# Patient Record
Sex: Male | Born: 1935 | Race: Black or African American | Hispanic: No | Marital: Married | Smoking: Former smoker
Health system: Southern US, Community
[De-identification: ages and names within clinical notes are randomized; demographics above are authoritative.]

## PROBLEM LIST (undated history)

## (undated) DIAGNOSIS — R51 Headache: Secondary | ICD-10-CM

## (undated) DIAGNOSIS — I2510 Atherosclerotic heart disease of native coronary artery without angina pectoris: Secondary | ICD-10-CM

## (undated) DIAGNOSIS — R519 Headache, unspecified: Secondary | ICD-10-CM

## (undated) DIAGNOSIS — E119 Type 2 diabetes mellitus without complications: Secondary | ICD-10-CM

## (undated) DIAGNOSIS — I447 Left bundle-branch block, unspecified: Secondary | ICD-10-CM

## (undated) DIAGNOSIS — I10 Essential (primary) hypertension: Secondary | ICD-10-CM

## (undated) DIAGNOSIS — E785 Hyperlipidemia, unspecified: Secondary | ICD-10-CM

## (undated) DIAGNOSIS — M1990 Unspecified osteoarthritis, unspecified site: Secondary | ICD-10-CM

## (undated) DIAGNOSIS — I739 Peripheral vascular disease, unspecified: Secondary | ICD-10-CM

## (undated) HISTORY — DX: Peripheral vascular disease, unspecified: I73.9

## (undated) HISTORY — DX: Left bundle-branch block, unspecified: I44.7

## (undated) HISTORY — PX: CORONARY ANGIOPLASTY WITH STENT PLACEMENT: SHX49

## (undated) HISTORY — PX: BACK SURGERY: SHX140

## (undated) HISTORY — DX: Atherosclerotic heart disease of native coronary artery without angina pectoris: I25.10

## (undated) HISTORY — DX: Hyperlipidemia, unspecified: E78.5

## (undated) HISTORY — DX: Essential (primary) hypertension: I10

## (undated) HISTORY — PX: CATARACT EXTRACTION W/ INTRAOCULAR LENS  IMPLANT, BILATERAL: SHX1307

---

## 2008-03-10 ENCOUNTER — Ambulatory Visit: Payer: Self-pay | Admitting: Gastroenterology

## 2008-03-24 ENCOUNTER — Ambulatory Visit: Payer: Self-pay | Admitting: Gastroenterology

## 2008-03-24 ENCOUNTER — Encounter: Payer: Self-pay | Admitting: Gastroenterology

## 2008-03-28 ENCOUNTER — Encounter: Payer: Self-pay | Admitting: Gastroenterology

## 2009-12-25 ENCOUNTER — Ambulatory Visit (HOSPITAL_COMMUNITY): Admission: RE | Admit: 2009-12-25 | Discharge: 2009-12-25 | Payer: Self-pay | Admitting: Family Medicine

## 2010-01-01 HISTORY — PX: LUMBAR LAMINECTOMY/DECOMPRESSION MICRODISCECTOMY: SHX5026

## 2010-01-23 ENCOUNTER — Inpatient Hospital Stay (HOSPITAL_COMMUNITY): Admission: RE | Admit: 2010-01-23 | Discharge: 2010-01-24 | Payer: Self-pay | Admitting: Neurosurgery

## 2010-08-17 LAB — URINALYSIS, ROUTINE W REFLEX MICROSCOPIC
Protein, ur: NEGATIVE mg/dL
Specific Gravity, Urine: 1.025 (ref 1.005–1.030)

## 2010-08-17 LAB — COMPREHENSIVE METABOLIC PANEL
ALT: 18 U/L (ref 0–53)
AST: 25 U/L (ref 0–37)
Albumin: 4.3 g/dL (ref 3.5–5.2)
BUN: 19 mg/dL (ref 6–23)
CO2: 29 mEq/L (ref 19–32)
Calcium: 9.9 mg/dL (ref 8.4–10.5)
GFR calc non Af Amer: >60 mL/min (ref >60)
Glucose, Bld: 116 mg/dL — ABNORMAL HIGH (ref 70–99)
Potassium: 4.7 mEq/L (ref 3.5–5.1)
Total Bilirubin: 1 mg/dL (ref 0.3–1.2)

## 2010-08-17 LAB — CBC
HCT: 41.9 % (ref 39.0–52.0)
MCHC: 32.7 g/dL (ref 30.0–36.0)
Platelets: 389 10*3/uL (ref 150–400)
RBC: 4.59 MIL/uL (ref 4.22–5.81)
WBC: 5.8 10*3/uL (ref 4.0–10.5)

## 2010-08-17 LAB — DIFFERENTIAL
Monocytes Relative: 9 % (ref 3–12)
Neutrophils Relative %: 57 % (ref 43–77)

## 2010-08-21 ENCOUNTER — Ambulatory Visit (HOSPITAL_COMMUNITY)
Admission: RE | Admit: 2010-08-21 | Discharge: 2010-08-22 | Disposition: A | Discharge status: Home / Self Care | Payer: Medicare Other | Source: Ambulatory Visit | Attending: Cardiology | Admitting: Cardiology

## 2010-08-21 DIAGNOSIS — I2510 Atherosclerotic heart disease of native coronary artery without angina pectoris: Secondary | ICD-10-CM | POA: Insufficient documentation

## 2010-08-21 DIAGNOSIS — I10 Essential (primary) hypertension: Secondary | ICD-10-CM | POA: Insufficient documentation

## 2010-08-21 DIAGNOSIS — E785 Hyperlipidemia, unspecified: Secondary | ICD-10-CM | POA: Insufficient documentation

## 2010-08-21 LAB — POCT ACTIVATED CLOTTING TIME: Activated Clotting Time: 452 seconds

## 2010-08-22 ENCOUNTER — Encounter: Payer: Self-pay | Admitting: Nurse Practitioner

## 2010-08-22 DIAGNOSIS — I739 Peripheral vascular disease, unspecified: Secondary | ICD-10-CM

## 2010-08-22 DIAGNOSIS — E785 Hyperlipidemia, unspecified: Secondary | ICD-10-CM

## 2010-08-22 DIAGNOSIS — I2510 Atherosclerotic heart disease of native coronary artery without angina pectoris: Secondary | ICD-10-CM

## 2010-08-22 DIAGNOSIS — I10 Essential (primary) hypertension: Secondary | ICD-10-CM | POA: Insufficient documentation

## 2010-08-22 LAB — BASIC METABOLIC PANEL
CO2: 27 mEq/L (ref 19–32)
Creatinine, Ser: 1.11 mg/dL (ref 0.4–1.5)
GFR calc Af Amer: >60 mL/min (ref >60)
Glucose, Bld: 115 mg/dL — ABNORMAL HIGH (ref 70–99)
Sodium: 138 mEq/L (ref 135–145)

## 2010-08-22 LAB — CBC
HCT: 35.8 % — ABNORMAL LOW (ref 39.0–52.0)
MCH: 29.2 pg (ref 26.0–34.0)
MCV: 90.9 fL (ref 78.0–100.0)
Platelets: 367 10*3/uL (ref 150–400)
RBC: 3.94 MIL/uL — ABNORMAL LOW (ref 4.22–5.81)
RDW: 13.1 % (ref 11.5–15.5)

## 2010-08-27 NOTE — Discharge Summary (Signed)
  NAME:  Joseph, Byrd           ACCOUNT NO.:  [id number]  MEDICAL RECORD NO.:  [id number]           PATIENT TYPE:  O  LOCATION:  6526                         FACILITY:  [facility]  PHYSICIAN:  Cristy Hilts. Jacinto Halim, MD       DATE OF BIRTH:  Sep 30, 1933  DATE OF ADMISSION:  08/21/2010 DATE OF DISCHARGE:  08/22/2010                              DISCHARGE SUMMARY   DISCHARGE DIAGNOSES: 1. Coronary artery disease status post PTCA and successful stenting of     the mid LAD with implantation of a 2.25 x 22 mm Resolute drug-     eluting stent with  reduction of stenosis from 90% to 0%.  Residual     diffuse severe disease in the dominant distal circumflex coronary     artery.  Small vessel distally with diffuse disease medical therapy     indicated.  No significant disease in this ostial LAD by IVUS.     Preserved normal left ventricular systolic function. 2  Hypertension. 1. Mixed hyperlipidemia.  RECOMMENDATIONS:  The patient will be discharged home today as he has remained stable and is doing well.  His discharge medications will include: 1. Aspirin 81 mg p.o. daily. 2. Tramadol 50 mg p.o. q.6 h. p.r.n. 3. Niacin 500 mg p.o. at bedtime. 4. Lipitor 20 mg p.o. daily. 5. Imdur 60 mg p.o. daily. 6. Prasugrel 10 mg p.o. daily. 7. Diovan HCT 320/25 one p.o. daily. 8. He will stop Aleve and Naprosyn at least for a duration of 3     months.  The patient will follow up with me in 2-3 weeks, and he will continue to follow with Dr. Rudi Heap for his primary care needs.  BRIEF HISTORY:  [name] is a pleasant 77 year old gentleman who is referred to me for evaluation of chest pain and markedly abnormal treadmill stress test.  He underwent cardiac catheterization yesterday that is August 21, 2010, and was found to have high-grade stenosis in the mid LAD.  IVUS was performed to exclude the severe disease in his ostial LAD.  He underwent successful stenting of the mid LAD.  He  tolerated the procedure well without any significant complication.  Next day, he was found to be stable for discharge.     Cristy Hilts. Jacinto Halim, MD     JRG/MEDQ  D:  08/22/2010  T:  08/23/2010  Job:  [id number]  cc:   Ernestina Penna, M.D.  Electronically Signed by Yates Decamp MD on 08/27/2010 09:58:24 AM

## 2010-08-27 NOTE — Procedures (Signed)
NAME:  Joseph Byrd, Joseph Byrd           ACCOUNT NO.:  [id number]  MEDICAL RECORD NO.:  [id number]           PATIENT TYPE:  O  LOCATION:  [facility]                         FACILITY:  [facility]  PHYSICIAN:  Vonna Kotyk R. Jacinto Halim, MD       DATE OF BIRTH:  [birth year]  DATE OF PROCEDURE:  08/21/2010 DATE OF DISCHARGE:                           CARDIAC CATHETERIZATION   PROCEDURE PERFORMED:  Elective left heart catheterization including; 1. Left ventriculography. 2. Selective right and left coronary aortography. 3. Intravascular ultrasound interrogation of the ostial proximal left     anterior descending. 4. Percutaneous transluminal coronary angioplasty and stenting of the     mid left anterior descending with implantation of a 2.25 x 22 mm     Resolute drug-eluting stent.  INDICATION:  [name] is a 77 year old gentleman with history of hypertension and hyperlipidemia who has been complaining of exertional chest discomfort.  He had undergone outpatient treadmill exercise stress test by Dr. Rudi Heap and was found to have significant ST depression with low level of exercise activity associated with chest discomfort and development of left bundle-branch block. Given the abnormal treadmill stress test and his chest pain suggestive of angina pectoris, he is now brought to the cardiac catheterization lab to evaluate his coronary anatomy.  HEMODYNAMIC DATA:  The left ventricular pressure was 122/10 with end- diastolic pressure of 0 mmHg.  Aortic pressure was 137/76 with a mean of 99 mmHg.  There was no pressure gradient cross the aortic valve.  ANGIOGRAPHIC DATA:  Left ventricle.  Left ventricular systolic function was normal with the ejection fraction of 60%. Right coronary artery.  The right coronary artery has an anterior origin.  It is nondominant. Left main coronary artery.  The left main coronary artery is a large- caliber vessel and has mild diffuse luminal irregularity. Circumflex  coronary artery.  The circumflex coronary artery is a dominant vessel giving origin to a large obtuse marginal 1 and distally it gives origin to PDA, which is severely diffusely diseased constituting anywhere from 50-80% stenoses.  The distal circumflex rapidly tapers off.  The obtuse marginal shows mild diffuse disease. Ramus intermediate.  The ramus intermedius is a moderate-to-large caliber vessel.  Mid segment of the ramus intermediate bifurcates and the second branch has a 40-50% stenoses, which appears to be eccentric. LAD.  The LAD is a large-caliber vessel with severe diffuse calcification of the proximal segment.  Ostium of the LAD appears to be hazy with a 60-70% stenoses or higher in certain views.  The mid LAD shows diffuse luminal irregularity followed by a high-grade 80-90% stenoses.  INTERVENTION DATA:  IVUS:  The IVUS interrogation of the proximal LAD revealed lumen area to be about close to 8-9 mm sq.  At the ostium, at the tightest stenotic segment, the lumen area measured 5 mm sq.  This constituted approximately about 50% stenoses of the ostium of the LAD. There was circumferential calcification noted constituting at least more than 60-70 degrees of calcification throughout the proximal and ostial LAD.  IVUS data post intervention to the mid LAD after stent implantation revealed excellent position of the stent.  Intervention  data of successful PTCA and stenting of the mid LAD with implantation of a 2.24 x 22 mm, there was a looped stent, which was deployed at 10 atmospheric pressure for 60 seconds followed by post dilatation with a 2.25 x 12 mm Sprinter Williams at 16 atmospheric pressure from 30-40 seconds.  Multiple inflations were performed throughout the stented segment.  Following this,  intravascular ultrasound interrogation revealed excellent wall apposition of the stent.  There were no residual stenoses.  Severe calcification and plaque burden was evident in this  segment of the LAD.  RECOMMENDATIONS:  The patient will be continued on aggressive risk modification.  He probably will need to change his statin or increase the dose of the statin.  The beta-blockers cannot be used as he is significantly bradycardic with the heart rate around 40s to 50s while in the cath lab.  A total of 185 mL of contrast was utilized for diagnostic angiography and intervention procedure.  TECHNIQUE OF THE PROCEDURE:  Under sterile precautions using a 6-French right radial access, a 6-French TIG #4 catheter was advanced into the ascending aorta, and selective left and right coronary aortography was performed.  Catheter was then pulled out of the body over an exchange length J-wire.  A 5-French pigtail catheter was utilized to perform left ventriculography in the RAO projection.  Catheter exchange was then done over a J-wire.  TECHNIQUE OF INTERVENTION:  Using Angiomax for anticoagulation an XB3.5 LAD guide catheter was utilized to engage the left main coronary artery. Using a Cougar guidewire, I was able to cross the proximal LAD. However, I had great difficulty in crossing the mid LAD stenoses, hence the lesion was not crossed for IVUS.  Proximal LAD and left main coronary artery IVUS was performed using Galaxy IVUS catheter using Angiomax for anticoagulation.  Having performed and confirmed that the LAD ostial stenosis is not significant, we proceeded with intervention and angioplasty of the mid LAD.  A 2.0 x 12 mm Sprinter balloon was utilized and with the help of a backup support with again moderate amount of difficulty, I was able to cross the mid LAD stenosis.  Having crossed this, then I used the Sprinter balloon to perform angioplasty at 9 atmospheric  pressure for about 1 minute.  Having performed this, we stented this with a 2.24 x 22 mm Resolute stent, which was deployed at 10 atmospheric pressure and postdilatation was performed with a 2.25 x 12 mm  Sprinter  with a peak of 16 atmospheric pressure.  Postdilatation was performed throughout the stented segment.  Post stent implantation, IVUS confirmed good results. The guidewire was withdrawn angiography.  Guide catheter disengaged and pulled out of the body.  During the procedure, intracoronary nitroglycerin was administered at various levels pre and post intervention.  The patient tolerated the procedure well.     Cristy Hilts. Jacinto Halim, MD     JRG/MEDQ  D:  08/21/2010  T:  08/21/2010  Job:  [id number]  cc:   Ernestina Penna, M.D.  Electronically Signed by Yates Decamp MD on 08/27/2010 09:58:17 AM

## 2012-09-03 ENCOUNTER — Ambulatory Visit (INDEPENDENT_AMBULATORY_CARE_PROVIDER_SITE_OTHER): Payer: Medicare Other | Admitting: Family Medicine

## 2012-09-03 ENCOUNTER — Encounter: Payer: Self-pay | Admitting: Family Medicine

## 2012-09-03 VITALS — BP 135/80 | HR 53 | Temp 97.3°F | Ht 69.0 in | Wt 195.4 lb

## 2012-09-03 DIAGNOSIS — I739 Peripheral vascular disease, unspecified: Secondary | ICD-10-CM

## 2012-09-03 DIAGNOSIS — I10 Essential (primary) hypertension: Secondary | ICD-10-CM

## 2012-09-03 DIAGNOSIS — I2510 Atherosclerotic heart disease of native coronary artery without angina pectoris: Secondary | ICD-10-CM

## 2012-09-03 DIAGNOSIS — R739 Hyperglycemia, unspecified: Secondary | ICD-10-CM

## 2012-09-03 DIAGNOSIS — E785 Hyperlipidemia, unspecified: Secondary | ICD-10-CM

## 2012-09-03 DIAGNOSIS — R7309 Other abnormal glucose: Secondary | ICD-10-CM

## 2012-09-03 LAB — BASIC METABOLIC PANEL WITH GFR
BUN: 16 mg/dL (ref 6–23)
CO2: 29 mEq/L (ref 19–32)
Calcium: 9.3 mg/dL (ref 8.4–10.5)
Chloride: 108 mEq/L (ref 96–112)
Creat: 1.05 mg/dL (ref 0.50–1.35)
GFR, Est African American: 79 mL/min
GFR, Est Non African American: 69 mL/min
Glucose, Bld: 116 mg/dL — ABNORMAL HIGH (ref 70–99)
Potassium: 4 mEq/L (ref 3.5–5.3)
Sodium: 141 mEq/L (ref 135–145)

## 2012-09-03 LAB — HEPATIC FUNCTION PANEL
ALT: 15 U/L (ref 0–53)
AST: 29 U/L (ref 0–37)
Albumin: 4.4 g/dL (ref 3.5–5.2)
Alkaline Phosphatase: 61 U/L (ref 39–117)
Bilirubin, Direct: 0.2 mg/dL (ref 0.0–0.3)
Indirect Bilirubin: 0.7 mg/dL (ref 0.0–0.9)
Total Bilirubin: 0.9 mg/dL (ref 0.3–1.2)
Total Protein: 6.6 g/dL (ref 6.0–8.3)

## 2012-09-03 LAB — POCT GLYCOSYLATED HEMOGLOBIN (HGB A1C): Hemoglobin A1C: 5

## 2012-09-03 NOTE — Progress Notes (Signed)
Patient ID: Joseph Byrd, male   DOB: 11-Jul-1933, 79 y.o.   MRN: [id number] SUBJECTIVE:   HPI: New to me. Came to establish with a new provider in [facility]. Stable. No Chest Pain, no Claudication, No SOB, no Pedal edema, No palpitations. BP tends to be 130/80. No Headache. Tries to walk for exercise. Tries to eat healthy. Limits meat.   PMH/PSH: reviewed/updated in Epic  SH/FH: reviewed/updated in Epic  Allergies: reviewed/updated in Epic  Medications: reviewed/updated in Epic  Immunizations: reviewed/updated in Epic    ROS: No Complaints.  OBJECTIVE:   African American Male.  On examination he appeared in good health and spirits. No distress. Anicteric, Acyanotic Vital signs as documented. BP 135/80  Pulse 53  Temp(Src) 97.3 F (36.3 C) (Oral)  Ht 5\' 9"  (1.753 m)  Wt 195 lb 6.4 oz (88.633 kg)  BMI 28.84 kg/m2  SpO2 95%  Skin warm and dry and without overt rashes.  Head,Ears,Eyes,Throat: normal Neck without JVD.  Lungs clear.  Heart exam notable for regular rhythm, normal sounds and absence of murmurs, rubs or gallops. Abdomen unremarkable and without evidence of organomegaly, masses, or abdominal aortic enlargement. NF:[id number] Extremities nonedematous.Peripheral pulses faint in the legs. Neurologic:nonfocal  ASSESSMENT:  HLD (hyperlipidemia) - Plan: NMR Lipoprofile with Lipids, Hepatic function panel  HTN (hypertension) - Plan: BASIC METABOLIC PANEL WITH GFR  Hyperglycemia - Plan: POCT glycosylated hemoglobin (Hb A1C)  Hypertension, benign essential, age 30-18  PVD (peripheral vascular disease) with claudication  CAD, multiple vessel  Hyperlipidemia LDL goal < 70  Discusses medical problems. They're stable at present. No symptomatology. Also, counseled on a healthy lifestyle and being on a vegan diet.  PLAN: Orders Placed This Encounter  Procedures  . BASIC METABOLIC PANEL WITH GFR  . NMR Lipoprofile with Lipids  . Hepatic function panel  . POCT  glycosylated hemoglobin (Hb A1C)   No results found for this or any previous visit (from the past 24 hour(s)).                                Meds ordered this encounter  Medications  . carvedilol (COREG) 3.125 MG tablet    Sig:   . cilostazol (PLETAL) 100 MG tablet    Sig:   . clopidogrel (PLAVIX) 75 MG tablet    Sig:   . isosorbide mononitrate (IMDUR) 60 MG 24 hr tablet    Sig:   . TRAVATAN Z 0.004 % SOLN ophthalmic solution    Sig:   Diet and Exercise discussed with patient. For nutrition information, I recommend books: Eat to Live by Dr Monico Hoar. Prevent and Reverse Heart Disease by Dr Suzzette Righter.  Exercise recommendations are:  If unable to walk, then the patient can exercise in a chair 3 times a day. By flapping arms like a bird gently and raising legs outwards to the front.  If ambulatory, the patient can go for walks for 30 minutes 3 times a week. Then increase the intensity and duration as tolerated. Goal is to try to attain exercise frequency to 5 times a week. Best to perform resistance exercises 2 days a week and cardio type exercises 3 days per week. Reviewed his chart. Since he is a new patient to me. Next, appointment in 4 months. Reviewed meds.  Edell Mesenbrink P. Modesto Charon, M.D.

## 2012-09-03 NOTE — Patient Instructions (Addendum)
Diet and Exercise discussed with patient. For nutrition information, I recommend books: Eat to Live by Dr Monico Hoar. Prevent and Reverse Heart Disease by Dr Suzzette Righter.  Exercise recommendations are:  If unable to walk, then the patient can exercise in a chair 3 times a day. By flapping arms like a bird gently and raising legs outwards to the front.  If ambulatory, the patient can go for walks for 30 minutes 3 times a week. Then increase the intensity and duration as tolerated. Goal is to try to attain exercise frequency to 5 times a week. Best to perform resistance exercises 2 days a week and cardio type exercises 3 days per week.  Coronary Artery Disease Coronary artery disease (CAD) happens when the arteries of the heart become narrow and hardened. Buildup of plaque in the walls of the vessels blocks blood flow. This can cause minor chest pain (angina) if the blockage is partial. A heart attack or MI (myocardial infarction) happens when the artery is completely blocked. MIs can lead to shock, heart failure, and sudden death. CAD is the leading cause of death in men and women.  CAUSES  The risk for getting CAD can be inherited. There are other risk factors that can be helped. These factors include:  Cigarette smoking.   Diabetes.   Cholesterol.   High blood pressure.  Being overweight and not exercising regularly also increase your risk for having CAD.  SYMPTOMS  Chest pain.   Shortness of breath.   Weakness.   Nausea.   Fainting.  DIAGNOSIS  The diagnosis may require:  An EKG.   Stress test.   Chest X-ray.   Cardiac scan.   Echocardiogram.   A coronary angiogram.  TREATMENT  Treatment includes immediate measures for symptom relief. Medicines for chest pain, cholesterol reduction, blood pressure control, and aspirin to prevent clotting may be needed. Coronary angioplasty (using a balloon to open a blockage in a coronary artery) is helpful in managing MIs.  It is also useful for some patients with angina. Losing weight, controlling the blood sugar, and not smoking are also important to long-term success. The optimal diet should emphasize fruits and vegetables. Sodas, deep-fried foods, and sweets should be avoided.  SEEK IMMEDIATE MEDICAL CARE IF:  You develop severe chest pain or pain that does not improve with rest and medicine.   You develop pain that radiates into the arms, neck, jaw, or back.   You develop fainting, shortness of breath, severe palpitations, or vomiting.   You start sweating a lot.  Document Released: 06/27/2004 Document Revised: 05/09/2011 Document Reviewed: 05/18/2008 [facility] Patient Information 2012 [place], [place].   Hypertriglyceridemia  Diet for High blood levels of Triglycerides Most fats in food are triglycerides. Triglycerides in your blood are stored as fat in your body. High levels of triglycerides in your blood may put you at a greater risk for heart disease and stroke.  Normal triglyceride levels are less than 150 mg/dL. Borderline high levels are 150-199 mg/dl. High levels are 200 - 499 mg/dL, and very high triglyceride levels are greater than 500 mg/dL. The decision to treat high triglycerides is generally based on the level. For people with borderline or high triglyceride levels, treatment includes weight loss and exercise. Drugs are recommended for people with very high triglyceride levels. Many people who need treatment for high triglyceride levels have metabolic syndrome. This syndrome is a collection of disorders that often include: insulin resistance, high blood pressure, blood clotting problems, high cholesterol and  triglycerides. TESTING PROCEDURE FOR TRIGLYCERIDES  You should not eat 4 hours before getting your triglycerides measured. The normal range of triglycerides is between 10 and 250 milligrams per deciliter (mg/dl). Some people may have extreme levels (1000 or above), but your triglyceride level  may be too high if it is above 150 mg/dl, depending on what other risk factors you have for heart disease.  People with high blood triglycerides may also have high blood cholesterol levels. If you have high blood cholesterol as well as high blood triglycerides, your risk for heart disease is probably greater than if you only had high triglycerides. High blood cholesterol is one of the main risk factors for heart disease. CHANGING YOUR DIET  Your weight can affect your blood triglyceride level. If you are more than 20% above your ideal body weight, you may be able to lower your blood triglycerides by losing weight. Eating less and exercising regularly is the best way to combat this. Fat provides more calories than any other food. The best way to lose weight is to eat less fat. Only 30% of your total calories should come from fat. Less than 7% of your diet should come from saturated fat. A diet low in fat and saturated fat is the same as a diet to decrease blood cholesterol. By eating a diet lower in fat, you may lose weight, lower your blood cholesterol, and lower your blood triglyceride level.  Eating a diet low in fat, especially saturated fat, may also help you lower your blood triglyceride level. Ask your dietitian to help you figure how much fat you can eat based on the number of calories your caregiver has prescribed for you.  Exercise, in addition to helping with weight loss may also help lower triglyceride levels.   Alcohol can increase blood triglycerides. You may need to stop drinking alcoholic beverages.  Too much carbohydrate in your diet may also increase your blood triglycerides. Some complex carbohydrates are necessary in your diet. These may include bread, rice, potatoes, other starchy vegetables and cereals.  Reduce "simple" carbohydrates. These may include pure sugars, candy, honey, and jelly without losing other nutrients. If you have the kind of high blood triglycerides that is  affected by the amount of carbohydrates in your diet, you will need to eat less sugar and less high-sugar foods. Your caregiver can help you with this.  Adding 2-4 grams of fish oil (EPA+ DHA) may also help lower triglycerides. Speak with your caregiver before adding any supplements to your regimen. Following the Diet  Maintain your ideal weight. Your caregivers can help you with a diet. Generally, eating less food and getting more exercise will help you lose weight. Joining a weight control group may also help. Ask your caregivers for a good weight control group in your area.  Eat low-fat foods instead of high-fat foods. This can help you lose weight too.  These foods are lower in fat. Eat MORE of these:   Dried beans, peas, and lentils.  Egg whites.  Low-fat cottage cheese.  Fish.  Lean cuts of meat, such as round, sirloin, rump, and flank (cut extra fat off meat you fix).  Whole grain breads, cereals and pasta.  Skim and nonfat dry milk.  Low-fat yogurt.  Poultry without the skin.  Cheese made with skim or part-skim milk, such as mozzarella, parmesan, farmers', ricotta, or pot cheese. These are higher fat foods. Eat LESS of these:   Whole milk and foods made from whole milk, such  as American, blue, cheddar, monterey jack, and swiss cheese  High-fat meats, such as luncheon meats, sausages, knockwurst, bratwurst, hot dogs, ribs, corned beef, ground pork, and regular ground beef.  Fried foods. Limit saturated fats in your diet. Substituting unsaturated fat for saturated fat may decrease your blood triglyceride level. You will need to read package labels to know which products contain saturated fats.  These foods are high in saturated fat. Eat LESS of these:   Fried pork skins.  Whole milk.  Skin and fat from poultry.  Palm oil.  Butter.  Shortening.  Cream cheese.  Tomasa Blase.  Margarines and baked goods made from listed oils.  Vegetable  shortenings.  Chitterlings.  Fat from meats.  Coconut oil.  Palm kernel oil.  Lard.  Cream.  Sour cream.  Fatback.  Coffee whiteners and non-dairy creamers made with these oils.  Cheese made from whole milk. Use unsaturated fats (both polyunsaturated and monounsaturated) moderately. Remember, even though unsaturated fats are better than saturated fats; you still want a diet low in total fat.  These foods are high in unsaturated fat:   Canola oil.  Sunflower oil.  Mayonnaise.  Almonds.  Peanuts.  Pine nuts.  Margarines made with these oils.  Safflower oil.  Olive oil.  Avocados.  Cashews.  Peanut butter.  Sunflower seeds.  Soybean oil.  Peanut oil.  Olives.  Pecans.  Walnuts.  Pumpkin seeds. Avoid sugar and other high-sugar foods. This will decrease carbohydrates without decreasing other nutrients. Sugar in your food goes rapidly to your blood. When there is excess sugar in your blood, your liver may use it to make more triglycerides. Sugar also contains calories without other important nutrients.  Eat LESS of these:   Sugar, brown sugar, powdered sugar, jam, jelly, preserves, honey, syrup, molasses, pies, candy, cakes, cookies, frosting, pastries, colas, soft drinks, punches, fruit drinks, and regular gelatin.  Avoid alcohol. Alcohol, even more than sugar, may increase blood triglycerides. In addition, alcohol is high in calories and low in nutrients. Ask for sparkling water, or a diet soft drink instead of an alcoholic beverage. Suggestions for planning and preparing meals   Bake, broil, grill or roast meats instead of frying.  Remove fat from meats and skin from poultry before cooking.  Add spices, herbs, lemon juice or vinegar to vegetables instead of salt, rich sauces or gravies.  Use a non-stick skillet without fat or use no-stick sprays.  Cool and refrigerate stews and broth. Then remove the hardened fat floating on the surface before  serving.  Refrigerate meat drippings and skim off fat to make low-fat gravies.  Serve more fish.  Use less butter, margarine and other high-fat spreads on bread or vegetables.  Use skim or reconstituted non-fat dry milk for cooking.  Cook with low-fat cheeses.  Substitute low-fat yogurt or cottage cheese for all or part of the sour cream in recipes for sauces, dips or congealed salads.  Use half yogurt/half mayonnaise in salad recipes.  Substitute evaporated skim milk for cream. Evaporated skim milk or reconstituted non-fat dry milk can be whipped and substituted for whipped cream in certain recipes.  Choose fresh fruits for dessert instead of high-fat foods such as pies or cakes. Fruits are naturally low in fat. When Dining Out   Order low-fat appetizers such as fruit or vegetable juice, pasta with vegetables or tomato sauce.  Select clear, rather than cream soups.  Ask that dressings and gravies be served on the side. Then use less of them.  Order foods that are baked, broiled, poached, steamed, stir-fried, or roasted.  Ask for margarine instead of butter, and use only a small amount.  Drink sparkling water, unsweetened tea or coffee, or diet soft drinks instead of alcohol or other sweet beverages. QUESTIONS AND ANSWERS ABOUT OTHER FATS IN THE BLOOD: SATURATED FAT, TRANS FAT, AND CHOLESTEROL What is trans fat? Trans fat is a type of fat that is formed when vegetable oil is hardened through a process called hydrogenation. This process helps makes foods more solid, gives them shape, and prolongs their shelf life. Trans fats are also called hydrogenated or partially hydrogenated oils.  What do saturated fat, trans fat, and cholesterol in foods have to do with heart disease? Saturated fat, trans fat, and cholesterol in the diet all raise the level of LDL "bad" cholesterol in the blood. The higher the LDL cholesterol, the greater the risk for coronary heart disease (CHD). Saturated  fat and trans fat raise LDL similarly.  What foods contain saturated fat, trans fat, and cholesterol? High amounts of saturated fat are found in animal products, such as fatty cuts of meat, chicken skin, and full-fat dairy products like butter, whole milk, cream, and cheese, and in tropical vegetable oils such as palm, palm kernel, and coconut oil. Trans fat is found in some of the same foods as saturated fat, such as vegetable shortening, some margarines (especially hard or stick margarine), crackers, cookies, baked goods, fried foods, salad dressings, and other processed foods made with partially hydrogenated vegetable oils. Small amounts of trans fat also occur naturally in some animal products, such as milk products, beef, and lamb. Foods high in cholesterol include liver, other organ meats, egg yolks, shrimp, and full-fat dairy products. How can I use the new food label to make heart-healthy food choices? Check the Nutrition Facts panel of the food label. Choose foods lower in saturated fat, trans fat, and cholesterol. For saturated fat and cholesterol, you can also use the Percent Daily Value (%DV): 5% DV or less is low, and 20% DV or more is high. (There is no %DV for trans fat.) Use the Nutrition Facts panel to choose foods low in saturated fat and cholesterol, and if the trans fat is not listed, read the ingredients and limit products that list shortening or hydrogenated or partially hydrogenated vegetable oil, which tend to be high in trans fat. POINTS TO REMEMBER:   Discuss your risk for heart disease with your caregivers, and take steps to reduce risk factors.  Change your diet. Choose foods that are low in saturated fat, trans fat, and cholesterol.  Add exercise to your daily routine if it is not already being done. Participate in physical activity of moderate intensity, like brisk walking, for at least 30 minutes on most, and preferably all days of the week. No time? Break the 30 minutes  into three, 10-minute segments during the day.  Stop smoking. If you do smoke, contact your caregiver to discuss ways in which they can help you quit.  Do not use street drugs.  Maintain a normal weight.  Maintain a healthy blood pressure.  Keep up with your blood work for checking the fats in your blood as directed by your caregiver. Document Released: 03/07/2004 Document Revised: 11/19/2011 Document Reviewed: 10/03/2008   Peripheral Vascular Disease Peripheral Vascular Disease (PVD), also called Peripheral Arterial Disease (PAD), is a circulation problem caused by cholesterol (atherosclerotic plaque) deposits in the arteries. PVD commonly occurs in the lower extremities (legs) but it  can occur in other areas of the body, such as your arms. The cholesterol buildup in the arteries reduces blood flow which can cause pain and other serious problems. The presence of PVD can place a person at risk for Coronary Artery Disease (CAD).  CAUSES  Causes of PVD can be many. It is usually associated with more than one risk factor such as:   High Cholesterol.  Smoking.  Diabetes.  Lack of exercise or inactivity.  High blood pressure (hypertension).  Obesity.  Family history. SYMPTOMS   When the lower extremities are affected, patients with PVD may experience:  Leg pain with exertion or physical activity. This is called INTERMITTENT CLAUDICATION. This may present as cramping or numbness with physical activity. The location of the pain is associated with the level of blockage. For example, blockage at the abdominal level (distal abdominal aorta) may result in buttock or hip pain. Lower leg arterial blockage may result in calf pain.  As PVD becomes more severe, pain can develop with less physical activity.  In people with severe PVD, leg pain may occur at rest.  Other PVD signs and symptoms:  Leg numbness or weakness.  Coldness in the affected leg or foot, especially when compared to  the other leg.  A change in leg color.  Patients with significant PVD are more prone to ulcers or sores on toes, feet or legs. These may take longer to heal or may reoccur. The ulcers or sores can become infected.  If signs and symptoms of PVD are ignored, gangrene may occur. This can result in the loss of toes or loss of an entire limb.  Not all leg pain is related to PVD. Other medical conditions can cause leg pain such as:  Blood clots (embolism) or Deep Vein Thrombosis.  Inflammation of the blood vessels (vasculitis).  Spinal stenosis. DIAGNOSIS  Diagnosis of PVD can involve several different types of tests. These can include:  Pulse Volume Recording Method (PVR). This test is simple, painless and does not involve the use of X-rays. PVR involves measuring and comparing the blood pressure in the arms and legs. An ABI (Ankle-Brachial Index) is calculated. The normal ratio of blood pressures is 1. As this number becomes smaller, it indicates more severe disease.  < 0.95  indicates significant narrowing in one or more leg vessels.  <0.8 there will usually be pain in the foot, leg or buttock with exercise.  <0.4 will usually have pain in the legs at rest.  <0.25  usually indicates limb threatening PVD.  Doppler detection of pulses in the legs. This test is painless and checks to see if you have a pulses in your legs/feet.  A dye or contrast material (a substance that highlights the blood vessels so they show up on x-ray) may be given to help your caregiver better see the arteries for the following tests. The dye is eliminated from your body by the kidney's. Your caregiver may order blood work to check your kidney function and other laboratory values before the following tests are performed:  Magnetic Resonance Angiography (MRA). An MRA is a picture study of the blood vessels and arteries. The MRA machine uses a large magnet to produce images of the blood vessels.  Computed Tomography  Angiography (CTA). A CTA is a specialized x-ray that looks at how the blood flows in your blood vessels. An IV may be inserted into your arm so contrast dye can be injected.  Angiogram. Is a procedure that uses x-rays  to look at your blood vessels. This procedure is minimally invasive, meaning a small incision (cut) is made in your groin. A small tube (catheter) is then inserted into the artery of your groin. The catheter is guided to the blood vessel or artery your caregiver wants to examine. Contrast dye is injected into the catheter. X-rays are then taken of the blood vessel or artery. After the images are obtained, the catheter is taken out. TREATMENT  Treatment of PVD involves many interventions which may include:  Lifestyle changes:  Quitting smoking.  Exercise.  Following a low fat, low cholesterol diet.  Control of diabetes.  Foot care is very important to the PVD patient. Good foot care can help prevent infection.  Medication:  Cholesterol-lowering medicine.  Blood pressure medicine.  Anti-platelet drugs.  Certain medicines may reduce symptoms of Intermittent Claudication.  Interventional/Surgical options:  Angioplasty. An Angioplasty is a procedure that inflates a balloon in the blocked artery. This opens the blocked artery to improve blood flow.  Stent Implant. A wire mesh tube (stent) is placed in the artery. The stent expands and stays in place, allowing the artery to remain open.  Peripheral Bypass Surgery. This is a surgical procedure that reroutes the blood around a blocked artery to help improve blood flow. This type of procedure may be performed if Angioplasty or stent implants are not an option. SEEK IMMEDIATE MEDICAL CARE IF:   You develop pain or numbness in your arms or legs.  Your arm or leg turns cold, becomes blue in color.  You develop redness, warmth, swelling and pain in your arms or legs. MAKE SURE YOU:   Understand these instructions.  Will  watch your condition.  Will get help right away if you are not doing well or get worse. Document Released: 06/27/2004 Document Revised: 08/12/2011 Document Reviewed: 05/24/2008 [facility] Patient Information 2013 [place], [place]. Hypertension As your heart beats, it forces blood through your arteries. This force is your blood pressure. If the pressure is too high, it is called hypertension (HTN) or high blood pressure. HTN is dangerous because you may have it and not know it. High blood pressure may mean that your heart has to work harder to pump blood. Your arteries may be narrow or stiff. The extra work puts you at risk for heart disease, stroke, and other problems.  Blood pressure consists of two numbers, a higher number over a lower, 110/72, for example. It is stated as "110 over 72." The ideal is below 120 for the top number (systolic) and under 80 for the bottom (diastolic). Write down your blood pressure today. You should pay close attention to your blood pressure if you have certain conditions such as:  Heart failure.  Prior heart attack.  Diabetes  Chronic kidney disease.  Prior stroke.  Multiple risk factors for heart disease. To see if you have HTN, your blood pressure should be measured while you are seated with your arm held at the level of the heart. It should be measured at least twice. A one-time elevated blood pressure reading (especially in the Emergency Department) does not mean that you need treatment. There may be conditions in which the blood pressure is different between your right and left arms. It is important to see your caregiver soon for a recheck. Most people have essential hypertension which means that there is not a specific cause. This type of high blood pressure may be lowered by changing lifestyle factors such as:  Stress.  Smoking.  Lack  of exercise.  Excessive weight.  Drug/tobacco/alcohol use.  Eating less salt. Most people do not have symptoms  from high blood pressure until it has caused damage to the body. Effective treatment can often prevent, delay or reduce that damage. TREATMENT  When a cause has been identified, treatment for high blood pressure is directed at the cause. There are a large number of medications to treat HTN. These fall into several categories, and your caregiver will help you select the medicines that are best for you. Medications may have side effects. You should review side effects with your caregiver. If your blood pressure stays high after you have made lifestyle changes or started on medicines,   Your medication(s) may need to be changed.  Other problems may need to be addressed.  Be certain you understand your prescriptions, and know how and when to take your medicine.  Be sure to follow up with your caregiver within the time frame advised (usually within two weeks) to have your blood pressure rechecked and to review your medications.  If you are taking more than one medicine to lower your blood pressure, make sure you know how and at what times they should be taken. Taking two medicines at the same time can result in blood pressure that is too low. SEEK IMMEDIATE MEDICAL CARE IF:  You develop a severe headache, blurred or changing vision, or confusion.  You have unusual weakness or numbness, or a faint feeling.  You have severe chest or abdominal pain, vomiting, or breathing problems. MAKE SURE YOU:   Understand these instructions.  Will watch your condition.  Will get help right away if you are not doing well or get worse. Document Released: 05/20/2005 Document Revised: 08/12/2011 Document Reviewed: 01/08/2008 [facility] Patient Information 2013 [place], [place].  [employer] Patient Information 2013 [place], [place].

## 2012-09-04 LAB — NMR LIPOPROFILE WITH LIPIDS
Cholesterol, Total: 130 mg/dL (ref <200)
HDL Particle Number: 28 umol/L — ABNORMAL LOW (ref >=30.5)
HDL Size: 9.8 nm (ref >=9.2)
HDL-C: 46 mg/dL (ref >=40)
LDL (calc): 71 mg/dL (ref <100)
LDL Particle Number: 656 nmol/L (ref <1000)
LDL Size: 20.2 nm — ABNORMAL LOW (ref >20.5)
LP-IR Score: <25 (ref <=45)
Large HDL-P: 8.4 umol/L (ref >=4.8)
Large VLDL-P: 0.9 nmol/L (ref <=2.7)
Small LDL Particle Number: 348 nmol/L (ref <=527)
Triglycerides: 66 mg/dL (ref <150)
VLDL Size: 44.8 nm (ref <=46.6)

## 2012-09-04 NOTE — Progress Notes (Signed)
Quick Note:  Lab result at goal. No change in Medications for now. ______ 

## 2012-10-23 ENCOUNTER — Encounter: Payer: Self-pay | Admitting: Family Medicine

## 2012-10-23 ENCOUNTER — Ambulatory Visit (INDEPENDENT_AMBULATORY_CARE_PROVIDER_SITE_OTHER): Payer: Medicare Other | Admitting: Family Medicine

## 2012-10-23 VITALS — BP 125/67 | HR 50 | Temp 96.8°F | Ht 69.0 in | Wt 194.0 lb

## 2012-10-23 DIAGNOSIS — R21 Rash and other nonspecific skin eruption: Secondary | ICD-10-CM

## 2012-10-23 DIAGNOSIS — M170 Bilateral primary osteoarthritis of knee: Secondary | ICD-10-CM

## 2012-10-23 DIAGNOSIS — M1710 Unilateral primary osteoarthritis, unspecified knee: Secondary | ICD-10-CM

## 2012-10-23 LAB — POCT SKIN KOH: Skin KOH, POC: NEGATIVE

## 2012-10-23 MED ORDER — KETOCONAZOLE 2 % EX CREA: TOPICAL_CREAM | Freq: Two times a day (BID) | CUTANEOUS | Status: DC

## 2012-10-23 MED ORDER — TRIAMCINOLONE ACETONIDE 0.1 % EX OINT: TOPICAL_OINTMENT | Freq: Two times a day (BID) | CUTANEOUS | Status: DC

## 2012-10-23 NOTE — Progress Notes (Signed)
Patient ID: Joseph Byrd, male   DOB: 06-14-1933, 79 y.o.   MRN: [id number] SUBJECTIVE: HPI:  Has a large rash on the lateral right thigh. Itches at time.  Has arthritis of the knees.wanted to see who he should see. Past Medical History  Diagnosis Date  . Hypertension   . PAD (peripheral artery disease)   . Hyperlipidemia   . CAD (coronary artery disease)   . Cataract    Past Surgical History  Procedure Laterality Date  . Angioplasty lad  03 20 2012  . Back surgery     Current Outpatient Prescriptions on File Prior to Visit  Medication Sig Dispense Refill  . aspirin 81 MG tablet Take 81 mg by mouth daily.        Marland Kitchen atorvastatin (LIPITOR) 20 MG tablet Take 20 mg by mouth daily.        . carvedilol (COREG) 3.125 MG tablet       . cilostazol (PLETAL) 100 MG tablet       . clopidogrel (PLAVIX) 75 MG tablet       . doxazosin (CARDURA) 8 MG tablet Take 8 mg by mouth at bedtime.        . isosorbide mononitrate (IMDUR) 60 MG 24 hr tablet       . niacin (NIASPAN) 500 MG CR tablet Take 500 mg by mouth at bedtime.        . TRAVATAN Z 0.004 % SOLN ophthalmic solution       . valsartan-hydrochlorothiazide (DIOVAN-HCT) 320-25 MG per tablet Take 1 tablet by mouth daily.         No current facility-administered medications on file prior to visit.   No Known Allergies  There is no immunization history on file for this patient. History   Social History  . Marital Status: Married    Spouse Name: N/A    Number of Children: N/A  . Years of Education: N/A   Occupational History  . Not on file.   Social History Main Topics  . Smoking status: Former Smoker    Types: Cigarettes    Quit date: 09/04/1982  . Smokeless tobacco: Not on file  . Alcohol Use: No  . Drug Use: No  . Sexually Active: Not on file   Other Topics Concern  . Not on file   Social History Narrative  . No narrative on file      PMH/PSH: reviewed/updated in Epic  SH/FH: reviewed/updated in  Epic  Allergies: reviewed/updated in Epic  Medications: reviewed/updated in Epic  Immunizations: reviewed/updated in Epic  ROS: As above in the HPI. All other systems are stable or negative.  OBJECTIVE: APPEARANCE:  Patient in no acute distress.The patient appeared well nourished and normally developed. Acyanotic. Waist: VITAL SIGNS:BP 125/67  Pulse 50  Temp(Src) 96.8 F (36 C) (Oral)  Ht 5\' 9"  (1.753 m)  Wt 194 lb (87.998 kg)  BMI 28.64 kg/m2 AA Male  SKIN: warm and  Dry without, tattoos and scars. Large 2 inch circular red scaly lesion with raised margins on the right thigh.  HEAD and Neck: without JVD, Head and scalp: normal Eyes:No scleral icterus. Fundi normal, eye movements normal. Ears: Auricle normal, canal normal, Tympanic membranes normal, insufflation normal. Nose: normal Throat: normal Neck & thyroid: normal  CHEST & LUNGS: Chest wall: normal Lungs: Clear  CVS: Reveals the PMI to be normally located. Regular rhythm, First and Second Heart sounds are normal,  absence of murmurs, rubs or gallops. Peripheral vasculature: Radial pulses: normal  Dorsal pedis pulses: normal Posterior pulses: normal  ABDOMEN:  Appearance: normal Benign,, no organomegaly, no masses, no Abdominal Aortic enlargement. No Guarding , no rebound. No Bruits. Bowel sounds: normal  RECTAL: N/A GU: N/A  EXTREMETIES: nonedematous. Both Femoral and Pedal pulses are normal.  MUSCULOSKELETAL:  Spine: normal Joints: knees crepitus NEUROLOGIC: oriented to time,place and person; nonfocal. Strength is normal Sensory is normal Reflexes are normal Cranial Nerves are normal.  ASSESSMENT: Rash - Plan: POCT Skin KOH, triamcinolone ointment (KENALOG) 0.1 %, ketoconazole (NIZORAL) 2 % cream  Arthritis of both knees - Plan: Ambulatory referral to Orthopedic Surgery  PLAN: Orders Placed This Encounter  Procedures  . Ambulatory referral to Orthopedic Surgery    Referral Priority:   Routine    Referral Type:  Surgical    Referral Reason:  Specialty Services Required    Requested Specialty:  Orthopedic Surgery    Number of Visits Requested:  1  . POCT Skin KOH   Results for orders placed in visit on 10/23/12  POCT SKIN KOH      Result Value Range   Skin KOH, POC Negative     Meds ordered this encounter  Medications  . triamcinolone ointment (KENALOG) 0.1 %    Sig: Apply topically 2 (two) times daily.    Dispense:  30 g    Refill:  0  . ketoconazole (NIZORAL) 2 % cream    Sig: Apply topically 2 (two) times daily.    Dispense:  60 g    Refill:  0   Skin care.  RtC 3 months

## 2012-11-30 ENCOUNTER — Other Ambulatory Visit: Payer: Self-pay

## 2012-11-30 MED ORDER — NIACIN ER (ANTIHYPERLIPIDEMIC) 500 MG PO TBCR: 500.0000 mg | EXTENDED_RELEASE_TABLET | Freq: Every day | ORAL | Status: DC

## 2013-01-05 ENCOUNTER — Other Ambulatory Visit: Payer: Medicare Other

## 2013-01-05 ENCOUNTER — Encounter: Payer: Self-pay | Admitting: Family Medicine

## 2013-01-05 ENCOUNTER — Ambulatory Visit (INDEPENDENT_AMBULATORY_CARE_PROVIDER_SITE_OTHER): Payer: Medicare Other | Admitting: Family Medicine

## 2013-01-05 VITALS — BP 148/84 | HR 71 | Temp 97.8°F | Ht 69.0 in | Wt 195.4 lb

## 2013-01-05 DIAGNOSIS — R739 Hyperglycemia, unspecified: Secondary | ICD-10-CM | POA: Insufficient documentation

## 2013-01-05 DIAGNOSIS — I10 Essential (primary) hypertension: Secondary | ICD-10-CM

## 2013-01-05 DIAGNOSIS — I739 Peripheral vascular disease, unspecified: Secondary | ICD-10-CM

## 2013-01-05 DIAGNOSIS — I2510 Atherosclerotic heart disease of native coronary artery without angina pectoris: Secondary | ICD-10-CM

## 2013-01-05 DIAGNOSIS — R7309 Other abnormal glucose: Secondary | ICD-10-CM

## 2013-01-05 DIAGNOSIS — M1710 Unilateral primary osteoarthritis, unspecified knee: Secondary | ICD-10-CM

## 2013-01-05 DIAGNOSIS — E785 Hyperlipidemia, unspecified: Secondary | ICD-10-CM

## 2013-01-05 DIAGNOSIS — M170 Bilateral primary osteoarthritis of knee: Secondary | ICD-10-CM

## 2013-01-05 LAB — POCT GLYCOSYLATED HEMOGLOBIN (HGB A1C): Hemoglobin A1C: 7.8

## 2013-01-05 MED ORDER — TRIAMCINOLONE ACETONIDE 40 MG/ML IJ SUSP: 40.0000 mg | Freq: Once | INTRAMUSCULAR | Status: DC

## 2013-01-05 MED ORDER — BUPIVACAINE HCL 0.25 % IJ SOLN: 0.5000 mL | Freq: Once | INTRAMUSCULAR | Status: DC

## 2013-01-05 MED ORDER — LIDOCAINE HCL 2 % IJ SOLN: 1.0000 mL | Freq: Once | INTRAMUSCULAR | Status: DC

## 2013-01-05 NOTE — Progress Notes (Signed)
Patient ID: Joseph Byrd, male   DOB: [birth year], 79 y.o.   MRN: [id number] SUBJECTIVE: CC: Chief Complaint  Patient presents with  . Follow-up    4 month fastng    HPI: Patient is here for follow up of hyperlipidemia: Has been working in the garden. Walks a lot.  denies Headache;denies Chest Pain;denies weakness;denies Shortness of Breath and orthopnea;denies Visual changes;denies palpitations;denies cough;denies pedal edema;denies symptoms of TIA or stroke;deniesClaudication symptoms. admits to Compliance with medications; denies Problems with medications.  Right knee bothers him a bit and  Swells every now and then.  Past Medical History  Diagnosis Date  . Hypertension   . PAD (peripheral artery disease)   . Hyperlipidemia   . CAD (coronary artery disease)   . Cataract    Past Surgical History  Procedure Laterality Date  . Angioplasty lad  03 20 2012  . Back surgery     History   Social History  . Marital Status: Married    Spouse Name: N/A    Number of Children: N/A  . Years of Education: N/A   Occupational History  . Not on file.   Social History Main Topics  . Smoking status: Former Smoker    Types: Cigarettes    Quit date: 09/04/1982  . Smokeless tobacco: Not on file  . Alcohol Use: No  . Drug Use: No  . Sexually Active: Not on file   Other Topics Concern  . Not on file   Social History Narrative  . No narrative on file   No family history on file. Current Outpatient Prescriptions on File Prior to Visit  Medication Sig Dispense Refill  . aspirin 81 MG tablet Take 81 mg by mouth daily.        Marland Kitchen atorvastatin (LIPITOR) 20 MG tablet Take 20 mg by mouth daily.        . carvedilol (COREG) 3.125 MG tablet Take 3.125 mg by mouth 2 (two) times daily with a meal.       . cilostazol (PLETAL) 100 MG tablet Take 100 mg by mouth 2 (two) times daily.       . clopidogrel (PLAVIX) 75 MG tablet Take 75 mg by mouth daily.       Marland Kitchen doxazosin (CARDURA) 8 MG  tablet Take 8 mg by mouth at bedtime.        . isosorbide mononitrate (IMDUR) 60 MG 24 hr tablet       . niacin (NIASPAN) 500 MG CR tablet Take 1 tablet (500 mg total) by mouth at bedtime.  30 tablet  3  . TRAVATAN Z 0.004 % SOLN ophthalmic solution       . valsartan-hydrochlorothiazide (DIOVAN-HCT) 320-25 MG per tablet Take 1 tablet by mouth daily.         No current facility-administered medications on file prior to visit.   No Known Allergies  There is no immunization history on file for this patient. Prior to Admission medications   Medication Sig Start Date End Date Taking? Authorizing Provider  aspirin 81 MG tablet Take 81 mg by mouth daily.     Yes Historical Provider, MD  atorvastatin (LIPITOR) 20 MG tablet Take 20 mg by mouth daily.     Yes Historical Provider, MD  carvedilol (COREG) 3.125 MG tablet Take 3.125 mg by mouth 2 (two) times daily with a meal.  09/02/12  Yes Historical Provider, MD  cilostazol (PLETAL) 100 MG tablet Take 100 mg by mouth 2 (two) times daily.  08/02/12  Yes Historical Provider, MD  clopidogrel (PLAVIX) 75 MG tablet Take 75 mg by mouth daily.  07/29/12  Yes Historical Provider, MD  doxazosin (CARDURA) 8 MG tablet Take 8 mg by mouth at bedtime.     Yes Historical Provider, MD  isosorbide mononitrate (IMDUR) 60 MG 24 hr tablet  08/30/12  Yes Historical Provider, MD  niacin (NIASPAN) 500 MG CR tablet Take 1 tablet (500 mg total) by mouth at bedtime. 11/30/12  Yes Ileana Ladd, MD  TRAVATAN Z 0.004 % SOLN ophthalmic solution  07/06/12  Yes Historical Provider, MD  valsartan-hydrochlorothiazide (DIOVAN-HCT) 320-25 MG per tablet Take 1 tablet by mouth daily.     Yes Historical Provider, MD     ROS: As above in the HPI. All other systems are stable or negative.  OBJECTIVE: APPEARANCE:  Patient in no acute distress.The patient appeared well nourished and normally developed. Acyanotic. Waist: VITAL SIGNS:BP 148/84  Pulse 71  Temp(Src) 97.8 F (36.6 C) (Oral)   Ht 5\' 9"  (1.753 m)  Wt 195 lb 6.4 oz (88.633 kg)  BMI 28.84 kg/m2  AAM  SKIN: warm and  Dry without overt rashes, tattoos and scars  HEAD and Neck: without JVD, Head and scalp: normal Eyes:No scleral icterus. Fundi normal, eye movements normal. Ears: Auricle normal, canal normal, Tympanic membranes normal, insufflation normal. Nose: normal Throat: normal Neck & thyroid: normal  CHEST & LUNGS: Chest wall: normal Lungs: Clear  CVS: Reveals the PMI to be normally located. Regular rhythm, First and Second Heart sounds are normal,  absence of murmurs, rubs or gallops. Peripheral vasculature: Radial pulses: normal Dorsal pedis pulses: normal Posterior pulses: normal  ABDOMEN:  Appearance: normal Benign, no organomegaly, no masses, no Abdominal Aortic enlargement. No Guarding , no rebound. No Bruits. Bowel sounds: normal  RECTAL: N/A GU: N/A  EXTREMETIES: nonedematous. Both Femoral and Pedal pulses are normal.  MUSCULOSKELETAL:  Spine: normal Joints: intact  NEUROLOGIC: oriented to time,place and person; nonfocal. Strength is normal Sensory is normal Reflexes are normal Cranial Nerves are normal.  ASSESSMENT: CAD, multiple vessel  Hyperlipidemia LDL goal < 70 - Plan: CMP14+EGFR, NMR, lipoprofile  Hypertension, benign essential, age 55-18 - Plan: CMP14+EGFR  PVD (peripheral vascular disease) with claudication  Arthritis of both knees  Hyperglycemia - Plan: POCT glycosylated hemoglobin (Hb A1C)   PLAN: Knee Arthrocentesis  Injection Procedure Note  Pre-operative Diagnosis: right  Knee osteoarthritis  Post-operative Diagnosis: same  Indications: Symptom relief from osteoarthritis  Anesthesia: ethyl chloride  Procedure Details   Verbal consent was obtained for the procedure. The joint was prepped with Betadine and ethyl chloride spray used for topical anesthetic. A 22 gauge needle was inserted into the superior aspect of the joint from a lateral  approach.  Then 1 cc of lidocaine, 0.5 cc marcaine,0.5 cc of K40 injected into the joint. The needle was removed and the area cleansed and dressed.  Complications:  none  Orders Placed This Encounter  Procedures  . CMP14+EGFR  . NMR, lipoprofile  . POCT glycosylated hemoglobin (Hb A1C)    Diet and exercise.       Dr Woodroe Mode Recommendations  Diet and Exercise discussed with patient.  For nutrition information, I recommend books:  1).Eat to Live by Dr Monico Hoar. 2).Prevent and Reverse Heart Disease by Dr Suzzette Righter. 3) Dr Katherina Right Book:  Program to Reverse Diabetes  Exercise recommendations are:  If unable to walk, then the patient can exercise in a chair 3 times a day. By  flapping arms like a bird gently and raising legs outwards to the front.  If ambulatory, the patient can go for walks for 30 minutes 3 times a week. Then increase the intensity and duration as tolerated.  Goal is to try to attain exercise frequency to 5 times a week.  If applicable: Best to perform resistance exercises (machines or weights) 2 days a week and cardio type exercises 3 days per week.  Orders Placed This Encounter  Procedures  . CMP14+EGFR  . NMR, lipoprofile  . POCT glycosylated hemoglobin (Hb A1C)    Return in about 4 months (around 05/07/2013) for Recheck medical problems.  Jahniah Pallas P. [place] Charon, M.D.

## 2013-01-05 NOTE — Patient Instructions (Addendum)
      Dr Greenlee Ancheta's Recommendations  Diet and Exercise discussed with patient.  For nutrition information, I recommend books:  1).Eat to Live by Dr Joel Fuhrman. 2).Prevent and Reverse Heart Disease by Dr Caldwell Esselstyn. 3) Dr Neal Barnard's Book:  Program to Reverse Diabetes  Exercise recommendations are:  If unable to walk, then the patient can exercise in a chair 3 times a day. By flapping arms like a bird gently and raising legs outwards to the front.  If ambulatory, the patient can go for walks for 30 minutes 3 times a week. Then increase the intensity and duration as tolerated.  Goal is to try to attain exercise frequency to 5 times a week.  If applicable: Best to perform resistance exercises (machines or weights) 2 days a week and cardio type exercises 3 days per week.  

## 2013-01-06 LAB — CMP14+EGFR
ALT: 12 IU/L (ref 0–44)
AST: 20 IU/L (ref 0–40)
Albumin/Globulin Ratio: 1.5 (ref 1.1–2.5)
Albumin: 4.3 g/dL (ref 3.5–4.8)
Alkaline Phosphatase: 72 IU/L (ref 39–117)
BUN/Creatinine Ratio: 15 (ref 10–22)
BUN: 16 mg/dL (ref 8–27)
CO2: 26 mmol/L (ref 18–29)
Calcium: 9.7 mg/dL (ref 8.6–10.2)
Chloride: 102 mmol/L (ref 97–108)
Creatinine, Ser: 1.08 mg/dL (ref 0.76–1.27)
GFR calc Af Amer: 77 mL/min/{1.73_m2} (ref >59)
GFR calc non Af Amer: 66 mL/min/{1.73_m2} (ref >59)
Globulin, Total: 2.8 g/dL (ref 1.5–4.5)
Glucose: 100 mg/dL — ABNORMAL HIGH (ref 65–99)
Potassium: 4.7 mmol/L (ref 3.5–5.2)
Sodium: 140 mmol/L (ref 134–144)
Total Bilirubin: 0.8 mg/dL (ref 0.0–1.2)
Total Protein: 7.1 g/dL (ref 6.0–8.5)

## 2013-01-06 LAB — NMR, LIPOPROFILE
Cholesterol: 134 mg/dL (ref <200)
HDL Cholesterol by NMR: 55 mg/dL (ref >=40)
HDL Particle Number: 30.8 umol/L (ref >=30.5)
LDL Particle Number: 1013 nmol/L — ABNORMAL HIGH (ref <1000)
LDL Size: 20.4 nm — ABNORMAL LOW (ref >20.5)
LDLC SERPL CALC-MCNC: 66 mg/dL (ref <100)
LP-IR Score: <25 (ref <=45)
Small LDL Particle Number: 496 nmol/L (ref <=527)
Triglycerides by NMR: 66 mg/dL (ref <150)

## 2013-01-07 NOTE — Progress Notes (Signed)
Quick Note:  Labs abnormal. Patient is Diabetic. Needs to see the pharmacist for DM education and initiation of therapy.  ______

## 2013-01-20 ENCOUNTER — Ambulatory Visit (INDEPENDENT_AMBULATORY_CARE_PROVIDER_SITE_OTHER): Payer: Medicare Other | Admitting: Pharmacist

## 2013-01-20 VITALS — BP 118/70 | HR 67 | Ht 69.0 in | Wt 195.0 lb

## 2013-01-20 DIAGNOSIS — E1165 Type 2 diabetes mellitus with hyperglycemia: Secondary | ICD-10-CM | POA: Insufficient documentation

## 2013-01-20 DIAGNOSIS — IMO0001 Reserved for inherently not codable concepts without codable children: Secondary | ICD-10-CM

## 2013-01-20 MED ORDER — ONETOUCH DELICA LANCETS FINE MISC: 1.0000 | Freq: Every day | Status: DC

## 2013-01-20 MED ORDER — GLUCOSE BLOOD VI STRP: ORAL_STRIP | Status: DC

## 2013-01-20 MED ORDER — METFORMIN HCL ER 500 MG PO TB24: 500.0000 mg | ORAL_TABLET | Freq: Every day | ORAL | Status: DC

## 2013-01-20 NOTE — Progress Notes (Signed)
Patient ID: Joseph Byrd, male   DOB: [birth year], 79 y.o.   MRN: [id number] Diabetes Flow Sheet:  Visit 1  Chief Complaint:   Chief Complaint  Patient presents with  . Diabetes    newly diagnosed    Filed Vitals:   01/20/13 1258  BP: 118/70  Pulse: 67   Filed Weights   01/20/13 1258  Weight: 195 lb (88.451 kg)     Exam Edema:  negative  Polyuria:  negatvie  Polydipsia:  negatvie Polyphagia:  negative  BMI:  Body mass index is 28.78 kg/(m^2).   Weight changes:  stable General Appearance:  alert, oriented, no acute distress and well nourished Mood/Affect:  normal  Low fat/carbohydrate diet?  Yes - wife is diabetic so he is use to limiting CHO's and sweets. Whole grain bread, occasional biscuit, no sodas - only water,  Corn or potatoes only once a week. No fried foods He does have rice and dried beans and peas frequently Nicotine Abuse?  No Medication Compliance?  Yes Exercise?  No Alcohol Abuse?  No  Does not check his BG but his checks his wife's BG for her  Lab Results  Component Value Date   HGBA1C 7.8 01/05/2013    Lab Results  Component Value Date   CHOL 134 01/05/2013   LDLCALC 71 09/03/2012   TRIG 66 09/03/2012     Medication Checklist: ACE Inhibitor/ARB?  Yes Lipid Lowering Agent?  Yes Aspirin?  Yes Oral Hypoglycemic Agent(s)?  No  Assessment: 1.  type 2 Diabetes.  Newly diagnosed 2.  Blood Pressure Control.  Well controlled 3.  Lipid Control.  At all lipid goals  Recommendations: 1.  1600 calorie, carbohydrate counting diet.  Patient is counseled extensively on carbohydrate counting, serving sizes, saturated fat intake and meal planning.  Patient is instructed to eat 3 meals a day and 3 small snacks.  Patient will supplement snacks based on physical activity. 2.  30 minutes of physical activity.  Patient is counseled to always carry glucose tablets, lifesavers, hard candies, etc., while exercising in case of hypoglycemic event. 3.  Patient is  counseled on pathophysiology of diabetes and the risk of long-term complications.  Fasting blood glucose goals are 80-130mg /dL.  Post-prandial goals are < 180.  A1C goals < 7.0%. 4.  LDL goal of < 70, HDL > 45 and TG < 150; BP goal < 140/85 5.  Patient is counseled on proper use of glucometer and lancing device.  Patient is informed how often to test and how to respond to unsuitable results. Given One Touch Verio glucometer since this is meter his wife has.  Rx for testing supplies also given today.  Instructed to check BG daily. 6.  Medication recommendations at this time are as follows:      Start metformin XR 500mg  daily with breakfast  Discontinue Niaspan 500mg  7.  RTC in 6 weeks - recheck BMET and review BG readings from home.  Time spent counseling patient:  60 minutes    Henrene Pastor, PharmD, CPP

## 2013-01-20 NOTE — Patient Instructions (Signed)
Blood glucose / sugar goals  Fasting (nothing to eat for last 4 hours) 80 - 130  Post prandial (within 2 hours of start of a meal)  Less than 180.

## 2013-02-02 ENCOUNTER — Other Ambulatory Visit: Payer: Self-pay

## 2013-02-02 MED ORDER — VALSARTAN-HYDROCHLOROTHIAZIDE 320-25 MG PO TABS: 1.0000 | ORAL_TABLET | Freq: Every day | ORAL | Status: DC

## 2013-02-05 ENCOUNTER — Other Ambulatory Visit: Payer: Self-pay | Admitting: *Deleted

## 2013-02-05 MED ORDER — ATORVASTATIN CALCIUM 20 MG PO TABS: 20.0000 mg | ORAL_TABLET | Freq: Every day | ORAL | Status: DC

## 2013-03-04 ENCOUNTER — Ambulatory Visit (INDEPENDENT_AMBULATORY_CARE_PROVIDER_SITE_OTHER): Payer: Medicare Other | Admitting: Pharmacist

## 2013-03-04 VITALS — BP 150/88 | HR 64 | Ht 69.0 in | Wt 189.0 lb

## 2013-03-04 DIAGNOSIS — E1165 Type 2 diabetes mellitus with hyperglycemia: Secondary | ICD-10-CM

## 2013-03-04 DIAGNOSIS — Z23 Encounter for immunization: Secondary | ICD-10-CM

## 2013-03-04 DIAGNOSIS — IMO0001 Reserved for inherently not codable concepts without codable children: Secondary | ICD-10-CM

## 2013-03-04 DIAGNOSIS — E785 Hyperlipidemia, unspecified: Secondary | ICD-10-CM

## 2013-03-04 NOTE — Addendum Note (Signed)
Addended by: Ardine Eng A on: 03/04/2013 08:40 AM   Modules accepted: Orders

## 2013-03-04 NOTE — Progress Notes (Signed)
Diabetes Follow-Up Visit Chief Complaint:   Chief Complaint  Patient presents with  . Diabetes     Filed Vitals:   03/04/13 0815  BP: 150/88  Pulse: 64      HPI:  Joseph Byrd is here for a recheck of type 2 DM.  He was diagnosed about 2 months ago and started on metformin XR 500mg  1 daily.  He has tolerated metformin well.  He was also instructed on a ADA carb counting diet and on how to use a glucometer.  See below for recent HBG readings.   In reviewing the patient's medication list he stated that his cardiologist Dr Yates Decamp discontinued one of his medicaiton which I felt was probably clopidogrel since his stent was placed over a year ago.  However, when we got out his medications the clopidogrel was in his bag and the pletal/cilostazol was missing.  I contacted Dr. Verl Dicker office an verified that he was suppose to discontinue clopidogrel and continue cilostazol.   Filed Vitals:   03/04/13 0815  BP: 150/88  Pulse: 64   Filed Weights   03/04/13 0815  Weight: 189 lb (85.73 kg)     Exam Edema:  negative  Polyuria:  negative  Polydipsia:  negative Polyphagia:  negative  BMI:  Body mass index is 27.9 kg/(m^2).   Weight changes:  Decrease 6 lbs since last visit - about 6 weeks ago General Appearance:  alert, oriented, no acute distress and well nourished Mood/Affect:  normal   Low fat/carbohydrate diet?  Yes Nicotine Abuse?  No Medication Compliance?  No Exercise?  Yes Alcohol Abuse?  No  Home BG Monitoring:  Checking 1 times a day. Average:  100   High: 121  Low:  89    Lab Results  Component Value Date   HGBA1C 7.8 01/05/2013    No results found for this basenameConcepcion Byrd    Lab Results  Component Value Date   CHOL 134 01/05/2013   LDLCALC 71 09/03/2012   TRIG 66 09/03/2012      Assessment: 1.  Diabetes.  Well controlled per HBG readings 2.  Blood Pressure.  SBP elevated today 3.  Lipids.  Last panel showed at goal  Recommendations: 1.   Medication recommendations at this time are as follows:    Discussed indications of each medication  Continue metformin XR 500mg  daily  Discontinue clopidogrel and restart cilostazol  2.  Reviewed HBG goals:  Fasting 80-130 and 1-2 hour post prandial <180.  Patient is instructed to check BG 1 times per day.    3.  BP goal < 140/80. 4.  LDL goal of < 100, HDL > 40 and TG < 150. 5.  Eye Exam yearly and Dental Exam every 6 months. 6.  Dietary recommendations:  Continue current diet changes 7.  Physical Activity recommendations:  Continue daily exercise 8.  Patient education on appropriate foot care.   9.  Return to clinic in 3-4 months 10.  BMET checked today - pending 11.  Influenza vaccine today   Time spent counseling patient:  30 minutes    Referring provider:  Pam Drown, PharmD, CPP

## 2013-03-05 LAB — BMP8+EGFR
BUN/Creatinine Ratio: 14 (ref 10–22)
BUN: 14 mg/dL (ref 8–27)
CO2: 26 mmol/L (ref 18–29)
Chloride: 102 mmol/L (ref 97–108)
Glucose: 97 mg/dL (ref 65–99)

## 2013-03-11 ENCOUNTER — Telehealth: Payer: Self-pay | Admitting: Pharmacist

## 2013-03-11 NOTE — Telephone Encounter (Signed)
BMET - WNL.  Patient's wife notified

## 2013-05-11 ENCOUNTER — Encounter: Payer: Self-pay | Admitting: Family Medicine

## 2013-05-11 ENCOUNTER — Ambulatory Visit (INDEPENDENT_AMBULATORY_CARE_PROVIDER_SITE_OTHER): Payer: Medicare Other | Admitting: Family Medicine

## 2013-05-11 VITALS — BP 143/78 | HR 62 | Temp 97.7°F | Ht 69.0 in | Wt 188.2 lb

## 2013-05-11 DIAGNOSIS — E1165 Type 2 diabetes mellitus with hyperglycemia: Secondary | ICD-10-CM

## 2013-05-11 DIAGNOSIS — E785 Hyperlipidemia, unspecified: Secondary | ICD-10-CM

## 2013-05-11 DIAGNOSIS — H269 Unspecified cataract: Secondary | ICD-10-CM

## 2013-05-11 DIAGNOSIS — I739 Peripheral vascular disease, unspecified: Secondary | ICD-10-CM

## 2013-05-11 DIAGNOSIS — IMO0002 Reserved for concepts with insufficient information to code with codable children: Secondary | ICD-10-CM

## 2013-05-11 DIAGNOSIS — I10 Essential (primary) hypertension: Secondary | ICD-10-CM

## 2013-05-11 DIAGNOSIS — I2510 Atherosclerotic heart disease of native coronary artery without angina pectoris: Secondary | ICD-10-CM

## 2013-05-11 DIAGNOSIS — M1710 Unilateral primary osteoarthritis, unspecified knee: Secondary | ICD-10-CM

## 2013-05-11 DIAGNOSIS — M170 Bilateral primary osteoarthritis of knee: Secondary | ICD-10-CM

## 2013-05-11 DIAGNOSIS — IMO0001 Reserved for inherently not codable concepts without codable children: Secondary | ICD-10-CM

## 2013-05-11 LAB — POCT GLYCOSYLATED HEMOGLOBIN (HGB A1C): Hemoglobin A1C: 4.6

## 2013-05-11 LAB — POCT UA - MICROALBUMIN: Microalbumin Ur, POC: 50 mg/L

## 2013-05-11 NOTE — Progress Notes (Signed)
Patient ID: Joseph Byrd, male   DOB: 27-Jan-1934, 79 y.o.   MRN: [id number] SUBJECTIVE: CC: Chief Complaint  Patient presents with  . Follow-up    4 month follow up no complaints    HPI:  Patient is here for follow up of Diabetes Mellitus/HTN/HLD: Symptoms evaluated: Denies Nocturia ,Denies Urinary Frequency , denies Blurred vision ,deniesDizziness,denies.Dysuria,denies paresthesias, denies extremity pain or ulcers.Marland Kitchendenies chest pain. has had an annual eye exam. do check the feet. Does check CBGs. Average CBG:98 Denies episodes of hypoglycemia. Does have an emergency hypoglycemic plan. admits toCompliance with medications. Denies Problems with medications.  Has not taken medications as yet.   Past Medical History  Diagnosis Date  . Hypertension   . PAD (peripheral artery disease)   . Hyperlipidemia   . CAD (coronary artery disease)   . Cataract    Past Surgical History  Procedure Laterality Date  . Angioplasty lad  03 20 2012  . Back surgery     History   Social History  . Marital Status: Married    Spouse Name: N/A    Number of Children: N/A  . Years of Education: N/A   Occupational History  . Not on file.   Social History Main Topics  . Smoking status: Former Smoker    Types: Cigarettes    Quit date: 09/04/1982  . Smokeless tobacco: Not on file  . Alcohol Use: No  . Drug Use: No  . Sexual Activity: Not on file   Other Topics Concern  . Not on file   Social History Narrative  . No narrative on file   No family history on file. Current Outpatient Prescriptions on File Prior to Visit  Medication Sig Dispense Refill  . aspirin 81 MG tablet Take 81 mg by mouth daily.        Marland Kitchen atorvastatin (LIPITOR) 20 MG tablet Take 1 tablet (20 mg total) by mouth daily.  30 tablet  5  . carvedilol (COREG) 3.125 MG tablet Take 3.125 mg by mouth 2 (two) times daily with a meal.       . cilostazol (PLETAL) 100 MG tablet Take 100 mg by mouth 2 (two) times daily.        Marland Kitchen doxazosin (CARDURA) 8 MG tablet Take 8 mg by mouth at bedtime.        Marland Kitchen glucose blood test strip One Touch Verio Test strips Use to check BG daily Dx: 250.02  50 each  12  . isosorbide mononitrate (IMDUR) 60 MG 24 hr tablet Take 60 mg by mouth daily.       . metFORMIN (GLUCOPHAGE-XR) 500 MG 24 hr tablet Take 1 tablet (500 mg total) by mouth daily with breakfast.  30 tablet  3  . ONETOUCH DELICA LANCETS FINE MISC 1 each by Does not apply route daily. Dx: 250.02  100 each  3  . TRAVATAN Z 0.004 % SOLN ophthalmic solution       . valsartan-hydrochlorothiazide (DIOVAN-HCT) 320-25 MG per tablet Take 1 tablet by mouth daily.  30 tablet  4   Current Facility-Administered Medications on File Prior to Visit  Medication Dose Route Frequency Provider Last Rate Last Dose  . bupivacaine (MARCAINE) 0.25 % (with pres) injection 0.5 mL  0.5 mL Intra-articular Once Ileana Ladd, MD      . lidocaine (XYLOCAINE) 2 % (with pres) injection 20 mg  1 mL Other Once Ileana Ladd, MD      . triamcinolone acetonide [facility]) injection 40 mg  40 mg Intramuscular Once Ileana Ladd, MD       No Known Allergies Immunization History  Administered Date(s) Administered  . Influenza,inj,Quad PF,36+ Mos 03/04/2013  . Pneumococcal-Unspecified 11/01/2008   Prior to Admission medications   Medication Sig Start Date End Date Taking? Authorizing Provider  aspirin 81 MG tablet Take 81 mg by mouth daily.     Yes Historical Provider, MD  atorvastatin (LIPITOR) 20 MG tablet Take 1 tablet (20 mg total) by mouth daily. 02/05/13  Yes Ileana Ladd, MD  BESIVANCE 0.6 % SUSP  05/10/13  Yes Historical Provider, MD  carvedilol (COREG) 3.125 MG tablet Take 3.125 mg by mouth 2 (two) times daily with a meal.  09/02/12  Yes Historical Provider, MD  cilostazol (PLETAL) 100 MG tablet Take 100 mg by mouth 2 (two) times daily.  08/02/12  Yes Historical Provider, MD  doxazosin (CARDURA) 8 MG tablet Take 8 mg by mouth at bedtime.     Yes  Historical Provider, MD  DUREZOL 0.05 % EMUL  05/10/13  Yes Historical Provider, MD  glucose blood test strip One Touch Verio Test strips Use to check BG daily Dx: 250.02 01/20/13  Yes Tammy Eckard, PHARMD  ILEVRO 0.3 % SUSP  05/10/13  Yes Historical Provider, MD  isosorbide mononitrate (IMDUR) 60 MG 24 hr tablet Take 60 mg by mouth daily.  08/30/12  Yes Historical Provider, MD  metFORMIN (GLUCOPHAGE-XR) 500 MG 24 hr tablet Take 1 tablet (500 mg total) by mouth daily with breakfast. 01/20/13  Yes Tammy Eckard, PHARMD  ONETOUCH DELICA LANCETS FINE MISC 1 each by Does not apply route daily. Dx: 250.02 01/20/13  Yes Tammy Eckard, PHARMD  TRAVATAN Z 0.004 % SOLN ophthalmic solution  07/06/12  Yes Historical Provider, MD  valsartan-hydrochlorothiazide (DIOVAN-HCT) 320-25 MG per tablet Take 1 tablet by mouth daily. 02/02/13  Yes Ileana Ladd, MD     ROS: As above in the HPI. All other systems are stable or negative.  OBJECTIVE: APPEARANCE:  Patient in no acute distress.The patient appeared well nourished and normally developed. Acyanotic. Waist: VITAL SIGNS:BP 143/78  Pulse 62  Temp(Src) 97.7 F (36.5 C) (Oral)  Ht 5\' 9"  (1.753 m)  Wt 188 lb 3.2 oz (85.367 kg)  BMI 27.78 kg/m2  AAM  SKIN: warm and  Dry without overt rashes, tattoos and scars  HEAD and Neck: without JVD, Head and scalp: normal Eyes:No scleral icterus. Fundi normal, eye movements normal. Ears: Auricle normal, canal normal, Tympanic membranes normal, insufflation normal. Nose: normal Throat: normal Neck & thyroid: normal  CHEST & LUNGS: Chest wall: normal Lungs: Clear  CVS: Reveals the PMI to be normally located. Regular rhythm, First and Second Heart sounds are normal,  absence of murmurs, rubs or gallops. Peripheral vasculature: Radial pulses: normal Dorsal pedis pulses: normal Posterior pulses: normal  ABDOMEN:  Appearance: normal Benign, no organomegaly, no masses, no Abdominal Aortic enlargement. No  Guarding , no rebound. No Bruits. Bowel sounds: normal  RECTAL: N/A GU: N/A  EXTREMETIES: nonedematous.  MUSCULOSKELETAL:  Spine: normal Joints: intact  NEUROLOGIC: oriented to time,place and person; nonfocal. Strength is normal Sensory is normal Reflexes are normal Cranial Nerves are normal. See DM foot exam module  Results for orders placed in visit on 05/11/13  POCT GLYCOSYLATED HEMOGLOBIN (HGB A1C)      Result Value Range   Hemoglobin A1C 4.6    POCT UA - MICROALBUMIN      Result Value Range   Microalbumin Ur, POC 50  ASSESSMENT: Hyperlipidemia LDL goal < 70 - Plan: CMP14+EGFR, Lipid panel  Hypertension, benign essential, age 6-18 - Plan: CMP14+EGFR  Diabetes type 2, uncontrolled - Plan: POCT glycosylated hemoglobin (Hb A1C), POCT UA - Microalbumin, Microalbumin, urine  CAD, multiple vessel  Arthritis of both knees  PVD (peripheral vascular disease) with claudication  Cataract  PLAN:      Dr Woodroe Mode Recommendations  For nutrition information, I recommend books:  1).Eat to Live by Dr Monico Hoar. 2).Prevent and Reverse Heart Disease by Dr Suzzette Righter. 3) Dr Katherina Right Book:  Program to Reverse Diabetes  Exercise recommendations are:  If unable to walk, then the patient can exercise in a chair 3 times a day. By flapping arms like a bird gently and raising legs outwards to the front.  If ambulatory, the patient can go for walks for 30 minutes 3 times a week. Then increase the intensity and duration as tolerated.  Goal is to try to attain exercise frequency to 5 times a week.  If applicable: Best to perform resistance exercises (machines or weights) 2 days a week and cardio type exercises 3 days per week.   Measure BPs daily and drop off in 2 weeks.   Orders Placed This Encounter  Procedures  . CMP14+EGFR  . Lipid panel  . Microalbumin, urine  . POCT glycosylated hemoglobin (Hb A1C)  . POCT UA - Microalbumin   Meds  ordered this encounter  Medications  . BESIVANCE 0.6 % SUSP    Sig:   . DUREZOL 0.05 % EMUL    Sig:   . ILEVRO 0.3 % SUSP    Sig:    May need to reduce DM medications. Await the rest of medications  There are no discontinued medications. Return in about 3 months (around 08/09/2013) for Recheck medical problems.  Ladasha Schnackenberg P. Modesto Charon, M.D.

## 2013-05-11 NOTE — Patient Instructions (Signed)
Diabetes and Foot Care Diabetes may cause you to have problems because of poor blood supply (circulation) to your feet and legs. This may cause the skin on your feet to become thinner, break easier, and heal more slowly. Your skin may become dry, and the skin may peel and crack. You may also have nerve damage in your legs and feet causing decreased feeling in them. You may not notice minor injuries to your feet that could lead to infections or more serious problems. Taking care of your feet is one of the most important things you can do for yourself.  HOME CARE INSTRUCTIONS  Wear shoes at all times, even in the house. Do not go barefoot. Bare feet are easily injured.  Check your feet daily for blisters, cuts, and redness. If you cannot see the bottom of your feet, use a mirror or ask someone for help.  Wash your feet with warm water (do not use hot water) and mild soap. Then pat your feet and the areas between your toes until they are completely dry. Do not soak your feet as this can dry your skin.  Apply a moisturizing lotion or petroleum jelly (that does not contain alcohol and is unscented) to the skin on your feet and to dry, brittle toenails. Do not apply lotion between your toes.  Trim your toenails straight across. Do not dig under them or around the cuticle. File the edges of your nails with an emery board or nail file.  Do not cut corns or calluses or try to remove them with medicine.  Wear clean socks or stockings every day. Make sure they are not too tight. Do not wear knee-high stockings since they may decrease blood flow to your legs.  Wear shoes that fit properly and have enough cushioning. To break in new shoes, wear them for just a few hours a day. This prevents you from injuring your feet. Always look in your shoes before you put them on to be sure there are no objects inside.  Do not cross your legs. This may decrease the blood flow to your feet.  If you find a minor scrape,  cut, or break in the skin on your feet, keep it and the skin around it clean and dry. These areas may be cleansed with mild soap and water. Do not cleanse the area with peroxide, alcohol, or iodine.  When you remove an adhesive bandage, be sure not to damage the skin around it.  If you have a wound, look at it several times a day to make sure it is healing.  Do not use heating pads or hot water bottles. They may burn your skin. If you have lost feeling in your feet or legs, you may not know it is happening until it is too late.  Make sure your health care provider performs a complete foot exam at least annually or more often if you have foot problems. Report any cuts, sores, or bruises to your health care provider immediately. SEEK MEDICAL CARE IF:   You have an injury that is not healing.  You have cuts or breaks in the skin.  You have an ingrown nail.  You notice redness on your legs or feet.  You feel burning or tingling in your legs or feet.  You have pain or cramps in your legs and feet.  Your legs or feet are numb.  Your feet always feel cold. SEEK IMMEDIATE MEDICAL CARE IF:   There is increasing redness,   swelling, or pain in or around a wound.  There is a red line that goes up your leg.  Pus is coming from a wound.  You develop a fever or as directed by your health care provider.  You notice a bad smell coming from an ulcer or wound. Document Released: 05/17/2000 Document Revised: 01/20/2013 Document Reviewed: 10/27/2012 [employer] Patient Information 2014 [employer].        Dr Natia Fahmy's Recommendations  For nutrition information, I recommend books:  1).Eat to Live by Dr Joel Fuhrman. 2).Prevent and Reverse Heart Disease by Dr Caldwell Esselstyn. 3) Dr Neal Barnard's Book:  Program to Reverse Diabetes  Exercise recommendations are:  If unable to walk, then the patient can exercise in a chair 3 times a day. By flapping arms like a bird gently and  raising legs outwards to the front.  If ambulatory, the patient can go for walks for 30 minutes 3 times a week. Then increase the intensity and duration as tolerated.  Goal is to try to attain exercise frequency to 5 times a week.  If applicable: Best to perform resistance exercises (machines or weights) 2 days a week and cardio type exercises 3 days per week.  

## 2013-05-12 LAB — LIPID PANEL
Chol/HDL Ratio: 2.4 ratio units (ref 0.0–5.0)
Cholesterol, Total: 148 mg/dL (ref 100–199)
HDL: 62 mg/dL (ref >39)
LDL Calculated: 78 mg/dL (ref 0–99)
Triglycerides: 42 mg/dL (ref 0–149)
VLDL Cholesterol Cal: 8 mg/dL (ref 5–40)

## 2013-05-12 LAB — CMP14+EGFR
ALT: 13 IU/L (ref 0–44)
AST: 18 IU/L (ref 0–40)
Albumin/Globulin Ratio: 1.8 (ref 1.1–2.5)
Albumin: 4.6 g/dL (ref 3.5–4.8)
Alkaline Phosphatase: 60 IU/L (ref 39–117)
BUN/Creatinine Ratio: 19 (ref 10–22)
BUN: 19 mg/dL (ref 8–27)
CO2: 27 mmol/L (ref 18–29)
Calcium: 9.8 mg/dL (ref 8.6–10.2)
Chloride: 102 mmol/L (ref 97–108)
Creatinine, Ser: 0.98 mg/dL (ref 0.76–1.27)
GFR calc Af Amer: 86 mL/min/1.73
GFR calc non Af Amer: 75 mL/min/1.73
Globulin, Total: 2.5 g/dL (ref 1.5–4.5)
Glucose: 102 mg/dL — ABNORMAL HIGH (ref 65–99)
Potassium: 4.7 mmol/L (ref 3.5–5.2)
Sodium: 143 mmol/L (ref 134–144)
Total Bilirubin: 0.6 mg/dL (ref 0.0–1.2)
Total Protein: 7.1 g/dL (ref 6.0–8.5)

## 2013-05-12 LAB — MICROALBUMIN, URINE: Microalbumin, Urine: 44.3 ug/mL — ABNORMAL HIGH (ref 0.0–17.0)

## 2013-05-31 ENCOUNTER — Other Ambulatory Visit: Payer: Self-pay | Admitting: Family Medicine

## 2013-06-08 ENCOUNTER — Telehealth: Payer: Self-pay | Admitting: Family Medicine

## 2013-06-26 ENCOUNTER — Other Ambulatory Visit: Payer: Self-pay | Admitting: Family Medicine

## 2013-08-27 ENCOUNTER — Ambulatory Visit (INDEPENDENT_AMBULATORY_CARE_PROVIDER_SITE_OTHER): Payer: Medicare HMO | Admitting: Family Medicine

## 2013-08-27 ENCOUNTER — Encounter: Payer: Self-pay | Admitting: Family Medicine

## 2013-08-27 VITALS — BP 134/71 | HR 70 | Temp 97.7°F | Ht 69.0 in | Wt 192.4 lb

## 2013-08-27 DIAGNOSIS — IMO0001 Reserved for inherently not codable concepts without codable children: Secondary | ICD-10-CM

## 2013-08-27 DIAGNOSIS — I10 Essential (primary) hypertension: Secondary | ICD-10-CM

## 2013-08-27 DIAGNOSIS — E1165 Type 2 diabetes mellitus with hyperglycemia: Secondary | ICD-10-CM

## 2013-08-27 DIAGNOSIS — M170 Bilateral primary osteoarthritis of knee: Secondary | ICD-10-CM

## 2013-08-27 DIAGNOSIS — I2510 Atherosclerotic heart disease of native coronary artery without angina pectoris: Secondary | ICD-10-CM

## 2013-08-27 DIAGNOSIS — IMO0002 Reserved for concepts with insufficient information to code with codable children: Secondary | ICD-10-CM

## 2013-08-27 DIAGNOSIS — M1710 Unilateral primary osteoarthritis, unspecified knee: Secondary | ICD-10-CM

## 2013-08-27 DIAGNOSIS — H269 Unspecified cataract: Secondary | ICD-10-CM

## 2013-08-27 DIAGNOSIS — E785 Hyperlipidemia, unspecified: Secondary | ICD-10-CM

## 2013-08-27 DIAGNOSIS — I739 Peripheral vascular disease, unspecified: Secondary | ICD-10-CM

## 2013-08-27 LAB — POCT GLYCOSYLATED HEMOGLOBIN (HGB A1C): Hemoglobin A1C: 4.6

## 2013-08-27 NOTE — Progress Notes (Signed)
Patient ID: Arch Methot, male   DOB: 06/06/1933, 80 y.o.   MRN: [id number] SUBJECTIVE: CC: Chief Complaint  Patient presents with  . Follow-up    3 month follow up chronic problems     HPI: Patient is here for follow up of Diabetes Mellitus/HLD/CAD/PVD: Symptoms evaluated: Denies Nocturia ,Denies Urinary Frequency , denies Blurred vision ,deniesDizziness,denies.Dysuria,denies paresthesias, denies extremity pain or ulcers.Marland Kitchendenies chest pain. has had an annual eye exam. do check the feet. Does check CBGs. Average CBG:90s Denies episodes of hypoglycemia. Does have an emergency hypoglycemic plan. admits toCompliance with medications.Came off of metformin as instructed and sugars still good. Denies Problems with medications. Diet: not fully vegan as yet.But eating healthier.   Right knee arthritis minimal. Better.   Past Medical History  Diagnosis Date  . Hypertension   . PAD (peripheral artery disease)   . Hyperlipidemia   . CAD (coronary artery disease)   . Cataract    Past Surgical History  Procedure Laterality Date  . Angioplasty lad  03 20 2012  . Back surgery     History   Social History  . Marital Status: Married    Spouse Name: N/A    Number of Children: N/A  . Years of Education: N/A   Occupational History  . Not on file.   Social History Main Topics  . Smoking status: Former Smoker    Types: Cigarettes    Quit date: 09/04/1982  . Smokeless tobacco: Not on file  . Alcohol Use: No  . Drug Use: No  . Sexual Activity: Not on file   Other Topics Concern  . Not on file   Social History Narrative  . No narrative on file   No family history on file. Current Outpatient Prescriptions on File Prior to Visit  Medication Sig Dispense Refill  . aspirin 81 MG tablet Take 81 mg by mouth daily.        Marland Kitchen atorvastatin (LIPITOR) 20 MG tablet Take 1 tablet (20 mg total) by mouth daily.  30 tablet  5  . BESIVANCE 0.6 % SUSP       . carvedilol (COREG) 3.125 MG  tablet Take 3.125 mg by mouth 2 (two) times daily with a meal.       . cilostazol (PLETAL) 100 MG tablet Take 100 mg by mouth 2 (two) times daily.       . DUREZOL 0.05 % EMUL       . glucose blood test strip One Touch Verio Test strips Use to check BG daily Dx: 250.02  50 each  12  . ILEVRO 0.3 % SUSP       . isosorbide mononitrate (IMDUR) 60 MG 24 hr tablet Take 60 mg by mouth daily.       Glory Rosebush DELICA LANCETS FINE MISC 1 each by Does not apply route daily. Dx: 250.02  100 each  3  . TRAVATAN Z 0.004 % SOLN ophthalmic solution       . valsartan-hydrochlorothiazide (DIOVAN-HCT) 320-25 MG per tablet TAKE ONE TABLET BY MOUTH ONE TIME DAILY  30 tablet  4  . doxazosin (CARDURA) 8 MG tablet Take 8 mg by mouth at bedtime.         Current Facility-Administered Medications on File Prior to Visit  Medication Dose Route Frequency Provider Last Rate Last Dose  . bupivacaine (MARCAINE) 0.25 % (with pres) injection 0.5 mL  0.5 mL Intra-articular Once Vernie Shanks, MD      . lidocaine (XYLOCAINE) 2 % (  with pres) injection 20 mg  1 mL Other Once Vernie Shanks, MD      . triamcinolone acetonide [facility]) injection 40 mg  40 mg Intramuscular Once Vernie Shanks, MD       No Known Allergies Immunization History  Administered Date(s) Administered  . Influenza,inj,Quad PF,36+ Mos 03/04/2013  . Pneumococcal-Unspecified 11/01/2008   Prior to Admission medications   Medication Sig Start Date End Date Taking? Authorizing Provider  aspirin 81 MG tablet Take 81 mg by mouth daily.      Historical Provider, MD  atorvastatin (LIPITOR) 20 MG tablet Take 1 tablet (20 mg total) by mouth daily. 02/05/13   Vernie Shanks, MD  BESIVANCE 0.6 % SUSP  05/10/13   Historical Provider, MD  carvedilol (COREG) 3.125 MG tablet Take 3.125 mg by mouth 2 (two) times daily with a meal.  09/02/12   Historical Provider, MD  cilostazol (PLETAL) 100 MG tablet Take 100 mg by mouth 2 (two) times daily.  08/02/12   Historical Provider,  MD  doxazosin (CARDURA) 8 MG tablet Take 8 mg by mouth at bedtime.      Historical Provider, MD  DUREZOL 0.05 % EMUL  05/10/13   Historical Provider, MD  glucose blood test strip One Touch Verio Test strips Use to check BG daily Dx: 250.02 01/20/13   Tammy Eckard, PHARMD  ILEVRO 0.3 % SUSP  05/10/13   Historical Provider, MD  isosorbide mononitrate (IMDUR) 60 MG 24 hr tablet Take 60 mg by mouth daily.  08/30/12   Historical Provider, MD  metFORMIN (GLUCOPHAGE-XR) 500 MG 24 hr tablet TAKE 1 TABLET BY MOUTH DAILY WITH BREAKFAST 05/31/13   Chipper Herb, MD  [facility] DELICA LANCETS FINE MISC 1 each by Does not apply route daily. Dx: 250.02 01/20/13   Tammy Eckard, PHARMD  TRAVATAN Z 0.004 % SOLN ophthalmic solution  07/06/12   Historical Provider, MD  valsartan-hydrochlorothiazide (DIOVAN-HCT) 320-25 MG per tablet TAKE ONE TABLET BY MOUTH ONE TIME DAILY 06/26/13   Vernie Shanks, MD     ROS: As above in the HPI. All other systems are stable or negative.  OBJECTIVE: APPEARANCE:  Patient in no acute distress.The patient appeared well nourished and normally developed. Acyanotic. Waist: VITAL SIGNS:BP 134/71  Pulse 70  Temp(Src) 97.7 F (36.5 C) (Oral)  Ht _0  (1.753 m)  Wt 192 lb 6.4 oz (87.272 kg)  BMI 28.40 kg/m2  AAM  SKIN: warm and  Dry without overt rashes, tattoos and scars  HEAD and Neck: without JVD, Head and scalp: normal Eyes:No scleral icterus. Fundi normal, eye movements normal. Ears: Auricle normal, canal normal, Tympanic membranes normal, insufflation normal. Nose: normal Throat: normal Neck & thyroid: normal  CHEST & LUNGS: Chest wall: normal Lungs: Clear  CVS: Reveals the PMI to be normally located. Regular rhythm, First and Second Heart sounds are normal,  absence of murmurs, rubs or gallops. Peripheral vasculature: Radial pulses: normal Dorsal pedis pulses: diminished Posterior pulses: diminished  ABDOMEN:  Appearance: normal Benign, no organomegaly,  no masses, no Abdominal Aortic enlargement. No Guarding , no rebound. No Bruits. Bowel sounds: normal  RECTAL: N/A GU: N/A  EXTREMETIES: nonedematous.  MUSCULOSKELETAL:  Spine: normal Joints: crepitus right knee.  NEUROLOGIC: oriented to time,place and person; nonfocal. Strength is normal Sensory is normal Reflexes are normal Cranial Nerves are normal.   Results for orders placed in visit on 05/11/13  CMP14+EGFR      Result Value Ref Range   Glucose 102 (*)  65 - 99 mg/dL   BUN 19  8 - 27 mg/dL   Creatinine, Ser 0.98  0.76 - 1.27 mg/dL   GFR calc non Af Amer 75  >59 mL/min/1.73   GFR calc Af Amer 86  >59 mL/min/1.73   BUN/Creatinine Ratio 19  10 - 22   Sodium 143  134 - 144 mmol/L   Potassium 4.7  3.5 - 5.2 mmol/L   Chloride 102  97 - 108 mmol/L   CO2 27  18 - 29 mmol/L   Calcium 9.8  8.6 - 10.2 mg/dL   Total Protein 7.1  6.0 - 8.5 g/dL   Albumin 4.6  3.5 - 4.8 g/dL   Globulin, Total 2.5  1.5 - 4.5 g/dL   Albumin/Globulin Ratio 1.8  1.1 - 2.5   Total Bilirubin 0.6  0.0 - 1.2 mg/dL   Alkaline Phosphatase 60  39 - 117 IU/L   AST 18  0 - 40 IU/L   ALT 13  0 - 44 IU/L  LIPID PANEL      Result Value Ref Range   Cholesterol, Total 148  100 - 199 mg/dL   Triglycerides 42  0 - 149 mg/dL   HDL 62  >39 mg/dL   VLDL Cholesterol Cal 8  5 - 40 mg/dL   LDL Calculated 78  0 - 99 mg/dL   Chol/HDL Ratio 2.4  0.0 - 5.0 ratio units  MICROALBUMIN, URINE      Result Value Ref Range   Microalbum.,U,Random 44.3 (*) 0.0 - 17.0 ug/mL  POCT GLYCOSYLATED HEMOGLOBIN (HGB A1C)      Result Value Ref Range   Hemoglobin A1C 4.6    POCT UA - MICROALBUMIN      Result Value Ref Range   Microalbumin Ur, POC 50      ASSESSMENT:  PVD (peripheral vascular disease) with claudication  Hypertension, benign essential, age 80-18 - Plan: CMP14+EGFR  Hyperlipidemia LDL goal < 70 - Plan: CMP14+EGFR, NMR, lipoprofile  Diabetes type 2, uncontrolled - Plan: POCT glycosylated hemoglobin (Hb A1C),  CMP14+EGFR  CAD, multiple vessel  Cataract  Arthritis of both knees  PLAN:      Dr Paula Libra Recommendations  For nutrition information, I recommend books:  1).Eat to Live by Dr Excell Seltzer. 2).Prevent and Reverse Heart Disease by Dr Karl Luke. 3) Dr Janene Harvey Book:  Program to Reverse Diabetes  Exercise recommendations are:  If unable to walk, then the patient can exercise in a chair 3 times a day. By flapping arms like a bird gently and raising legs outwards to the front.  If ambulatory, the patient can go for walks for 30 minutes 3 times a week. Then increase the intensity and duration as tolerated.  Goal is to try to attain exercise frequency to 5 times a week.  If applicable: Best to perform resistance exercises (machines or weights) 2 days a week and cardio type exercises 3 days per week.  Handout in the AVS.  Orders Placed This Encounter  Procedures  . CMP14+EGFR  . NMR, lipoprofile  . POCT glycosylated hemoglobin (Hb A1C)   No orders of the defined types were placed in this encounter.   Medications Discontinued During This Encounter  Medication Reason  . metFORMIN (GLUCOPHAGE-XR) 500 MG 24 hr tablet Completed Course   Return in about 3 months (around 11/27/2013) for Recheck medical problems.  Denzel Etienne P. Jacelyn Grip, M.D.

## 2013-08-27 NOTE — Patient Instructions (Signed)
Diabetes and Foot Care Diabetes may cause you to have problems because of poor blood supply (circulation) to your feet and legs. This may cause the skin on your feet to become thinner, break easier, and heal more slowly. Your skin may become dry, and the skin may peel and crack. You may also have nerve damage in your legs and feet causing decreased feeling in them. You may not notice minor injuries to your feet that could lead to infections or more serious problems. Taking care of your feet is one of the most important things you can do for yourself.  HOME CARE INSTRUCTIONS  Wear shoes at all times, even in the house. Do not go barefoot. Bare feet are easily injured.  Check your feet daily for blisters, cuts, and redness. If you cannot see the bottom of your feet, use a mirror or ask someone for help.  Wash your feet with warm water (do not use hot water) and mild soap. Then pat your feet and the areas between your toes until they are completely dry. Do not soak your feet as this can dry your skin.  Apply a moisturizing lotion or petroleum jelly (that does not contain alcohol and is unscented) to the skin on your feet and to dry, brittle toenails. Do not apply lotion between your toes.  Trim your toenails straight across. Do not dig under them or around the cuticle. File the edges of your nails with an emery board or nail file.  Do not cut corns or calluses or try to remove them with medicine.  Wear clean socks or stockings every day. Make sure they are not too tight. Do not wear knee-high stockings since they may decrease blood flow to your legs.  Wear shoes that fit properly and have enough cushioning. To break in new shoes, wear them for just a few hours a day. This prevents you from injuring your feet. Always look in your shoes before you put them on to be sure there are no objects inside.  Do not cross your legs. This may decrease the blood flow to your feet.  If you find a minor scrape,  cut, or break in the skin on your feet, keep it and the skin around it clean and dry. These areas may be cleansed with mild soap and water. Do not cleanse the area with peroxide, alcohol, or iodine.  When you remove an adhesive bandage, be sure not to damage the skin around it.  If you have a wound, look at it several times a day to make sure it is healing.  Do not use heating pads or hot water bottles. They may burn your skin. If you have lost feeling in your feet or legs, you may not know it is happening until it is too late.  Make sure your health care provider performs a complete foot exam at least annually or more often if you have foot problems. Report any cuts, sores, or bruises to your health care provider immediately. SEEK MEDICAL CARE IF:   You have an injury that is not healing.  You have cuts or breaks in the skin.  You have an ingrown nail.  You notice redness on your legs or feet.  You feel burning or tingling in your legs or feet.  You have pain or cramps in your legs and feet.  Your legs or feet are numb.  Your feet always feel cold. SEEK IMMEDIATE MEDICAL CARE IF:   There is increasing redness,   swelling, or pain in or around a wound.  There is a red line that goes up your leg.  Pus is coming from a wound.  You develop a fever or as directed by your health care provider.  You notice a bad smell coming from an ulcer or wound. Document Released: 05/17/2000 Document Revised: 01/20/2013 Document Reviewed: 10/27/2012 [facility] Patient Information 2014 [place].        Dr Paula Libra Recommendations  For nutrition information, I recommend books:  1).Eat to Live by Dr Excell Seltzer. 2).Prevent and Reverse Heart Disease by Dr Karl Luke. 3) Dr Janene Harvey Book:  Program to Reverse Diabetes  Exercise recommendations are:  If unable to walk, then the patient can exercise in a chair 3 times a day. By flapping arms like a bird gently and  raising legs outwards to the front.  If ambulatory, the patient can go for walks for 30 minutes 3 times a week. Then increase the intensity and duration as tolerated.  Goal is to try to attain exercise frequency to 5 times a week.  If applicable: Best to perform resistance exercises (machines or weights) 2 days a week and cardio type exercises 3 days per week.

## 2013-08-29 LAB — NMR, LIPOPROFILE
Cholesterol: 126 mg/dL (ref <200)
HDL Cholesterol by NMR: 47 mg/dL (ref >=40)
HDL Particle Number: 28.8 umol/L — ABNORMAL LOW (ref >=30.5)
LDL Particle Number: 680 nmol/L (ref <1000)
LDL Size: 20.4 nm — ABNORMAL LOW (ref >20.5)
LDLC SERPL CALC-MCNC: 67 mg/dL (ref <100)
LP-IR Score: 27 (ref <=45)
Small LDL Particle Number: 322 nmol/L (ref <=527)
Triglycerides by NMR: 60 mg/dL (ref <150)

## 2013-08-29 LAB — CMP14+EGFR
ALT: 14 IU/L (ref 0–44)
AST: 23 IU/L (ref 0–40)
Albumin/Globulin Ratio: 1.8 (ref 1.1–2.5)
Albumin: 4.2 g/dL (ref 3.5–4.8)
Alkaline Phosphatase: 65 IU/L (ref 39–117)
BUN/Creatinine Ratio: 16 (ref 10–22)
BUN: 15 mg/dL (ref 8–27)
CO2: 25 mmol/L (ref 18–29)
Calcium: 9 mg/dL (ref 8.6–10.2)
Chloride: 99 mmol/L (ref 97–108)
Creatinine, Ser: 0.95 mg/dL (ref 0.76–1.27)
GFR calc Af Amer: 89 mL/min/{1.73_m2} (ref >59)
GFR calc non Af Amer: 77 mL/min/{1.73_m2} (ref >59)
Globulin, Total: 2.3 g/dL (ref 1.5–4.5)
Glucose: 107 mg/dL — ABNORMAL HIGH (ref 65–99)
Potassium: 4.3 mmol/L (ref 3.5–5.2)
Sodium: 141 mmol/L (ref 134–144)
Total Bilirubin: 0.6 mg/dL (ref 0.0–1.2)
Total Protein: 6.5 g/dL (ref 6.0–8.5)

## 2013-09-24 ENCOUNTER — Other Ambulatory Visit: Payer: Self-pay | Admitting: *Deleted

## 2013-09-24 MED ORDER — VALSARTAN-HYDROCHLOROTHIAZIDE 320-25 MG PO TABS: ORAL_TABLET | ORAL | Status: DC

## 2013-09-24 MED ORDER — ATORVASTATIN CALCIUM 20 MG PO TABS: 20.0000 mg | ORAL_TABLET | Freq: Every day | ORAL | Status: DC

## 2013-10-01 ENCOUNTER — Other Ambulatory Visit: Payer: Self-pay | Admitting: *Deleted

## 2013-11-29 ENCOUNTER — Encounter: Payer: Self-pay | Admitting: Family

## 2013-11-29 ENCOUNTER — Ambulatory Visit (INDEPENDENT_AMBULATORY_CARE_PROVIDER_SITE_OTHER): Payer: Medicare HMO | Admitting: Family

## 2013-11-29 VITALS — BP 155/82 | HR 50 | Temp 96.9°F | Ht 69.0 in | Wt 190.0 lb

## 2013-11-29 DIAGNOSIS — E785 Hyperlipidemia, unspecified: Secondary | ICD-10-CM

## 2013-11-29 DIAGNOSIS — IMO0001 Reserved for inherently not codable concepts without codable children: Secondary | ICD-10-CM

## 2013-11-29 DIAGNOSIS — Z23 Encounter for immunization: Secondary | ICD-10-CM

## 2013-11-29 DIAGNOSIS — I2510 Atherosclerotic heart disease of native coronary artery without angina pectoris: Secondary | ICD-10-CM

## 2013-11-29 DIAGNOSIS — R739 Hyperglycemia, unspecified: Secondary | ICD-10-CM

## 2013-11-29 DIAGNOSIS — IMO0002 Reserved for concepts with insufficient information to code with codable children: Secondary | ICD-10-CM

## 2013-11-29 DIAGNOSIS — E1165 Type 2 diabetes mellitus with hyperglycemia: Secondary | ICD-10-CM

## 2013-11-29 DIAGNOSIS — R7309 Other abnormal glucose: Secondary | ICD-10-CM

## 2013-11-29 DIAGNOSIS — I10 Essential (primary) hypertension: Secondary | ICD-10-CM

## 2013-11-29 LAB — POCT UA - MICROALBUMIN: Microalbumin Ur, POC: NEGATIVE mg/L

## 2013-11-29 LAB — POCT GLYCOSYLATED HEMOGLOBIN (HGB A1C): Hemoglobin A1C: 4.8

## 2013-11-29 MED ORDER — ISOSORBIDE MONONITRATE ER 60 MG PO TB24: 60.0000 mg | ORAL_TABLET | Freq: Every day | ORAL | Status: DC

## 2013-11-29 MED ORDER — CARVEDILOL 6.25 MG PO TABS: 6.2500 mg | ORAL_TABLET | Freq: Two times a day (BID) | ORAL | Status: DC

## 2013-11-29 NOTE — Addendum Note (Signed)
Addended by: Jamelle Haring on: 11/29/2013 09:15 AM   Modules accepted: Orders

## 2013-11-29 NOTE — Progress Notes (Signed)
Subjective:    Patient ID: Joseph Byrd, male    DOB: Oct 04, 1933, 80 y.o.   MRN: [id number]  Hyperlipidemia This is a chronic problem. The current episode started more than 1 year ago. The problem is controlled. Recent lipid tests were reviewed and are normal. Exacerbating diseases include diabetes. He has no history of hypothyroidism. Factors aggravating his hyperlipidemia include beta blockers. Pertinent negatives include no leg pain, myalgias or shortness of breath. Current antihyperlipidemic treatment includes statins. The current treatment provides significant improvement of lipids. Risk factors for coronary artery disease include diabetes mellitus, dyslipidemia, hypertension, male sex and family history.  Diabetes He presents for his follow-up diabetic visit. He has type 2 diabetes mellitus. His disease course has been stable. Pertinent negatives for hypoglycemia include no confusion, dizziness or headaches. Pertinent negatives for diabetes include no blurred vision, no foot paresthesias and no foot ulcerations. Diabetic complications include heart disease. Pertinent negatives for diabetic complications include no CVA or peripheral neuropathy. Risk factors for coronary artery disease include diabetes mellitus, dyslipidemia, hypertension and male sex. Current diabetic treatment includes diet. He is following a diabetic diet. He participates in exercise daily. His breakfast blood glucose range is generally 90-110 mg/dl. Eye exam is current.  Hypertension This is a chronic problem. The current episode started more than 1 year ago. The problem has been waxing and waning since onset. The problem is uncontrolled. Pertinent negatives include no blurred vision, headaches, palpitations, peripheral edema or shortness of breath. Risk factors for coronary artery disease include diabetes mellitus, dyslipidemia and male gender. Past treatments include beta blockers and diuretics. The current treatment  provides mild improvement. There is no history of kidney disease, CAD/MI, CVA or a thyroid problem.      Review of Systems  HENT: Negative.   Eyes: Negative for blurred vision.  Respiratory: Negative.  Negative for shortness of breath.   Cardiovascular: Negative.  Negative for palpitations.  Gastrointestinal: Negative.   Genitourinary: Negative.   Musculoskeletal: Negative.  Negative for myalgias.  Neurological: Negative for dizziness and headaches.  Hematological: Negative.   Psychiatric/Behavioral: Negative for confusion.  All other systems reviewed and are negative.      Objective:   Physical Exam  Vitals reviewed. Constitutional: He is oriented to person, place, and time. He appears well-developed and well-nourished. No distress.  HENT:  Head: Normocephalic.  Right Ear: External ear normal.  Left Ear: External ear normal.  Mouth/Throat: Oropharynx is clear and moist.  Eyes: Pupils are equal, round, and reactive to light. Right eye exhibits no discharge. Left eye exhibits no discharge.  Neck: Normal range of motion. Neck supple. No thyromegaly present.  Cardiovascular: Normal rate, regular rhythm, normal heart sounds and intact distal pulses.   No murmur heard. Pulmonary/Chest: Effort normal and breath sounds normal. No respiratory distress. He has no wheezes.  Abdominal: Soft. Bowel sounds are normal. He exhibits no distension. There is no tenderness.  Musculoskeletal: Normal range of motion. He exhibits no edema and no tenderness.  Neurological: He is alert and oriented to person, place, and time. He has normal reflexes. No cranial nerve deficit.  Skin: Skin is warm and dry. No rash noted. No erythema.  Psychiatric: He has a normal mood and affect. His behavior is normal. Judgment and thought content normal.    BP 155/82  Pulse 50  Temp(Src) 96.9 F (36.1 C) (Oral)  Ht '5\' 9"'  (1.753 m)  Wt 190 lb (86.183 kg)  BMI 28.05 kg/m2  Results for orders placed in visit  on  11/29/13  POCT GLYCOSYLATED HEMOGLOBIN (HGB A1C)      Result Value Ref Range   Hemoglobin A1C 4.8%    POCT UA - MICROALBUMIN      Result Value Ref Range   Microalbumin Ur, POC negative          Assessment & Plan:  1. Hyperglycemia - POCT glycosylated hemoglobin (Hb A1C) - POCT UA - Microalbumin - CMP14+EGFR - Lipid panel  2. Other and unspecified hyperlipidemia - CMP14+EGFR - Lipid panel  3. Essential hypertension, benign -Pt to come back in 2 weeks for B/P check by nurse -Coreg increased to from 3.125 mg to 6.63m BID - carvedilol (COREG) 6.25 MG tablet; Take 1 tablet (6.25 mg total) by mouth 2 (two) times daily with a meal.  Dispense: 180 tablet; Refill: 1  4. Diabetes type 2, uncontrolled  5. CAD, multiple vessel - isosorbide mononitrate (IMDUR) 60 MG 24 hr tablet; Take 1 tablet (60 mg total) by mouth daily.  Dispense: 90 tablet; Refill: 3   Continue all meds Labs pending Health Maintenance reviewed-Zostavax given today Diet and exercise encouraged RTO 3 months-Pt to return in 2 weeks to get blood pressure check by nurse  CEvelina Dun FNP

## 2013-11-29 NOTE — Patient Instructions (Addendum)
Hypertension Hypertension, commonly called high blood pressure, is when the force of blood pumping through your arteries is too strong. Your arteries are the blood vessels that carry blood from your heart throughout your body. A blood pressure reading consists of a higher number over a lower number, such as 110/72. The higher number (systolic) is the pressure inside your arteries when your heart pumps. The lower number (diastolic) is the pressure inside your arteries when your heart relaxes. Ideally you want your blood pressure below 120/80. Hypertension forces your heart to work harder to pump blood. Your arteries may become narrow or stiff. Having hypertension puts you at risk for heart disease, stroke, and other problems.  RISK FACTORS Some risk factors for high blood pressure are controllable. Others are not.  Risk factors you cannot control include:   Race. You may be at higher risk if you are African American.  Age. Risk increases with age.  Gender. Men are at higher risk than women before age 45 years. After age 65, women are at higher risk than men. Risk factors you can control include:  Not getting enough exercise or physical activity.  Being overweight.  Getting too much fat, sugar, calories, or salt in your diet.  Drinking too much alcohol. SIGNS AND SYMPTOMS Hypertension does not usually cause signs or symptoms. Extremely high blood pressure (hypertensive crisis) may cause headache, anxiety, shortness of breath, and nosebleed. DIAGNOSIS  To check if you have hypertension, your health care provider will measure your blood pressure while you are seated, with your arm held at the level of your heart. It should be measured at least twice using the same arm. Certain conditions can cause a difference in blood pressure between your right and left arms. A blood pressure reading that is higher than normal on one occasion does not mean that you need treatment. If one blood pressure reading  is high, ask your health care provider about having it checked again. TREATMENT  Treating high blood pressure includes making lifestyle changes and possibly taking medication. Living a healthy lifestyle can help lower high blood pressure. You may need to change some of your habits. Lifestyle changes may include:  Following the DASH diet. This diet is high in fruits, vegetables, and whole grains. It is low in salt, red meat, and added sugars.  Getting at least 2 1/2 hours of brisk physical activity every week.  Losing weight if necessary.  Not smoking.  Limiting alcoholic beverages.  Learning ways to reduce stress. If lifestyle changes are not enough to get your blood pressure under control, your health care provider may prescribe medicine. You may need to take more than one. Work closely with your health care provider to understand the risks and benefits. HOME CARE INSTRUCTIONS  Have your blood pressure rechecked as directed by your health care provider.   Only take medicine as directed by your health care provider. Follow the directions carefully. Blood pressure medicines must be taken as prescribed. The medicine does not work as well when you skip doses. Skipping doses also puts you at risk for problems.   Do not smoke.   Monitor your blood pressure at home as directed by your health care provider. SEEK MEDICAL CARE IF:   You think you are having a reaction to medicines taken.  You have recurrent headaches or feel dizzy.  You have swelling in your ankles.  You have trouble with your vision. SEEK IMMEDIATE MEDICAL CARE IF:  You develop a severe headache or   confusion.  You have unusual weakness, numbness, or feel faint.  You have severe chest or abdominal pain.  You vomit repeatedly.  You have trouble breathing. MAKE SURE YOU:   Understand these instructions.  Will watch your condition.  Will get help right away if you are not doing well or get  worse. Document Released: 05/20/2005 Document Revised: 05/25/2013 Document Reviewed: 03/12/2013 [facility] Patient Information 2015 [place], [place]. This information is not intended to replace advice given to you by your health care provider. Make sure you discuss any questions you have with your health care provider.   Herpes Zoster Virus Vaccine What is this medicine? HERPES ZOSTER VIRUS VACCINE (HUR peez ZOS ter vahy ruhs vak SEEN) is a vaccine. It is used to prevent shingles in adults 78 years old and over. This vaccine is not used to treat shingles or nerve pain from shingles. This medicine may be used for other purposes; ask your health care provider or pharmacist if you have questions. COMMON BRAND NAME(S): Varivax, Zostavax What should I tell my health care provider before I take this medicine? They need to know if you have any of these conditions: -cancer like leukemia or lymphoma -immune system problems or therapy -infection with fever -tuberculosis -an unusual or allergic reaction to vaccines, neomycin, gelatin, other medicines, foods, dyes, or preservatives -pregnant or trying to get pregnant -breast-feeding How should I use this medicine? This vaccine is for injection under the skin. It is given by a health care professional. Talk to your pediatrician regarding the use of this medicine in children. This medicine is not approved for use in children. Overdosage: If you think you have taken too much of this medicine contact a poison control center or emergency room at once. NOTE: This medicine is only for you. Do not share this medicine with others. What if I miss a dose? This does not apply. What may interact with this medicine? Do not take this medicine with any of the following medications: -adalimumab -anakinra -etanercept -infliximab -medicines to treat cancer -medicines that suppress your immune system This medicine may also interact with the following  medications: -immunoglobulins -steroid medicines like prednisone or cortisone This list may not describe all possible interactions. Give your health care provider a list of all the medicines, herbs, non-prescription drugs, or dietary supplements you use. Also tell them if you smoke, drink alcohol, or use illegal drugs. Some items may interact with your medicine. What should I watch for while using this medicine? Visit your doctor for regular check ups. This vaccine, like all vaccines, may not fully protect everyone. After receiving this vaccine it may be possible to pass chickenpox infection to others. Avoid people with immune system problems, pregnant women who have not had chickenpox, and newborns of women who have not had chickenpox. Talk to your doctor for more information. What side effects may I notice from receiving this medicine? Side effects that you should report to your doctor or health care professional as soon as possible: -allergic reactions like skin rash, itching or hives, swelling of the face, lips, or tongue -breathing problems -feeling faint or lightheaded, falls -fever, flu-like symptoms -pain, tingling, numbness in the hands or feet -swelling of the ankles, feet, hands -unusually weak or tired Side effects that usually do not require medical attention (report to your doctor or health care professional if they continue or are bothersome): -aches or pains -chickenpox-like rash -diarrhea -headache -loss of appetite -nausea, vomiting -redness, pain, swelling at site where injected -runny  nose This list may not describe all possible side effects. Call your doctor for medical advice about side effects. You may report side effects to FDA at 1-800-FDA-1088. Where should I keep my medicine? This drug is given in a hospital or clinic and will not be stored at home. NOTE: This sheet is a summary. It may not cover all possible information. If you have questions about this  medicine, talk to your doctor, pharmacist, or health care provider.  2015, [employer]. (2009-11-06 17:43:50)

## 2013-11-30 LAB — CMP14+EGFR
A/G RATIO: 1.8 (ref 1.1–2.5)
ALT: 16 IU/L (ref 0–44)
AST: 22 IU/L (ref 0–40)
Albumin: 4.4 g/dL (ref 3.5–4.8)
Alkaline Phosphatase: 63 IU/L (ref 39–117)
BUN/Creatinine Ratio: 16 (ref 10–22)
BUN: 18 mg/dL (ref 8–27)
CO2: 26 mmol/L (ref 18–29)
Calcium: 8.9 mg/dL (ref 8.6–10.2)
Chloride: 100 mmol/L (ref 97–108)
Creatinine, Ser: 1.11 mg/dL (ref 0.76–1.27)
GFR calc Af Amer: 74 mL/min/{1.73_m2} (ref >59)
GFR, EST NON AFRICAN AMERICAN: 64 mL/min/{1.73_m2} (ref >59)
GLUCOSE: 105 mg/dL — AB (ref 65–99)
Globulin, Total: 2.4 g/dL (ref 1.5–4.5)
Potassium: 4.5 mmol/L (ref 3.5–5.2)
Sodium: 140 mmol/L (ref 134–144)
TOTAL PROTEIN: 6.8 g/dL (ref 6.0–8.5)
Total Bilirubin: 0.6 mg/dL (ref 0.0–1.2)

## 2013-11-30 LAB — LIPID PANEL
Chol/HDL Ratio: 2.2 ratio units (ref 0.0–5.0)
Cholesterol, Total: 138 mg/dL (ref 100–199)
HDL: 62 mg/dL (ref >39)
LDL Calculated: 66 mg/dL (ref 0–99)
TRIGLYCERIDES: 48 mg/dL (ref 0–149)
VLDL Cholesterol Cal: 10 mg/dL (ref 5–40)

## 2013-12-13 ENCOUNTER — Ambulatory Visit: Payer: Medicare HMO | Admitting: *Deleted

## 2013-12-13 VITALS — BP 130/82 | HR 62

## 2013-12-13 DIAGNOSIS — I10 Essential (primary) hypertension: Secondary | ICD-10-CM

## 2013-12-13 NOTE — Progress Notes (Signed)
Patient came in today for a BP check per Evelina Dun, FNP. Patient BP 130/82  Pulse 62. I advised patient that I would send this over to [facility] to review and if any changes needed to be made we would contact the patient

## 2014-01-19 ENCOUNTER — Encounter: Payer: Self-pay | Admitting: Gastroenterology

## 2014-02-01 ENCOUNTER — Telehealth: Payer: Self-pay | Admitting: Family

## 2014-02-01 DIAGNOSIS — E1165 Type 2 diabetes mellitus with hyperglycemia: Secondary | ICD-10-CM

## 2014-02-01 DIAGNOSIS — IMO0002 Reserved for concepts with insufficient information to code with codable children: Secondary | ICD-10-CM

## 2014-02-01 MED ORDER — GLUCOSE BLOOD VI STRP: ORAL_STRIP | Status: DC

## 2014-02-01 NOTE — Telephone Encounter (Signed)
done

## 2014-02-03 ENCOUNTER — Telehealth: Payer: Self-pay | Admitting: *Deleted

## 2014-02-03 ENCOUNTER — Other Ambulatory Visit: Payer: Self-pay

## 2014-02-03 ENCOUNTER — Other Ambulatory Visit: Payer: Self-pay | Admitting: Family Medicine

## 2014-02-03 MED ORDER — GLUCOSE BLOOD VI STRP: ORAL_STRIP | Status: DC

## 2014-02-03 NOTE — Telephone Encounter (Signed)
Script for test strips ready.

## 2014-02-28 ENCOUNTER — Encounter: Payer: Self-pay | Admitting: Family

## 2014-02-28 ENCOUNTER — Ambulatory Visit (INDEPENDENT_AMBULATORY_CARE_PROVIDER_SITE_OTHER): Payer: Medicare HMO | Admitting: Family

## 2014-02-28 VITALS — BP 129/64 | HR 56 | Temp 97.5°F | Ht 69.0 in | Wt 186.4 lb

## 2014-02-28 DIAGNOSIS — I2510 Atherosclerotic heart disease of native coronary artery without angina pectoris: Secondary | ICD-10-CM

## 2014-02-28 DIAGNOSIS — IMO0002 Reserved for concepts with insufficient information to code with codable children: Secondary | ICD-10-CM

## 2014-02-28 DIAGNOSIS — Z1321 Encounter for screening for nutritional disorder: Secondary | ICD-10-CM

## 2014-02-28 DIAGNOSIS — IMO0001 Reserved for inherently not codable concepts without codable children: Secondary | ICD-10-CM

## 2014-02-28 DIAGNOSIS — I10 Essential (primary) hypertension: Secondary | ICD-10-CM

## 2014-02-28 DIAGNOSIS — I739 Peripheral vascular disease, unspecified: Secondary | ICD-10-CM

## 2014-02-28 DIAGNOSIS — E785 Hyperlipidemia, unspecified: Secondary | ICD-10-CM

## 2014-02-28 DIAGNOSIS — E1165 Type 2 diabetes mellitus with hyperglycemia: Secondary | ICD-10-CM

## 2014-02-28 MED ORDER — ATORVASTATIN CALCIUM 20 MG PO TABS: 20.0000 mg | ORAL_TABLET | Freq: Every day | ORAL | Status: DC

## 2014-02-28 MED ORDER — CILOSTAZOL 100 MG PO TABS: 100.0000 mg | ORAL_TABLET | Freq: Two times a day (BID) | ORAL | Status: DC

## 2014-02-28 MED ORDER — ISOSORBIDE MONONITRATE ER 60 MG PO TB24: 60.0000 mg | ORAL_TABLET | Freq: Every day | ORAL | Status: DC

## 2014-02-28 MED ORDER — DOXAZOSIN MESYLATE 8 MG PO TABS: 8.0000 mg | ORAL_TABLET | Freq: Every day | ORAL | Status: DC

## 2014-02-28 MED ORDER — VALSARTAN-HYDROCHLOROTHIAZIDE 320-25 MG PO TABS: ORAL_TABLET | ORAL | Status: DC

## 2014-02-28 MED ORDER — CARVEDILOL 6.25 MG PO TABS: 6.2500 mg | ORAL_TABLET | Freq: Two times a day (BID) | ORAL | Status: DC

## 2014-02-28 NOTE — Progress Notes (Signed)
Subjective:    Patient ID: Joseph Byrd, male    DOB: [birth year], 80 y.o.   MRN: [id number]  Diabetes He presents for his follow-up diabetic visit. He has type 2 diabetes mellitus. His disease course has been stable. Pertinent negatives for hypoglycemia include no confusion, dizziness or headaches. Pertinent negatives for diabetes include no blurred vision, no foot paresthesias and no foot ulcerations. Diabetic complications include heart disease. Pertinent negatives for diabetic complications include no CVA, nephropathy or peripheral neuropathy. Risk factors for coronary artery disease include diabetes mellitus, dyslipidemia, hypertension and male sex. Current diabetic treatment includes diet. He is following a diabetic diet. He participates in exercise daily. His breakfast blood glucose range is generally 90-110 mg/dl. Eye exam is current.  Hypertension This is a chronic problem. The current episode started more than 1 year ago. The problem has been waxing and waning since onset. The problem is controlled. Pertinent negatives include no blurred vision, headaches, palpitations, peripheral edema or shortness of breath. Risk factors for coronary artery disease include diabetes mellitus, dyslipidemia and male gender. Past treatments include beta blockers, diuretics and angiotensin blockers. The current treatment provides significant improvement. There is no history of kidney disease, CAD/MI, CVA, heart failure or a thyroid problem.  Hyperlipidemia This is a chronic problem. The current episode started more than 1 year ago. The problem is controlled. Recent lipid tests were reviewed and are normal. Exacerbating diseases include diabetes. He has no history of hypothyroidism. Factors aggravating his hyperlipidemia include beta blockers. Pertinent negatives include no leg pain, myalgias or shortness of breath. Current antihyperlipidemic treatment includes statins. The current treatment provides  significant improvement of lipids. Risk factors for coronary artery disease include diabetes mellitus, dyslipidemia, hypertension, male sex and family history.      Review of Systems  Constitutional: Negative.   HENT: Negative.   Eyes: Negative for blurred vision.  Respiratory: Negative.  Negative for shortness of breath.   Cardiovascular: Negative.  Negative for palpitations.  Gastrointestinal: Negative.   Endocrine: Negative.   Musculoskeletal: Negative.  Negative for myalgias.  Neurological: Negative.  Negative for dizziness and headaches.  Hematological: Negative.   Psychiatric/Behavioral: Negative.  Negative for confusion.  All other systems reviewed and are negative.      Objective:   Physical Exam  Vitals reviewed. Constitutional: He is oriented to person, place, and time. He appears well-developed and well-nourished. No distress.  HENT:  Head: Normocephalic.  Right Ear: External ear normal.  Left Ear: External ear normal.  Nose: Nose normal.  Mouth/Throat: Oropharynx is clear and moist.  Eyes: Pupils are equal, round, and reactive to light. Right eye exhibits no discharge. Left eye exhibits no discharge.  Neck: Normal range of motion. Neck supple. No thyromegaly present.  Cardiovascular: Normal rate, regular rhythm, normal heart sounds and intact distal pulses.   No murmur heard. Pulmonary/Chest: Effort normal and breath sounds normal. No respiratory distress. He has no wheezes.  Abdominal: Soft. Bowel sounds are normal. He exhibits no distension. There is no tenderness.  Musculoskeletal: Normal range of motion. He exhibits no edema and no tenderness.  Neurological: He is alert and oriented to person, place, and time. He has normal reflexes. No cranial nerve deficit.  Skin: Skin is warm and dry. No rash noted. No erythema.  Psychiatric: He has a normal mood and affect. His behavior is normal. Judgment and thought content normal.      BP 129/64  Pulse 56   Temp(Src) 97.5 F (36.4 C) (Oral)  Ht 5'  9" (1.753 m)  Wt 186 lb 6.4 oz (84.55 kg)  BMI 27.51 kg/m2     Assessment & Plan:  1. PVD (peripheral vascular disease) with claudication - CMP14+EGFR  2. Essential hypertension, benign - CMP14+EGFR - carvedilol (COREG) 6.25 MG tablet; Take 1 tablet (6.25 mg total) by mouth 2 (two) times daily with a meal.  Dispense: 180 tablet; Refill: 4 - valsartan-hydrochlorothiazide (DIOVAN-HCT) 320-25 MG per tablet; TAKE ONE TABLET BY MOUTH ONE TIME DAILY  Dispense: 90 tablet; Refill: 4 - doxazosin (CARDURA) 8 MG tablet; Take 1 tablet (8 mg total) by mouth at bedtime.  Dispense: 90 tablet; Refill: 4  3. Hyperlipidemia with target LDL less than 70 - CMP14+EGFR - atorvastatin (LIPITOR) 20 MG tablet; Take 1 tablet (20 mg total) by mouth daily.  Dispense: 90 tablet; Refill: 3  4. Diabetes type 2, uncontrolled - CMP14+EGFR - cilostazol (PLETAL) 100 MG tablet; Take 1 tablet (100 mg total) by mouth 2 (two) times daily.  Dispense: 180 tablet; Refill: 3  5. CAD, multiple vessel - CMP14+EGFR - isosorbide mononitrate (IMDUR) 60 MG 24 hr tablet; Take 1 tablet (60 mg total) by mouth daily.  Dispense: 90 tablet; Refill: 4 - atorvastatin (LIPITOR) 20 MG tablet; Take 1 tablet (20 mg total) by mouth daily.  Dispense: 90 tablet; Refill: 3  6. Encounter for vitamin deficiency screening - Vit D  25 hydroxy (rtn osteoporosis monitoring)   Continue all meds Labs pending Health Maintenance reviewed Diet and exercise encouraged RTO 3 months  Evelina Dun, FNP

## 2014-02-28 NOTE — Patient Instructions (Signed)

## 2014-03-01 LAB — CMP14+EGFR
A/G RATIO: 1.6 (ref 1.1–2.5)
ALT: 14 IU/L (ref 0–44)
AST: 16 IU/L (ref 0–40)
Albumin: 4.1 g/dL (ref 3.5–4.8)
Alkaline Phosphatase: 63 IU/L (ref 39–117)
BUN/Creatinine Ratio: 18 (ref 10–22)
BUN: 19 mg/dL (ref 8–27)
CO2: 24 mmol/L (ref 18–29)
CREATININE: 1.07 mg/dL (ref 0.76–1.27)
Calcium: 9.5 mg/dL (ref 8.6–10.2)
Chloride: 101 mmol/L (ref 97–108)
GFR calc Af Amer: 77 mL/min/{1.73_m2} (ref >59)
GFR, EST NON AFRICAN AMERICAN: 67 mL/min/{1.73_m2} (ref >59)
GLOBULIN, TOTAL: 2.6 g/dL (ref 1.5–4.5)
GLUCOSE: 84 mg/dL (ref 65–99)
Potassium: 4.3 mmol/L (ref 3.5–5.2)
Sodium: 139 mmol/L (ref 134–144)
Total Bilirubin: 0.7 mg/dL (ref 0.0–1.2)
Total Protein: 6.7 g/dL (ref 6.0–8.5)

## 2014-03-01 LAB — VITAMIN D 25 HYDROXY (VIT D DEFICIENCY, FRACTURES): Vit D, 25-Hydroxy: 38.8 ng/mL (ref 30.0–100.0)

## 2014-04-01 ENCOUNTER — Other Ambulatory Visit: Payer: Self-pay | Admitting: *Deleted

## 2014-04-01 DIAGNOSIS — E1165 Type 2 diabetes mellitus with hyperglycemia: Secondary | ICD-10-CM

## 2014-04-01 DIAGNOSIS — IMO0002 Reserved for concepts with insufficient information to code with codable children: Secondary | ICD-10-CM

## 2014-04-01 MED ORDER — CILOSTAZOL 100 MG PO TABS: 100.0000 mg | ORAL_TABLET | Freq: Two times a day (BID) | ORAL | Status: DC

## 2014-04-05 ENCOUNTER — Encounter: Payer: Self-pay | Admitting: Nurse Practitioner

## 2014-04-05 ENCOUNTER — Ambulatory Visit (INDEPENDENT_AMBULATORY_CARE_PROVIDER_SITE_OTHER): Payer: Medicare HMO | Admitting: Nurse Practitioner

## 2014-04-05 DIAGNOSIS — R42 Dizziness and giddiness: Secondary | ICD-10-CM

## 2014-04-05 DIAGNOSIS — I10 Essential (primary) hypertension: Secondary | ICD-10-CM

## 2014-04-05 MED ORDER — CARVEDILOL 3.125 MG PO TABS: 3.1250 mg | ORAL_TABLET | Freq: Two times a day (BID) | ORAL | Status: DC

## 2014-04-05 MED ORDER — VALSARTAN-HYDROCHLOROTHIAZIDE 320-25 MG PO TABS: ORAL_TABLET | ORAL | Status: DC

## 2014-04-05 NOTE — Patient Instructions (Signed)

## 2014-04-05 NOTE — Progress Notes (Signed)
   Subjective:    Patient ID: Joseph Byrd, male    DOB: [birth year], 80 y.o.   MRN: [id number]  HPI Patient in c/o dizziness when he stands up. He is on coreg and diovan/hctz- was on coreg 3.25 and was increased to 6.25 and that is when the dizziness started.    Review of Systems  Constitutional: Negative.   HENT: Negative.   Respiratory: Negative.   Cardiovascular: Negative.   Genitourinary: Negative.   Neurological: Positive for dizziness.  Psychiatric/Behavioral: Negative.   All other systems reviewed and are negative.      Objective:   Physical Exam  Constitutional: He is oriented to person, place, and time. He appears well-developed and well-nourished.  Cardiovascular: Normal rate, regular rhythm and normal heart sounds.   Pulmonary/Chest: Effort normal and breath sounds normal.  Neurological: He is alert and oriented to person, place, and time.  Skin: Skin is warm and dry.  Psychiatric: He has a normal mood and affect. His behavior is normal. Judgment and thought content normal.    BP 98/62 mmHg  Pulse 78  Temp(Src) 97.3 F (36.3 C) (Oral)  Ht 5\' 9"  (1.753 m)  Wt 187 lb (84.823 kg)  BMI 27.60 kg/m2       Assessment & Plan:   1. Episode of dizziness   2. Essential hypertension, benign    Meds ordered this encounter  Medications  . carvedilol (COREG) 3.125 MG tablet    Sig: Take 1 tablet (3.125 mg total) by mouth 2 (two) times daily with a meal.    Dispense:  60 tablet    Refill:  3    Order Specific Question:  Supervising Provider    Answer:  Chipper Herb [[id number]]  . valsartan-hydrochlorothiazide (DIOVAN-HCT) 320-25 MG per tablet    Sig: TAKE ONE TABLET BY MOUTH ONE TIME DAILY    Dispense:  90 tablet    Refill:  4    Order Specific Question:  Supervising Provider    Answer:  Chipper Herb [[id number]]   Decreased dose of carvedilol to 3.125mg  BID RTO for routine follow up with C. Helen Hashimoto, FNP

## 2014-04-25 DIAGNOSIS — E1159 Type 2 diabetes mellitus with other circulatory complications: Secondary | ICD-10-CM

## 2014-04-25 DIAGNOSIS — I209 Angina pectoris, unspecified: Secondary | ICD-10-CM

## 2014-04-26 ENCOUNTER — Encounter (HOSPITAL_COMMUNITY)
Admission: RE | Disposition: A | Discharge status: Home / Self Care | Payer: Medicare Other | Source: Ambulatory Visit | Attending: Cardiology

## 2014-04-26 ENCOUNTER — Encounter (HOSPITAL_COMMUNITY): Payer: Self-pay | Admitting: General Practice

## 2014-04-26 ENCOUNTER — Ambulatory Visit (HOSPITAL_COMMUNITY)
Admission: RE | Admit: 2014-04-26 | Discharge: 2014-04-27 | Disposition: A | Discharge status: Home / Self Care | Payer: Medicare HMO | Source: Ambulatory Visit | Attending: Cardiology | Admitting: Cardiology

## 2014-04-26 DIAGNOSIS — I739 Peripheral vascular disease, unspecified: Secondary | ICD-10-CM | POA: Insufficient documentation

## 2014-04-26 DIAGNOSIS — I2510 Atherosclerotic heart disease of native coronary artery without angina pectoris: Secondary | ICD-10-CM

## 2014-04-26 DIAGNOSIS — E1159 Type 2 diabetes mellitus with other circulatory complications: Secondary | ICD-10-CM

## 2014-04-26 DIAGNOSIS — I209 Angina pectoris, unspecified: Secondary | ICD-10-CM

## 2014-04-26 DIAGNOSIS — R079 Chest pain, unspecified: Secondary | ICD-10-CM | POA: Diagnosis present

## 2014-04-26 DIAGNOSIS — Z9861 Coronary angioplasty status: Secondary | ICD-10-CM

## 2014-04-26 DIAGNOSIS — E785 Hyperlipidemia, unspecified: Secondary | ICD-10-CM | POA: Diagnosis not present

## 2014-04-26 DIAGNOSIS — I4510 Unspecified right bundle-branch block: Secondary | ICD-10-CM | POA: Diagnosis not present

## 2014-04-26 DIAGNOSIS — Z955 Presence of coronary angioplasty implant and graft: Secondary | ICD-10-CM | POA: Insufficient documentation

## 2014-04-26 DIAGNOSIS — I25119 Atherosclerotic heart disease of native coronary artery with unspecified angina pectoris: Secondary | ICD-10-CM | POA: Insufficient documentation

## 2014-04-26 HISTORY — DX: Headache: R51

## 2014-04-26 HISTORY — PX: LEFT HEART CATHETERIZATION WITH CORONARY ANGIOGRAM: SHX5451

## 2014-04-26 HISTORY — DX: Headache, unspecified: R51.9

## 2014-04-26 HISTORY — DX: Type 2 diabetes mellitus without complications: E11.9

## 2014-04-26 HISTORY — DX: Unspecified osteoarthritis, unspecified site: M19.90

## 2014-04-26 LAB — POCT ACTIVATED CLOTTING TIME
ACTIVATED CLOTTING TIME: 433 s
Activated Clotting Time: 168 seconds

## 2014-04-26 LAB — GLUCOSE, CAPILLARY
GLUCOSE-CAPILLARY: 111 mg/dL — AB (ref 70–99)
GLUCOSE-CAPILLARY: 102 mg/dL — AB (ref 70–99)
GLUCOSE-CAPILLARY: 94 mg/dL (ref 70–99)
Glucose-Capillary: 104 mg/dL — ABNORMAL HIGH (ref 70–99)

## 2014-04-26 SURGERY — LEFT HEART CATHETERIZATION WITH CORONARY ANGIOGRAM
Anesthesia: LOCAL

## 2014-04-26 MED ORDER — CLOPIDOGREL BISULFATE 75 MG PO TABS
ORAL_TABLET | ORAL | Status: AC
  Filled 2014-04-26: qty 1

## 2014-04-26 MED ORDER — ASPIRIN 81 MG PO CHEW: 81.0000 mg | CHEWABLE_TABLET | ORAL | Status: DC

## 2014-04-26 MED ORDER — VERAPAMIL HCL 2.5 MG/ML IV SOLN
INTRAVENOUS | Status: AC
  Filled 2014-04-26: qty 2

## 2014-04-26 MED ORDER — DOXAZOSIN MESYLATE 8 MG PO TABS
8.0000 mg | ORAL_TABLET | Freq: Every day | ORAL | Status: DC
  Administered 2014-04-26: 22:00:00 8 mg via ORAL
  Filled 2014-04-26 (×2): qty 1

## 2014-04-26 MED ORDER — SODIUM CHLORIDE 0.9 % IJ SOLN: 3.0000 mL | INTRAMUSCULAR | Status: DC | PRN

## 2014-04-26 MED ORDER — SODIUM CHLORIDE 0.9 % IV SOLN: 1.0000 mL/kg/h | INTRAVENOUS | Status: AC

## 2014-04-26 MED ORDER — SODIUM CHLORIDE 0.9 % IV BOLUS (SEPSIS)
500.0000 mL | Freq: Once | INTRAVENOUS | Status: AC
Start: 2014-04-26 — End: 2014-04-26
  Administered 2014-04-26: 500 mL via INTRAVENOUS

## 2014-04-26 MED ORDER — SODIUM CHLORIDE 0.9 % IV SOLN: 250.0000 mL | INTRAVENOUS | Status: DC | PRN

## 2014-04-26 MED ORDER — ACETAMINOPHEN 325 MG PO TABS: 650.0000 mg | ORAL_TABLET | ORAL | Status: DC | PRN

## 2014-04-26 MED ORDER — INFLUENZA VAC SPLIT QUAD 0.5 ML IM SUSY
0.5000 mL | PREFILLED_SYRINGE | INTRAMUSCULAR | Status: AC
  Administered 2014-04-27: 10:00:00 0.5 mL via INTRAMUSCULAR
  Filled 2014-04-26: qty 0.5

## 2014-04-26 MED ORDER — NITROGLYCERIN 0.4 MG SL SUBL: 0.4000 mg | SUBLINGUAL_TABLET | SUBLINGUAL | Status: DC | PRN

## 2014-04-26 MED ORDER — IRBESARTAN 300 MG PO TABS
300.0000 mg | ORAL_TABLET | Freq: Every day | ORAL | Status: DC
  Filled 2014-04-26: qty 1

## 2014-04-26 MED ORDER — NITROGLYCERIN 1 MG/10 ML FOR IR/CATH LAB
INTRA_ARTERIAL | Status: AC
Start: 2014-04-26 — End: 2014-04-26
  Filled 2014-04-26: qty 10

## 2014-04-26 MED ORDER — ONDANSETRON HCL 4 MG/2ML IJ SOLN: 4.0000 mg | Freq: Four times a day (QID) | INTRAMUSCULAR | Status: DC | PRN

## 2014-04-26 MED ORDER — SODIUM CHLORIDE 0.9 % IJ SOLN: 3.0000 mL | Freq: Two times a day (BID) | INTRAMUSCULAR | Status: DC

## 2014-04-26 MED ORDER — IRBESARTAN 300 MG PO TABS
300.0000 mg | ORAL_TABLET | Freq: Every day | ORAL | Status: DC
  Administered 2014-04-27: 10:00:00 300 mg via ORAL
  Filled 2014-04-26: qty 1

## 2014-04-26 MED ORDER — ASPIRIN 81 MG PO CHEW
CHEWABLE_TABLET | ORAL | Status: AC
  Administered 2014-04-26: 81 mg
  Filled 2014-04-26: qty 1

## 2014-04-26 MED ORDER — CLOPIDOGREL BISULFATE 75 MG PO TABS
75.0000 mg | ORAL_TABLET | Freq: Every day | ORAL | Status: DC
  Administered 2014-04-27: 75 mg via ORAL
  Filled 2014-04-26: qty 1

## 2014-04-26 MED ORDER — NITROGLYCERIN 1 MG/10 ML FOR IR/CATH LAB
INTRA_ARTERIAL | Status: AC
  Filled 2014-04-26: qty 10

## 2014-04-26 MED ORDER — LIDOCAINE HCL (PF) 1 % IJ SOLN
INTRAMUSCULAR | Status: AC
  Filled 2014-04-26: qty 30

## 2014-04-26 MED ORDER — ISOSORBIDE MONONITRATE ER 60 MG PO TB24
120.0000 mg | ORAL_TABLET | Freq: Every day | ORAL | Status: DC
  Administered 2014-04-27: 10:00:00 120 mg via ORAL
  Filled 2014-04-26: qty 2

## 2014-04-26 MED ORDER — HEPARIN (PORCINE) IN NACL 2-0.9 UNIT/ML-% IJ SOLN
INTRAMUSCULAR | Status: AC
  Filled 2014-04-26: qty 1000

## 2014-04-26 MED ORDER — MIDAZOLAM HCL 2 MG/2ML IJ SOLN
INTRAMUSCULAR | Status: AC
  Filled 2014-04-26: qty 2

## 2014-04-26 MED ORDER — ASPIRIN 81 MG PO CHEW
81.0000 mg | CHEWABLE_TABLET | Freq: Every day | ORAL | Status: DC
  Administered 2014-04-27: 10:00:00 81 mg via ORAL
  Filled 2014-04-26: qty 1

## 2014-04-26 MED ORDER — ATORVASTATIN CALCIUM 20 MG PO TABS
20.0000 mg | ORAL_TABLET | Freq: Every day | ORAL | Status: DC
  Administered 2014-04-26: 20 mg via ORAL
  Filled 2014-04-26 (×2): qty 1

## 2014-04-26 MED ORDER — HYDROCHLOROTHIAZIDE 25 MG PO TABS
25.0000 mg | ORAL_TABLET | Freq: Every day | ORAL | Status: DC
  Filled 2014-04-26: qty 1

## 2014-04-26 MED ORDER — CARVEDILOL 3.125 MG PO TABS
3.1250 mg | ORAL_TABLET | Freq: Two times a day (BID) | ORAL | Status: DC
  Administered 2014-04-26 – 2014-04-27 (×2): 3.125 mg via ORAL
  Filled 2014-04-26 (×3): qty 1

## 2014-04-26 MED ORDER — VALSARTAN-HYDROCHLOROTHIAZIDE 320-25 MG PO TABS: 1.0000 | ORAL_TABLET | Freq: Every day | ORAL | Status: DC

## 2014-04-26 MED ORDER — SODIUM CHLORIDE 0.9 % IV SOLN: INTRAVENOUS | Status: DC

## 2014-04-26 MED ORDER — CLOPIDOGREL BISULFATE 75 MG PO TABS
ORAL_TABLET | ORAL | Status: AC
  Filled 2014-04-26: qty 5

## 2014-04-26 MED ORDER — HYDROMORPHONE HCL 1 MG/ML IJ SOLN
INTRAMUSCULAR | Status: AC
  Filled 2014-04-26: qty 1

## 2014-04-26 MED ORDER — BIVALIRUDIN 250 MG IV SOLR
INTRAVENOUS | Status: AC
  Filled 2014-04-26: qty 250

## 2014-04-26 MED ORDER — HYDROCHLOROTHIAZIDE 25 MG PO TABS
25.0000 mg | ORAL_TABLET | Freq: Every day | ORAL | Status: DC
  Administered 2014-04-27: 10:00:00 25 mg via ORAL
  Filled 2014-04-26: qty 1

## 2014-04-26 NOTE — Progress Notes (Signed)
TR BAND REMOVAL  LOCATION:    right radial  DEFLATED PER PROTOCOL:    Yes.    TIME BAND OFF / DRESSING APPLIED:    1515   SITE UPON ARRIVAL:    Level 0  SITE AFTER BAND REMOVAL:    Level 0  REVERSE ALLEN'S TEST:     positive  CIRCULATION SENSATION AND MOVEMENT:    Within Normal Limits   Yes.    COMMENTS:   Tolerated procedure well

## 2014-04-26 NOTE — H&P (Signed)
  Please see office visit notes for complete details of HPI.  

## 2014-04-26 NOTE — Interval H&P Note (Signed)
History and Physical Interval Note:  04/26/2014 10:17 AM  Joseph Byrd  has presented today for surgery, with the diagnosis of cp  The various methods of treatment have been discussed with the patient and family. After consideration of risks, benefits and other options for treatment, the patient has consented to  Procedure(s): LEFT HEART CATHETERIZATION WITH CORONARY ANGIOGRAM (N/A) as a surgical intervention .  The patient's history has been reviewed, patient examined, no change in status, stable for surgery.  I have reviewed the patient's chart and labs.  Questions were answered to the patient's satisfaction.   Cath Lab Visit (complete for each Cath Lab visit)  Clinical Evaluation Leading to the Procedure:   ACS: No.  Non-ACS:    Anginal Classification: CCS III  Anti-ischemic medical therapy: Maximal Therapy (2 or more classes of medications)  Non-Invasive Test Results: No non-invasive testing performed  Prior CABG: No previous CABG        [facility]

## 2014-04-26 NOTE — CV Procedure (Signed)
Procedure performed:  Left heart catheterization:  LV hemodynamics, Selective right and left coronary arteriography. PTCA and stenting of the distal dominant circumflex coronary artery with implantation of a 3.5 x 16 mm Promus Premier DES.  Indication: Patient is a 80 year-old African-American male with history of CAD and history of PTCA and stenting to the mid LAD on 08/21/2010 with implantation of a  2.25 x 22 mm Resolute drug. He also has history of peripheral arterial disease and hyperlipidemia. He recently presented to the office with almost class 3-4 angina pectoris, symptoms very similar to his angina pectoris prior to stenting to his LAD. With a suspicion of progression of coronary artery disease was then brought to the coronary angiography suite to evaluate his coronary anatomy.  Hemodynamic data: Left ventricular pressure was 99/2 with LVEDP of 5 mm mercury. Aortic pressure was 100/59 with a mean of 75 mm mercury. There was no pressure gradient across the aortic valve.   Left ventricle: No performed.  Right coronary artery: The vessel is Non-Dominant. Mild chronic Station and mild disease.  Left main coronary artery is large and calcified and is diffuse luminal irregularity without high-grade stenosis.  Circumflex coronary artery: A large vessel giving origin to a large obtuse marginal 1. It is dominant, from the distal end, it gives origin to PDA and several PDA branches. Distal circumflex has a eccentric 80-90% stenosis, again mild calcification is evident. There is mild diffuse luminal irregularity in the proximal circumflex worry artery. Distal PDA has diffuse mild to moderate luminal irregularity.  LAD:  LAD gives origin to several small diagonals, its a large caliber vessel, mildly calcified in the proximal segment with mild 20-30% stenosis. Previously placed mid LAD stent is widely patent.  Interventional data: Successful PTCA and stenting of thedistal dominant circumflex coronary  artery with implantation of a 3.5 x 16 mm Promus Premier DES.  Will need Dual antiplatelet therapy with Plavix and ASA 81 mg for at least 1 year.   Technique of diagnostic cardiac catheterization:  Under sterile precautions using a 6 French right radial  arterial access, a 6 French sheath was introduced into the right radial artery. A 5 [place] Tig 4 catheter was advanced into the ascending aorta selective  right coronary artery and left coronary artery was cannulated and angiography was performed in multiple views. The catheter was pulled back Out of the body over exchange length J-wire. Same Catheter was used to perform LV hemodynamics. Catheter exchanged out of the body over J-Wire. NO immediate complications noted.   Technique of intervention:  Using a 6 [place] XB 3.5 guide catheter theLM  coronary  was selected and cannulated. Using Angiomax for anticoagulation, I utilized a Couger Xt guidewire and across the LM and Circumflex coronary artery without any difficulty. I placed the tip of the wire into the distal  coronary artery. Angiography was performed. I directly stented the stenosis with a 3.5 x 16 mm Promus premier DES at 12 atmospheric pressure for 60 seconds. I have mild difficulty in advancing the stent to the site of stenosis. Patient did experience chest discomfort similar to his angina during  angioplasty.  I then utilized a Camp  emerge 3.25 x 12 mm balloon and angioplasty at the distal segment was performed at 16 atmospheric pressure for 45 seconds and into the proximal segment at 20 atmospheric pressure for 45 seconds. This was followed by intracoronary nitroglycerin administration of oral micrograms and angiography was repeated. Excellent result was evident. The guidewire was withdrawn, the  catheter disengaged and pulled out of the body. Hemostasis achieved with TR band. Patient tolerated the procedure well. A total of 120 mL of contrast was utilized for diagnostic and interventional  procedure.  Disposition: Patient will be discharged in morning unless complications with out-patient follow up.

## 2014-04-27 DIAGNOSIS — I739 Peripheral vascular disease, unspecified: Secondary | ICD-10-CM | POA: Diagnosis not present

## 2014-04-27 DIAGNOSIS — E785 Hyperlipidemia, unspecified: Secondary | ICD-10-CM | POA: Diagnosis not present

## 2014-04-27 DIAGNOSIS — I4510 Unspecified right bundle-branch block: Secondary | ICD-10-CM | POA: Diagnosis not present

## 2014-04-27 DIAGNOSIS — I25119 Atherosclerotic heart disease of native coronary artery with unspecified angina pectoris: Secondary | ICD-10-CM | POA: Diagnosis not present

## 2014-04-27 LAB — CBC
HCT: 35.5 % — ABNORMAL LOW (ref 39.0–52.0)
Hemoglobin: 11.1 g/dL — ABNORMAL LOW (ref 13.0–17.0)
MCH: 29.2 pg (ref 26.0–34.0)
MCHC: 31.3 g/dL (ref 30.0–36.0)
MCV: 93.4 fL (ref 78.0–100.0)
PLATELETS: 407 10*3/uL — AB (ref 150–400)
RBC: 3.8 MIL/uL — ABNORMAL LOW (ref 4.22–5.81)
RDW: 13.4 % (ref 11.5–15.5)
WBC: 5.6 10*3/uL (ref 4.0–10.5)

## 2014-04-27 LAB — BASIC METABOLIC PANEL
Anion gap: 10 (ref 5–15)
BUN: 13 mg/dL (ref 6–23)
CALCIUM: 8.7 mg/dL (ref 8.4–10.5)
CO2: 27 mEq/L (ref 19–32)
Chloride: 104 mEq/L (ref 96–112)
Creatinine, Ser: 1 mg/dL (ref 0.50–1.35)
GFR calc Af Amer: 82 mL/min — ABNORMAL LOW (ref >90)
GFR, EST NON AFRICAN AMERICAN: 70 mL/min — AB (ref >90)
Glucose, Bld: 90 mg/dL (ref 70–99)
Potassium: 3.8 mEq/L (ref 3.7–5.3)
SODIUM: 141 meq/L (ref 137–147)

## 2014-04-27 LAB — GLUCOSE, CAPILLARY: Glucose-Capillary: 106 mg/dL — ABNORMAL HIGH (ref 70–99)

## 2014-04-27 MED ORDER — CLOPIDOGREL BISULFATE 75 MG PO TABS: 75.0000 mg | ORAL_TABLET | Freq: Every day | ORAL | Status: DC

## 2014-04-27 MED FILL — Sodium Chloride IV Soln 0.9%: INTRAVENOUS | Qty: 50 | Status: AC

## 2014-04-27 NOTE — Progress Notes (Signed)
CARDIAC REHAB PHASE I   PRE:  Rate/Rhythm: 55 SB    BP: sitting 111/49    SaO2:   MODE:  Ambulation: 500 ft   POST:  Rate/Rhythm: 85 Sr    BP: sitting 115/60     SaO2:   Tolerated well, no c/o. Ed completed with good reception. Not interested in CRPII, will do on his own. Has stationary bike.  [id number]  Darrick Meigs CES, ACSM 04/27/2014 8:20 AM

## 2014-05-12 ENCOUNTER — Encounter (HOSPITAL_COMMUNITY): Payer: Self-pay | Admitting: Cardiology

## 2014-08-02 ENCOUNTER — Other Ambulatory Visit: Payer: Self-pay | Admitting: Family Medicine

## 2014-08-30 ENCOUNTER — Encounter: Payer: Self-pay | Admitting: Family

## 2014-08-30 ENCOUNTER — Ambulatory Visit (INDEPENDENT_AMBULATORY_CARE_PROVIDER_SITE_OTHER): Payer: Medicare HMO | Admitting: Family

## 2014-08-30 VITALS — BP 124/74 | HR 76 | Temp 97.5°F | Ht 69.0 in | Wt 190.8 lb

## 2014-08-30 DIAGNOSIS — I209 Angina pectoris, unspecified: Secondary | ICD-10-CM | POA: Diagnosis not present

## 2014-08-30 DIAGNOSIS — M170 Bilateral primary osteoarthritis of knee: Secondary | ICD-10-CM

## 2014-08-30 DIAGNOSIS — I739 Peripheral vascular disease, unspecified: Secondary | ICD-10-CM

## 2014-08-30 DIAGNOSIS — I10 Essential (primary) hypertension: Secondary | ICD-10-CM

## 2014-08-30 DIAGNOSIS — Z23 Encounter for immunization: Secondary | ICD-10-CM

## 2014-08-30 DIAGNOSIS — H8110 Benign paroxysmal vertigo, unspecified ear: Secondary | ICD-10-CM | POA: Diagnosis not present

## 2014-08-30 DIAGNOSIS — E785 Hyperlipidemia, unspecified: Secondary | ICD-10-CM

## 2014-08-30 DIAGNOSIS — E559 Vitamin D deficiency, unspecified: Secondary | ICD-10-CM | POA: Insufficient documentation

## 2014-08-30 DIAGNOSIS — E1159 Type 2 diabetes mellitus with other circulatory complications: Secondary | ICD-10-CM | POA: Diagnosis not present

## 2014-08-30 DIAGNOSIS — I2510 Atherosclerotic heart disease of native coronary artery without angina pectoris: Secondary | ICD-10-CM | POA: Diagnosis not present

## 2014-08-30 DIAGNOSIS — M129 Arthropathy, unspecified: Secondary | ICD-10-CM

## 2014-08-30 DIAGNOSIS — E1165 Type 2 diabetes mellitus with hyperglycemia: Secondary | ICD-10-CM

## 2014-08-30 DIAGNOSIS — IMO0002 Reserved for concepts with insufficient information to code with codable children: Secondary | ICD-10-CM

## 2014-08-30 LAB — POCT UA - MICROALBUMIN: Microalbumin Ur, POC: 20 mg/L

## 2014-08-30 LAB — POCT GLYCOSYLATED HEMOGLOBIN (HGB A1C)

## 2014-08-30 MED ORDER — MECLIZINE HCL 50 MG PO TABS
50.0000 mg | ORAL_TABLET | Freq: Three times a day (TID) | ORAL | Status: DC | PRN
Start: 2014-08-30 — End: 2014-09-22

## 2014-08-30 NOTE — Addendum Note (Signed)
Addended by: Earlene Plater on: 08/30/2014 12:24 PM   Modules accepted: Orders

## 2014-08-30 NOTE — Addendum Note (Signed)
Addended by: Shelbie Ammons on: 08/30/2014 12:08 PM   Modules accepted: Orders

## 2014-08-30 NOTE — Patient Instructions (Addendum)
Vertigo Vertigo means you feel like you are moving when you are not. Vertigo can make you feel like things around you are moving when they are not. This problem often goes away on its own.  HOME CARE   Follow your doctor's instructions.  Avoid driving.  Avoid using heavy machinery.  Avoid doing any activity that could be dangerous if you have a vertigo attack.  Tell your doctor if a medicine seems to cause your vertigo. GET HELP RIGHT AWAY IF:   Your medicines do not help or make you feel worse.  You have trouble talking or walking.  You feel weak or have trouble using your arms, hands, or legs.  You have bad headaches.  You keep feeling sick to your stomach (nauseous) or throwing up (vomiting).  Your vision changes.  A family member notices changes in your behavior.  Your problems get worse. MAKE SURE YOU:  Understand these instructions.  Will watch your condition.  Will get help right away if you are not doing well or get worse. Document Released: 02/27/2008 Document Revised: 08/12/2011 Document Reviewed: 12/06/2010 [facility] Patient Information 2015 [place], [place]. This information is not intended to replace advice given to you by your health care provider. Make sure you discuss any questions you have with your health care provider. Benign Positional Vertigo Vertigo means you feel like you or your surroundings are moving when they are not. Benign positional vertigo is the most common form of vertigo. Benign means that the cause of your condition is not serious. Benign positional vertigo is more common in older adults. CAUSES  Benign positional vertigo is the result of an upset in the labyrinth system. This is an area in the middle ear that helps control your balance. This may be caused by a viral infection, head injury, or repetitive motion. However, often no specific cause is found. SYMPTOMS  Symptoms of benign positional vertigo occur when you move your head or eyes  in different directions. Some of the symptoms may include:  Loss of balance and falls.  Vomiting.  Blurred vision.  Dizziness.  Nausea.  Involuntary eye movements (nystagmus). DIAGNOSIS  Benign positional vertigo is usually diagnosed by physical exam. If the specific cause of your benign positional vertigo is unknown, your caregiver may perform imaging tests, such as magnetic resonance imaging (MRI) or computed tomography (CT). TREATMENT  Your caregiver may recommend movements or procedures to correct the benign positional vertigo. Medicines such as meclizine, benzodiazepines, and medicines for nausea may be used to treat your symptoms. In rare cases, if your symptoms are caused by certain conditions that affect the inner ear, you may need surgery. HOME CARE INSTRUCTIONS   Follow your caregiver's instructions.  Move slowly. Do not make sudden body or head movements.  Avoid driving.  Avoid operating heavy machinery.  Avoid performing any tasks that would be dangerous to you or others during a vertigo episode.  Drink enough fluids to keep your urine clear or pale yellow. SEEK IMMEDIATE MEDICAL CARE IF:   You develop problems with walking, weakness, numbness, or using your arms, hands, or legs.  You have difficulty speaking.  You develop severe headaches.  Your nausea or vomiting continues or gets worse.  You develop visual changes.  Your family or friends notice any behavioral changes.  Your condition gets worse.  You have a fever.  You develop a stiff neck or sensitivity to light. MAKE SURE YOU:   Understand these instructions.  Will watch your condition.  Will  get help right away if you are not doing well or get worse. Document Released: 02/25/2006 Document Revised: 08/12/2011 Document Reviewed: 02/07/2011 [facility] Patient Information 2015 [place], [place]. This information is not intended to replace advice given to you by your health care provider. Make sure  you discuss any questions you have with your health care provider.

## 2014-08-30 NOTE — Progress Notes (Signed)
Subjective:    Patient ID: Joseph Byrd, male    DOB: 09-Jan-1934, 81 y.o.   MRN: [id number]  Diabetes He presents for his follow-up diabetic visit. He has type 2 diabetes mellitus. His disease course has been stable. Hypoglycemia symptoms include dizziness and headaches. Pertinent negatives for hypoglycemia include no confusion. Pertinent negatives for diabetes include no blurred vision, no foot paresthesias and no foot ulcerations. Diabetic complications include heart disease. Pertinent negatives for diabetic complications include no CVA, nephropathy or peripheral neuropathy. Risk factors for coronary artery disease include diabetes mellitus, dyslipidemia, hypertension and male sex. Current diabetic treatment includes diet. He is following a diabetic diet. He participates in exercise daily. His breakfast blood glucose range is generally 90-110 mg/dl. An ACE inhibitor/angiotensin II receptor blocker is being taken. Eye exam is current.  Dizziness This is a new problem. The current episode started in the past 7 days (Last Thursday). The problem occurs constantly. The problem has been waxing and waning. Associated symptoms include headaches. Pertinent negatives include no chills, congestion, myalgias or sore throat. Treatments tried: "motion sickness medicaiton OTC" The treatment provided mild relief.  Hypertension This is a chronic problem. The current episode started more than 1 year ago. The problem has been resolved since onset. Associated symptoms include headaches. Pertinent negatives include no blurred vision, palpitations, peripheral edema or shortness of breath. Risk factors for coronary artery disease include diabetes mellitus, dyslipidemia, family history and male gender. Past treatments include angiotensin blockers, beta blockers and diuretics. The current treatment provides moderate improvement. There is no history of kidney disease, CAD/MI, CVA or heart failure.  Hyperlipidemia This is  a chronic problem. The current episode started more than 1 year ago. The problem is controlled. Recent lipid tests were reviewed and are normal. Exacerbating diseases include diabetes. Pertinent negatives include no leg pain, myalgias or shortness of breath. Current antihyperlipidemic treatment includes statins. The current treatment provides moderate improvement of lipids. Risk factors for coronary artery disease include diabetes mellitus, dyslipidemia, family history, hypertension and male sex.      Review of Systems  Constitutional: Negative.  Negative for chills.  HENT: Negative for congestion and sore throat.   Eyes: Negative for blurred vision.  Respiratory: Negative.  Negative for shortness of breath.   Cardiovascular: Negative.  Negative for palpitations.  Gastrointestinal: Negative.   Endocrine: Negative.   Genitourinary: Negative.   Musculoskeletal: Negative.  Negative for myalgias.  Neurological: Positive for dizziness and headaches.  Hematological: Negative.   Psychiatric/Behavioral: Negative.  Negative for confusion.  All other systems reviewed and are negative.      Objective:   Physical Exam  Constitutional: He is oriented to person, place, and time. He appears well-developed and well-nourished. No distress.  HENT:  Head: Normocephalic.  Right Ear: External ear normal.  Left Ear: External ear normal.  Nose: Nose normal.  Mouth/Throat: Oropharynx is clear and moist.  Eyes: Pupils are equal, round, and reactive to light. Right eye exhibits no discharge. Left eye exhibits no discharge.  Neck: Normal range of motion. Neck supple. No thyromegaly present.  Cardiovascular: Normal rate, regular rhythm, normal heart sounds and intact distal pulses.   No murmur heard. Pulmonary/Chest: Effort normal and breath sounds normal. No respiratory distress. He has no wheezes.  Abdominal: Soft. Bowel sounds are normal. He exhibits no distension. There is no tenderness.    Musculoskeletal: Normal range of motion. He exhibits no edema or tenderness.  Neurological: He is alert and oriented to person, place, and time. He  has normal reflexes. No cranial nerve deficit.  Skin: Skin is warm and dry. No rash noted. No erythema.  Psychiatric: He has a normal mood and affect. His behavior is normal. Judgment and thought content normal.  Vitals reviewed.   BP 124/74 mmHg  Pulse 76  Temp(Src) 97.5 F (36.4 C) (Oral)  Ht _0  (1.753 m)  Wt 190 lb 12.8 oz (86.546 kg)  BMI 28.16 kg/m2       Assessment & Plan:  1. PVD (peripheral vascular disease) with claudication - CMP14+EGFR  2. Essential hypertension, benign - CMP14+EGFR  3. Angina pectoris associated with type 2 diabetes mellitus - CMP14+EGFR  4. Arthritis of both knees - CMP14+EGFR  5. Hyperlipidemia with target LDL less than 70 - CMP14+EGFR - Lipid panel  6. Diabetes type 2, uncontrolled - POCT glycosylated hemoglobin (Hb A1C) - POCT UA - Microalbumin - CMP14+EGFR  7. CAD, multiple vessel - CMP14+EGFR  8. Benign paroxysmal positional vertigo, unspecified laterality -Falls precaution discussed - CMP14+EGFR - meclizine (ANTIVERT) 50 MG tablet; Take 1 tablet (50 mg total) by mouth 3 (three) times daily as needed.  Dispense: 30 tablet; Refill: 0  9. Vitamin D deficiency - CMP14+EGFR - Vit D  25 hydroxy (rtn osteoporosis monitoring)   Continue all meds Labs pending Health Maintenance reviewed Diet and exercise encouraged RTO 6 months  Evelina Dun, FNP

## 2014-08-31 ENCOUNTER — Other Ambulatory Visit: Payer: Self-pay | Admitting: Family

## 2014-08-31 LAB — CMP14+EGFR
A/G RATIO: 1.6 (ref 1.1–2.5)
ALT: 11 IU/L (ref 0–44)
AST: 20 IU/L (ref 0–40)
Albumin: 4.2 g/dL (ref 3.5–4.8)
Alkaline Phosphatase: 61 IU/L (ref 39–117)
BUN/Creatinine Ratio: 16 (ref 10–22)
BUN: 18 mg/dL (ref 8–27)
Bilirubin Total: 0.8 mg/dL (ref 0.0–1.2)
CHLORIDE: 102 mmol/L (ref 97–108)
CO2: 26 mmol/L (ref 18–29)
Calcium: 9.4 mg/dL (ref 8.6–10.2)
Creatinine, Ser: 1.1 mg/dL (ref 0.76–1.27)
GFR calc non Af Amer: 64 mL/min/{1.73_m2} (ref >59)
GFR, EST AFRICAN AMERICAN: 74 mL/min/{1.73_m2} (ref >59)
GLOBULIN, TOTAL: 2.7 g/dL (ref 1.5–4.5)
Glucose: 97 mg/dL (ref 65–99)
POTASSIUM: 4.6 mmol/L (ref 3.5–5.2)
Sodium: 140 mmol/L (ref 134–144)
TOTAL PROTEIN: 6.9 g/dL (ref 6.0–8.5)

## 2014-08-31 LAB — LIPID PANEL
Chol/HDL Ratio: 2.1 ratio units (ref 0.0–5.0)
Cholesterol, Total: 126 mg/dL (ref 100–199)
HDL: 61 mg/dL (ref >39)
LDL CALC: 55 mg/dL (ref 0–99)
TRIGLYCERIDES: 48 mg/dL (ref 0–149)
VLDL CHOLESTEROL CAL: 10 mg/dL (ref 5–40)

## 2014-08-31 LAB — VITAMIN D 25 HYDROXY (VIT D DEFICIENCY, FRACTURES): Vit D, 25-Hydroxy: 37.8 ng/mL (ref 30.0–100.0)

## 2014-08-31 LAB — MICROALBUMIN, URINE: MICROALBUM., U, RANDOM: 6.1 ug/mL (ref 0.0–17.0)

## 2014-09-16 ENCOUNTER — Encounter (HOSPITAL_COMMUNITY): Payer: Self-pay | Admitting: Emergency Medicine

## 2014-09-16 ENCOUNTER — Emergency Department (INDEPENDENT_AMBULATORY_CARE_PROVIDER_SITE_OTHER)
Admission: EM | Admit: 2014-09-16 | Discharge: 2014-09-16 | Disposition: A | Discharge status: Home / Self Care | Payer: Self-pay | Source: Home / Self Care | Attending: Family Medicine | Admitting: Family Medicine

## 2014-09-16 DIAGNOSIS — Z041 Encounter for examination and observation following transport accident: Secondary | ICD-10-CM
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
## 2014-09-16 NOTE — Discharge Instructions (Signed)
Ice and advil if needed, regular activity, return as needed.
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
## 2014-09-22 ENCOUNTER — Other Ambulatory Visit: Payer: Self-pay | Admitting: Family

## 2015-02-10 ENCOUNTER — Ambulatory Visit: Payer: Medicare HMO | Admitting: Family

## 2015-02-14 ENCOUNTER — Ambulatory Visit (INDEPENDENT_AMBULATORY_CARE_PROVIDER_SITE_OTHER): Payer: Medicare HMO | Admitting: Family

## 2015-02-14 ENCOUNTER — Encounter: Payer: Self-pay | Admitting: Family

## 2015-02-14 VITALS — BP 120/71 | HR 63 | Temp 97.6°F | Ht 69.0 in | Wt 188.8 lb

## 2015-02-14 DIAGNOSIS — I739 Peripheral vascular disease, unspecified: Secondary | ICD-10-CM

## 2015-02-14 DIAGNOSIS — I2510 Atherosclerotic heart disease of native coronary artery without angina pectoris: Secondary | ICD-10-CM

## 2015-02-14 DIAGNOSIS — E559 Vitamin D deficiency, unspecified: Secondary | ICD-10-CM

## 2015-02-14 DIAGNOSIS — I10 Essential (primary) hypertension: Secondary | ICD-10-CM

## 2015-02-14 DIAGNOSIS — Z23 Encounter for immunization: Secondary | ICD-10-CM

## 2015-02-14 DIAGNOSIS — E785 Hyperlipidemia, unspecified: Secondary | ICD-10-CM

## 2015-02-14 DIAGNOSIS — M170 Bilateral primary osteoarthritis of knee: Secondary | ICD-10-CM

## 2015-02-14 DIAGNOSIS — M129 Arthropathy, unspecified: Secondary | ICD-10-CM

## 2015-02-14 DIAGNOSIS — R739 Hyperglycemia, unspecified: Secondary | ICD-10-CM

## 2015-02-14 LAB — POCT GLYCOSYLATED HEMOGLOBIN (HGB A1C): Hemoglobin A1C: 4.8

## 2015-02-14 NOTE — Progress Notes (Signed)
   Subjective:    Patient ID: Joseph Byrd, male    DOB: 09/10/1933, 81 y.o.   MRN: [id number]  Pt presents to the office for chronic follow up.  Hyperlipidemia This is a chronic problem. The current episode started more than 1 year ago. The problem is controlled. Recent lipid tests were reviewed and are normal. Exacerbating diseases include diabetes. Pertinent negatives include no leg pain or shortness of breath. Current antihyperlipidemic treatment includes statins. The current treatment provides moderate improvement of lipids. Risk factors for coronary artery disease include diabetes mellitus, dyslipidemia, family history, hypertension and male sex.  Hypertension This is a chronic problem. The current episode started more than 1 year ago. The problem has been resolved since onset. Pertinent negatives include no palpitations, peripheral edema or shortness of breath. Risk factors for coronary artery disease include diabetes mellitus, dyslipidemia, family history and male gender. Past treatments include angiotensin blockers, beta blockers and diuretics. The current treatment provides moderate improvement. There is no history of kidney disease, CAD/MI or heart failure.  Arthritis      Review of Systems  Constitutional: Negative.   HENT: Negative.   Respiratory: Negative.  Negative for shortness of breath.   Cardiovascular: Negative.  Negative for palpitations.  Gastrointestinal: Negative.   Endocrine: Negative.   Genitourinary: Negative.   Musculoskeletal: Positive for arthritis.  Neurological: Negative.   Hematological: Negative.   Psychiatric/Behavioral: Negative.   All other systems reviewed and are negative.      Objective:   Physical Exam  Constitutional: He is oriented to person, place, and time. He appears well-developed and well-nourished. No distress.  HENT:  Head: Normocephalic.  Right Ear: External ear normal.  Left Ear: External ear normal.  Nose: Nose normal.    Mouth/Throat: Oropharynx is clear and moist.  Eyes: Pupils are equal, round, and reactive to light. Right eye exhibits no discharge. Left eye exhibits no discharge.  Neck: Normal range of motion. Neck supple. No thyromegaly present.  Cardiovascular: Normal rate, regular rhythm, normal heart sounds and intact distal pulses.   No murmur heard. Pulmonary/Chest: Effort normal and breath sounds normal. No respiratory distress. He has no wheezes.  Abdominal: Soft. Bowel sounds are normal. He exhibits no distension. There is no tenderness.  Musculoskeletal: Normal range of motion. He exhibits no edema or tenderness.  Neurological: He is alert and oriented to person, place, and time. He has normal reflexes. No cranial nerve deficit.  Skin: Skin is warm and dry. No rash noted. No erythema.  Psychiatric: He has a normal mood and affect. His behavior is normal. Judgment and thought content normal.  Vitals reviewed.   BP 120/71 mmHg  Pulse 63  Temp(Src) 97.6 F (36.4 C) (Oral)  Ht _0  (1.753 m)  Wt 188 lb 12.8 oz (85.639 kg)  BMI 27.87 kg/m2       Assessment & Plan:  1. Vitamin D deficiency - CMP14+EGFR - Vit D  25 hydroxy (rtn osteoporosis monitoring)  2. Hyperlipidemia with target LDL less than 70 - CMP14+EGFR - Lipid panel  3. Arthritis of both knees - CMP14+EGFR  4. CAD, multiple vessel - CMP14+EGFR  5. Essential hypertension, benign  - CMP14+EGFR  6. PVD (peripheral vascular disease) with claudicatio - CMP14+EGFR  7. Hyperglycemia  - POCT glycosylated hemoglobin (Hb A1C) - CMP14+EGFR   Continue all meds Labs pending Health Maintenance reviewed- Prevnar given today Diet and exercise encouraged RTO 6 months  Evelina Dun, FNP

## 2015-02-14 NOTE — Patient Instructions (Signed)

## 2015-02-15 LAB — CMP14+EGFR
A/G RATIO: 1.5 (ref 1.1–2.5)
ALBUMIN: 4.2 g/dL (ref 3.5–4.8)
ALT: 8 IU/L (ref 0–44)
AST: 14 IU/L (ref 0–40)
Alkaline Phosphatase: 64 IU/L (ref 39–117)
BUN/Creatinine Ratio: 14 (ref 10–22)
BUN: 15 mg/dL (ref 8–27)
Bilirubin Total: 0.8 mg/dL (ref 0.0–1.2)
CO2: 24 mmol/L (ref 18–29)
CREATININE: 1.06 mg/dL (ref 0.76–1.27)
Calcium: 9.2 mg/dL (ref 8.6–10.2)
Chloride: 102 mmol/L (ref 97–108)
GFR calc non Af Amer: 67 mL/min/{1.73_m2} (ref >59)
GFR, EST AFRICAN AMERICAN: 77 mL/min/{1.73_m2} (ref >59)
Globulin, Total: 2.8 g/dL (ref 1.5–4.5)
Glucose: 103 mg/dL — ABNORMAL HIGH (ref 65–99)
POTASSIUM: 4.5 mmol/L (ref 3.5–5.2)
SODIUM: 142 mmol/L (ref 134–144)
TOTAL PROTEIN: 7 g/dL (ref 6.0–8.5)

## 2015-02-15 LAB — LIPID PANEL
Chol/HDL Ratio: 2.4 ratio units (ref 0.0–5.0)
Cholesterol, Total: 129 mg/dL (ref 100–199)
HDL: 54 mg/dL (ref >39)
LDL Calculated: 63 mg/dL (ref 0–99)
Triglycerides: 60 mg/dL (ref 0–149)
VLDL Cholesterol Cal: 12 mg/dL (ref 5–40)

## 2015-02-15 LAB — VITAMIN D 25 HYDROXY (VIT D DEFICIENCY, FRACTURES): VIT D 25 HYDROXY: 36.2 ng/mL (ref 30.0–100.0)

## 2015-03-02 ENCOUNTER — Other Ambulatory Visit: Payer: Self-pay | Admitting: Family

## 2015-03-04 ENCOUNTER — Other Ambulatory Visit: Payer: Self-pay | Admitting: Nurse Practitioner

## 2015-03-06 DIAGNOSIS — R69 Illness, unspecified: Secondary | ICD-10-CM | POA: Diagnosis not present

## 2015-03-12 ENCOUNTER — Other Ambulatory Visit: Payer: Self-pay | Admitting: Family

## 2015-03-23 ENCOUNTER — Other Ambulatory Visit: Payer: Self-pay | Admitting: Family

## 2015-04-07 ENCOUNTER — Ambulatory Visit (INDEPENDENT_AMBULATORY_CARE_PROVIDER_SITE_OTHER): Payer: Medicare HMO

## 2015-04-07 DIAGNOSIS — Z23 Encounter for immunization: Secondary | ICD-10-CM

## 2015-04-23 ENCOUNTER — Other Ambulatory Visit: Payer: Self-pay | Admitting: Family

## 2015-06-11 ENCOUNTER — Other Ambulatory Visit: Payer: Self-pay | Admitting: Family

## 2015-06-19 ENCOUNTER — Other Ambulatory Visit: Payer: Self-pay | Admitting: Nurse Practitioner

## 2015-06-30 DIAGNOSIS — R69 Illness, unspecified: Secondary | ICD-10-CM | POA: Diagnosis not present

## 2015-07-10 ENCOUNTER — Other Ambulatory Visit: Payer: Self-pay | Admitting: Family

## 2015-07-10 NOTE — Telephone Encounter (Signed)
Last seen 9/13./16  [facility] [address]

## 2015-07-12 DIAGNOSIS — H04123 Dry eye syndrome of bilateral lacrimal glands: Secondary | ICD-10-CM | POA: Diagnosis not present

## 2015-07-28 ENCOUNTER — Other Ambulatory Visit: Payer: Self-pay | Admitting: Nurse Practitioner

## 2015-08-15 ENCOUNTER — Encounter: Payer: Self-pay | Admitting: Family

## 2015-08-15 ENCOUNTER — Ambulatory Visit (INDEPENDENT_AMBULATORY_CARE_PROVIDER_SITE_OTHER): Payer: Medicare HMO | Admitting: Family

## 2015-08-15 VITALS — BP 116/68 | HR 57 | Temp 97.0°F | Ht 69.0 in | Wt 192.6 lb

## 2015-08-15 DIAGNOSIS — I739 Peripheral vascular disease, unspecified: Secondary | ICD-10-CM

## 2015-08-15 DIAGNOSIS — I2510 Atherosclerotic heart disease of native coronary artery without angina pectoris: Secondary | ICD-10-CM | POA: Diagnosis not present

## 2015-08-15 DIAGNOSIS — E559 Vitamin D deficiency, unspecified: Secondary | ICD-10-CM | POA: Diagnosis not present

## 2015-08-15 DIAGNOSIS — M129 Arthropathy, unspecified: Secondary | ICD-10-CM

## 2015-08-15 DIAGNOSIS — I10 Essential (primary) hypertension: Secondary | ICD-10-CM | POA: Diagnosis not present

## 2015-08-15 DIAGNOSIS — E785 Hyperlipidemia, unspecified: Secondary | ICD-10-CM | POA: Diagnosis not present

## 2015-08-15 DIAGNOSIS — M170 Bilateral primary osteoarthritis of knee: Secondary | ICD-10-CM

## 2015-08-15 LAB — CMP14+EGFR
ALBUMIN: 4 g/dL (ref 3.5–4.8)
ALK PHOS: 62 IU/L (ref 39–117)
ALT: 16 IU/L (ref 0–44)
AST: 21 IU/L (ref 0–40)
Albumin/Globulin Ratio: 1.4 (ref 1.2–2.2)
BUN / CREAT RATIO: 16 (ref 10–22)
BUN: 20 mg/dL (ref 8–27)
Bilirubin Total: 0.8 mg/dL (ref 0.0–1.2)
CO2: 27 mmol/L (ref 18–29)
CREATININE: 1.29 mg/dL — AB (ref 0.76–1.27)
Calcium: 9.2 mg/dL (ref 8.6–10.2)
Chloride: 101 mmol/L (ref 96–106)
GFR calc Af Amer: 61 mL/min/{1.73_m2} (ref >59)
GFR, EST NON AFRICAN AMERICAN: 52 mL/min/{1.73_m2} — AB (ref >59)
GLOBULIN, TOTAL: 2.9 g/dL (ref 1.5–4.5)
GLUCOSE: 109 mg/dL — AB (ref 65–99)
Potassium: 4.7 mmol/L (ref 3.5–5.2)
SODIUM: 140 mmol/L (ref 134–144)
TOTAL PROTEIN: 6.9 g/dL (ref 6.0–8.5)

## 2015-08-15 LAB — LIPID PANEL
CHOL/HDL RATIO: 2.3 ratio (ref 0.0–5.0)
CHOLESTEROL TOTAL: 122 mg/dL (ref 100–199)
HDL: 53 mg/dL (ref >39)
LDL CALC: 58 mg/dL (ref 0–99)
Triglycerides: 53 mg/dL (ref 0–149)
VLDL CHOLESTEROL CAL: 11 mg/dL (ref 5–40)

## 2015-08-15 NOTE — Progress Notes (Signed)
Subjective:    Patient ID: Joseph Byrd, male    DOB: Oct 06, 1933, 82 y.o.   MRN: [id number]  Pt presents to the office today for chronic follow up. PT is followed by his Cardiologists every 6 months. Hyperlipidemia This is a chronic problem. The current episode started more than 1 year ago. The problem is controlled. Recent lipid tests were reviewed and are normal. Exacerbating diseases include diabetes. Associated symptoms include shortness of breath ("at times if i walk"). Pertinent negatives include no leg pain. Current antihyperlipidemic treatment includes statins. The current treatment provides moderate improvement of lipids. Risk factors for coronary artery disease include diabetes mellitus, dyslipidemia, family history, hypertension and male sex.  Hypertension This is a chronic problem. The current episode started more than 1 year ago. The problem has been resolved since onset. The problem is controlled. Associated symptoms include shortness of breath ("at times if i walk"). Pertinent negatives include no headaches, palpitations or peripheral edema. Risk factors for coronary artery disease include diabetes mellitus, dyslipidemia, family history and male gender. Past treatments include angiotensin blockers, beta blockers and diuretics. The current treatment provides moderate improvement. There is no history of kidney disease, CAD/MI or heart failure.  Arthritis Presents for follow-up visit. He complains of pain and joint warmth. The symptoms have been stable. Affected locations include the right knee and left knee. His pain is at a severity of 6/10. Pertinent negatives include no dry mouth or pain while resting. His pertinent risk factors include overuse. Past treatments include rest and NSAIDs. The treatment provided mild relief.      Review of Systems  Constitutional: Negative.   HENT: Negative.   Respiratory: Positive for shortness of breath ("at times if i walk").     Cardiovascular: Negative.  Negative for palpitations.  Gastrointestinal: Negative.   Endocrine: Negative.   Genitourinary: Negative.   Musculoskeletal: Positive for arthritis.  Neurological: Negative.  Negative for headaches.  Hematological: Negative.   Psychiatric/Behavioral: Negative.   All other systems reviewed and are negative.      Objective:   Physical Exam  Constitutional: He is oriented to person, place, and time. He appears well-developed and well-nourished. No distress.  HENT:  Head: Normocephalic.  Right Ear: External ear normal.  Left Ear: External ear normal.  Nose: Nose normal.  Mouth/Throat: Oropharynx is clear and moist.  Eyes: Pupils are equal, round, and reactive to light. Right eye exhibits no discharge. Left eye exhibits no discharge.  Neck: Normal range of motion. Neck supple. No thyromegaly present.  Cardiovascular: Normal rate, regular rhythm, normal heart sounds and intact distal pulses.   No murmur heard. Pulmonary/Chest: Effort normal and breath sounds normal. No respiratory distress. He has no wheezes.  Bradycardia   Abdominal: Soft. Bowel sounds are normal. He exhibits no distension. There is no tenderness.  Musculoskeletal: Normal range of motion. He exhibits no edema or tenderness.  Neurological: He is alert and oriented to person, place, and time. He has normal reflexes. No cranial nerve deficit.  Skin: Skin is warm and dry. No rash noted. No erythema.  Psychiatric: He has a normal mood and affect. His behavior is normal. Judgment and thought content normal.  Vitals reviewed.   BP 104/49 mmHg  Pulse 57  Temp(Src) 97 F (36.1 C) (Oral)  Ht '5\' 9"'  (1.753 m)  Wt 192 lb 9.6 oz (87.363 kg)  BMI 28.43 kg/m2       Assessment & Plan:  1. PVD (peripheral vascular disease) with claudication ([place] - CMP14+EGFR  2. Essential hypertension, benign - CMP14+EGFR  3. CAD, multiple vessel - CMP14+EGFR  4. Arthritis of both knees -  CMP14+EGFR  5. Hyperlipidemia with target LDL less than 70 - CMP14+EGFR - Lipid panel  6. Vitamin D deficiency - CMP14+EGFR   Continue all meds Labs pending Health Maintenance reviewed Diet and exercise encouraged RTO 6 months  Evelina Dun, FNP

## 2015-08-15 NOTE — Patient Instructions (Signed)

## 2015-08-24 ENCOUNTER — Encounter: Payer: Self-pay | Admitting: Family Medicine

## 2015-08-24 ENCOUNTER — Ambulatory Visit (INDEPENDENT_AMBULATORY_CARE_PROVIDER_SITE_OTHER): Payer: Medicare HMO | Admitting: Family Medicine

## 2015-08-24 VITALS — BP 108/61 | HR 62 | Temp 97.2°F | Ht 69.0 in | Wt 192.8 lb

## 2015-08-24 DIAGNOSIS — R42 Dizziness and giddiness: Secondary | ICD-10-CM

## 2015-08-24 DIAGNOSIS — I10 Essential (primary) hypertension: Secondary | ICD-10-CM | POA: Diagnosis not present

## 2015-08-24 MED ORDER — VALSARTAN-HYDROCHLOROTHIAZIDE 160-12.5 MG PO TABS: 1.0000 | ORAL_TABLET | Freq: Every day | ORAL | Status: DC

## 2015-08-24 NOTE — Progress Notes (Signed)
BP 108/61 mmHg  Pulse 62  Temp(Src) 97.2 F (36.2 C) (Oral)  Ht _0  (1.753 m)  Wt 192 lb 12.8 oz (87.454 kg)  BMI 28.46 kg/m2   Subjective:    Patient ID: Joseph Byrd, male    DOB: September 08, 1933, 82 y.o.   MRN: [id number]  HPI: Joseph Byrd is a 82 y.o. male presenting on 08/24/2015 for Dizziness; Emesis; Hiccups; Headache; and Neck Pain   HPI Lightheadedness and dizziness Patient has been having intermittent dizziness and lightheadedness that has been occurring since yesterday morning. He says he also has had some headaches associated with it on the frontal region and occipital region of his head. Those are not associated with each time has had dizziness have been there intermittently off and on. He also has been having headaches and neck pain in the occipital region since yesterday as well. He denies any focal numbness or weakness. He has not had any syncopal episodes but does feel recently. He denies any blurred vision. He also complains of intermittent hiccups since yesterday. He does not have a headache currently or hiccups currently.  Relevant past medical, surgical, family and social history reviewed and updated as indicated. Interim medical history since our last visit reviewed. Allergies and medications reviewed and updated.  Review of Systems  Constitutional: Negative for fever and chills.  HENT: Negative for ear discharge and ear pain.   Eyes: Negative for discharge and visual disturbance.  Respiratory: Negative for chest tightness, shortness of breath and wheezing.   Cardiovascular: Negative for chest pain and leg swelling.  Gastrointestinal: Negative for abdominal pain, diarrhea and constipation.  Genitourinary: Negative for difficulty urinating.  Musculoskeletal: Negative for back pain and gait problem.  Skin: Negative for rash.  Neurological: Positive for dizziness and light-headedness. Negative for syncope, weakness and headaches.  All other systems  reviewed and are negative.   Per HPI unless specifically indicated above     Medication List       This list is accurate as of: 08/24/15  3:31 PM.  Always use your most recent med list.               aspirin 81 MG tablet  Take 81 mg by mouth daily.     atorvastatin 20 MG tablet  Commonly known as:  LIPITOR  Take 1 tablet (20 mg total) by mouth daily.     carvedilol 3.125 MG tablet  Commonly known as:  COREG  Take 1 tablet (3.125 mg total) by mouth 2 (two) times daily with a meal.     cilostazol 100 MG tablet  Commonly known as:  PLETAL  TAKE 1 TABLET (100 MG TOTAL) BY MOUTH 2 (TWO) TIMES DAILY.     doxazosin 8 MG tablet  Commonly known as:  CARDURA  TAKE 1 TABLET DAILY AT BEDTIME     glucose blood test strip  Commonly known as:  ONETOUCH VERIO  Test BG qd DX R73.9     isosorbide mononitrate 60 MG 24 hr tablet  Commonly known as:  IMDUR  TAKE 1 TABLET BY MOUTH DAILY     meclizine 25 MG tablet  Commonly known as:  ANTIVERT  TAKE 2 TABLETS BY MOUTH THREE TIMES DAILY AS NEEDED     multivitamin with minerals Tabs tablet  Take 1 tablet by mouth daily.     NITROSTAT 0.4 MG SL tablet  Generic drug:  nitroGLYCERIN  Place 0.4 mg under the tongue every 5 (five) minutes as needed for chest  pain. Reported on 7/90/3833     ONETOUCH DELICA LANCETS 38V Misc  Check BG qd. DX R73.9     valsartan-hydrochlorothiazide 160-12.5 MG tablet  Commonly known as:  DIOVAN-HCT  Take 1 tablet by mouth daily.           Objective:    BP 108/61 mmHg  Pulse 62  Temp(Src) 97.2 F (36.2 C) (Oral)  Ht _0  (1.753 m)  Wt 192 lb 12.8 oz (87.454 kg)  BMI 28.46 kg/m2  Wt Readings from Last 3 Encounters:  08/24/15 192 lb 12.8 oz (87.454 kg)  08/15/15 192 lb 9.6 oz (87.363 kg)  02/14/15 188 lb 12.8 oz (85.639 kg)    Physical Exam  Constitutional: He is oriented to person, place, and time. He appears well-developed and well-nourished. No distress.  Eyes: Conjunctivae and EOM are  normal. Pupils are equal, round, and reactive to light. Right eye exhibits no discharge. No scleral icterus.  Neck: Neck supple. No thyromegaly present.  Cardiovascular: Normal rate, regular rhythm, normal heart sounds and intact distal pulses.   No murmur heard. Pulmonary/Chest: Effort normal and breath sounds normal. No respiratory distress. He has no wheezes.  Musculoskeletal: Normal range of motion. He exhibits no edema.  Lymphadenopathy:    He has no cervical adenopathy.  Neurological: He is alert and oriented to person, place, and time. He has normal strength and normal reflexes. No cranial nerve deficit or sensory deficit. Coordination normal.  Skin: Skin is warm and dry. No rash noted. He is not diaphoretic.  Psychiatric: He has a normal mood and affect. His behavior is normal.  Nursing note and vitals reviewed.   Results for orders placed or performed in visit on 08/15/15  CMP14+EGFR  Result Value Ref Range   Glucose 109 (H) 65 - 99 mg/dL   BUN 20 8 - 27 mg/dL   Creatinine, Ser 1.29 (H) 0.76 - 1.27 mg/dL   GFR calc non Af Amer 52 (L) >59 mL/min/1.73   GFR calc Af Amer 61 >59 mL/min/1.73   BUN/Creatinine Ratio 16 10 - 22   Sodium 140 134 - 144 mmol/L   Potassium 4.7 3.5 - 5.2 mmol/L   Chloride 101 96 - 106 mmol/L   CO2 27 18 - 29 mmol/L   Calcium 9.2 8.6 - 10.2 mg/dL   Total Protein 6.9 6.0 - 8.5 g/dL   Albumin 4.0 3.5 - 4.8 g/dL   Globulin, Total 2.9 1.5 - 4.5 g/dL   Albumin/Globulin Ratio 1.4 1.2 - 2.2   Bilirubin Total 0.8 0.0 - 1.2 mg/dL   Alkaline Phosphatase 62 39 - 117 IU/L   AST 21 0 - 40 IU/L   ALT 16 0 - 44 IU/L  Lipid panel  Result Value Ref Range   Cholesterol, Total 122 100 - 199 mg/dL   Triglycerides 53 0 - 149 mg/dL   HDL 53 >39 mg/dL   VLDL Cholesterol Cal 11 5 - 40 mg/dL   LDL Calculated 58 0 - 99 mg/dL   Chol/HDL Ratio 2.3 0.0 - 5.0 ratio units      Assessment & Plan:   Problem List Items Addressed This Visit      Cardiovascular and  Mediastinum   Essential hypertension, benign   Relevant Medications   valsartan-hydrochlorothiazide (DIOVAN-HCT) 160-12.5 MG tablet    Other Visit Diagnoses    Lightheadedness    -  Primary    Relevant Medications    valsartan-hydrochlorothiazide (DIOVAN-HCT) 160-12.5 MG tablet  Follow up plan: Return in about 2 weeks (around 09/07/2015), or if symptoms worsen or fail to improve, for Recheck blood pressure and lightheadedness..  Counseling provided for all of the vaccine components No orders of the defined types were placed in this encounter.    Caryl Pina, MD [facility] 08/24/2015, 3:31 PM

## 2015-08-25 ENCOUNTER — Other Ambulatory Visit: Payer: Self-pay | Admitting: Family

## 2015-09-07 ENCOUNTER — Ambulatory Visit (INDEPENDENT_AMBULATORY_CARE_PROVIDER_SITE_OTHER): Payer: Medicare HMO | Admitting: Family Medicine

## 2015-09-07 ENCOUNTER — Encounter: Payer: Self-pay | Admitting: Family Medicine

## 2015-09-07 ENCOUNTER — Encounter (INDEPENDENT_AMBULATORY_CARE_PROVIDER_SITE_OTHER): Payer: Self-pay

## 2015-09-07 VITALS — BP 134/78 | HR 53 | Temp 97.1°F | Ht 69.0 in | Wt 194.8 lb

## 2015-09-07 DIAGNOSIS — I10 Essential (primary) hypertension: Secondary | ICD-10-CM | POA: Diagnosis not present

## 2015-09-07 NOTE — Progress Notes (Signed)
BP 134/78 mmHg  Pulse 53  Temp(Src) 97.1 F (36.2 C) (Oral)  Ht '5\' 9"'  (1.753 m)  Wt 194 lb 12.8 oz (88.361 kg)  BMI 28.75 kg/m2   Subjective:    Patient ID: Joseph Byrd, male    DOB: Aug 21, 1933, 82 y.o.   MRN: [id number]  HPI: Joseph Byrd is a 82 y.o. male presenting on 09/07/2015 for Hypertension   HPI Hypertension Patient's blood pressure today is 1 3478. He is not having any dizziness further like he was having previously. He is doing well on the half medication that we lowered it down too. Patient denies headaches, blurred vision, chest pains, shortness of breath, or weakness. Denies any side effects from medication and is content with current medication.   Relevant past medical, surgical, family and social history reviewed and updated as indicated. Interim medical history since our last visit reviewed. Allergies and medications reviewed and updated.  Review of Systems  Constitutional: Negative for fever.  HENT: Negative for ear discharge and ear pain.   Eyes: Negative for discharge and visual disturbance.  Respiratory: Negative for shortness of breath and wheezing.   Cardiovascular: Negative for chest pain and leg swelling.  Gastrointestinal: Negative for abdominal pain, diarrhea and constipation.  Genitourinary: Negative for difficulty urinating.  Musculoskeletal: Negative for back pain and gait problem.  Skin: Negative for rash.  Neurological: Negative for syncope, light-headedness and headaches.  All other systems reviewed and are negative.   Per HPI unless specifically indicated above     Medication List       This list is accurate as of: 09/07/15  3:28 PM.  Always use your most recent med list.               aspirin 81 MG tablet  Take 81 mg by mouth daily.     atorvastatin 20 MG tablet  Commonly known as:  LIPITOR  Take 1 tablet (20 mg total) by mouth daily.     carvedilol 3.125 MG tablet  Commonly known as:  COREG  Take 1 tablet (3.125  mg total) by mouth 2 (two) times daily with a meal.     cilostazol 100 MG tablet  Commonly known as:  PLETAL  TAKE 1 TABLET (100 MG TOTAL) BY MOUTH 2 (TWO) TIMES DAILY.     doxazosin 8 MG tablet  Commonly known as:  CARDURA  TAKE 1 TABLET DAILY AT BEDTIME     glucose blood test strip  Commonly known as:  ONETOUCH VERIO  Test BG qd DX R73.9     isosorbide mononitrate 60 MG 24 hr tablet  Commonly known as:  IMDUR  TAKE 1 TABLET BY MOUTH DAILY     meclizine 25 MG tablet  Commonly known as:  ANTIVERT  TAKE 2 TABLETS BY MOUTH THREE TIMES DAILY AS NEEDED     multivitamin with minerals Tabs tablet  Take 1 tablet by mouth daily.     NITROSTAT 0.4 MG SL tablet  Generic drug:  nitroGLYCERIN  Place 0.4 mg under the tongue every 5 (five) minutes as needed for chest pain. Reported on 09/08/6806     [facility] DELICA LANCETS 81J Misc  Check BG qd. DX R73.9     valsartan-hydrochlorothiazide 160-12.5 MG tablet  Commonly known as:  DIOVAN-HCT  Take 1 tablet by mouth daily.           Objective:    BP 134/78 mmHg  Pulse 53  Temp(Src) 97.1 F (36.2 C) (Oral)  Ht 5'  9" (1.753 m)  Wt 194 lb 12.8 oz (88.361 kg)  BMI 28.75 kg/m2  Wt Readings from Last 3 Encounters:  09/07/15 194 lb 12.8 oz (88.361 kg)  08/24/15 192 lb 12.8 oz (87.454 kg)  08/15/15 192 lb 9.6 oz (87.363 kg)    Physical Exam  Constitutional: He is oriented to person, place, and time. He appears well-developed and well-nourished. No distress.  Eyes: Conjunctivae and EOM are normal. Pupils are equal, round, and reactive to light. Right eye exhibits no discharge. No scleral icterus.  Neck: Neck supple. No thyromegaly present.  Cardiovascular: Normal rate, regular rhythm, normal heart sounds and intact distal pulses.   No murmur heard. Pulmonary/Chest: Effort normal and breath sounds normal. No respiratory distress. He has no wheezes.  Musculoskeletal: Normal range of motion. He exhibits no edema.  Lymphadenopathy:     He has no cervical adenopathy.  Neurological: He is alert and oriented to person, place, and time. Coordination normal.  Skin: Skin is warm and dry. No rash noted. He is not diaphoretic.  Psychiatric: He has a normal mood and affect. His behavior is normal.  Vitals reviewed.   Results for orders placed or performed in visit on 08/15/15  CMP14+EGFR  Result Value Ref Range   Glucose 109 (H) 65 - 99 mg/dL   BUN 20 8 - 27 mg/dL   Creatinine, Ser 1.29 (H) 0.76 - 1.27 mg/dL   GFR calc non Af Amer 52 (L) >59 mL/min/1.73   GFR calc Af Amer 61 >59 mL/min/1.73   BUN/Creatinine Ratio 16 10 - 22   Sodium 140 134 - 144 mmol/L   Potassium 4.7 3.5 - 5.2 mmol/L   Chloride 101 96 - 106 mmol/L   CO2 27 18 - 29 mmol/L   Calcium 9.2 8.6 - 10.2 mg/dL   Total Protein 6.9 6.0 - 8.5 g/dL   Albumin 4.0 3.5 - 4.8 g/dL   Globulin, Total 2.9 1.5 - 4.5 g/dL   Albumin/Globulin Ratio 1.4 1.2 - 2.2   Bilirubin Total 0.8 0.0 - 1.2 mg/dL   Alkaline Phosphatase 62 39 - 117 IU/L   AST 21 0 - 40 IU/L   ALT 16 0 - 44 IU/L  Lipid panel  Result Value Ref Range   Cholesterol, Total 122 100 - 199 mg/dL   Triglycerides 53 0 - 149 mg/dL   HDL 53 >39 mg/dL   VLDL Cholesterol Cal 11 5 - 40 mg/dL   LDL Calculated 58 0 - 99 mg/dL   Chol/HDL Ratio 2.3 0.0 - 5.0 ratio units      Assessment & Plan:   Problem List Items Addressed This Visit      Cardiovascular and Mediastinum   Essential hypertension, benign - Primary    His dizziness is gone and his blood pressure is doing well today. We will continue to monitor and see back in 3 months          Follow up plan: Return in about 3 months (around 12/07/2015), or if symptoms worsen or fail to improve, for Hypertension recheck.  Counseling provided for all of the vaccine components No orders of the defined types were placed in this encounter.    Caryl Pina, MD [facility] 09/07/2015, 3:28 PM

## 2015-09-07 NOTE — Assessment & Plan Note (Signed)
His dizziness is gone and his blood pressure is doing well today. We will continue to monitor and see back in 3 months

## 2015-09-15 ENCOUNTER — Other Ambulatory Visit: Payer: Self-pay | Admitting: Family

## 2015-09-16 ENCOUNTER — Other Ambulatory Visit: Payer: Self-pay | Admitting: Family

## 2015-10-07 DIAGNOSIS — R69 Illness, unspecified: Secondary | ICD-10-CM | POA: Diagnosis not present

## 2015-10-09 ENCOUNTER — Ambulatory Visit: Payer: Medicare HMO | Admitting: Family Medicine

## 2015-10-23 ENCOUNTER — Other Ambulatory Visit: Payer: Self-pay | Admitting: Family

## 2015-11-18 ENCOUNTER — Other Ambulatory Visit: Payer: Self-pay | Admitting: Family Medicine

## 2015-11-19 ENCOUNTER — Other Ambulatory Visit: Payer: Self-pay | Admitting: Family

## 2015-11-24 DIAGNOSIS — I10 Essential (primary) hypertension: Secondary | ICD-10-CM | POA: Diagnosis not present

## 2015-11-24 DIAGNOSIS — Z5181 Encounter for therapeutic drug level monitoring: Secondary | ICD-10-CM | POA: Diagnosis not present

## 2015-11-24 DIAGNOSIS — E782 Mixed hyperlipidemia: Secondary | ICD-10-CM | POA: Diagnosis not present

## 2015-11-24 DIAGNOSIS — I25119 Atherosclerotic heart disease of native coronary artery with unspecified angina pectoris: Secondary | ICD-10-CM | POA: Diagnosis not present

## 2015-11-24 DIAGNOSIS — I70213 Atherosclerosis of native arteries of extremities with intermittent claudication, bilateral legs: Secondary | ICD-10-CM | POA: Diagnosis not present

## 2015-11-28 DIAGNOSIS — H40013 Open angle with borderline findings, low risk, bilateral: Secondary | ICD-10-CM | POA: Diagnosis not present

## 2015-11-28 DIAGNOSIS — H5203 Hypermetropia, bilateral: Secondary | ICD-10-CM | POA: Diagnosis not present

## 2015-11-28 DIAGNOSIS — H04123 Dry eye syndrome of bilateral lacrimal glands: Secondary | ICD-10-CM | POA: Diagnosis not present

## 2015-11-28 DIAGNOSIS — H524 Presbyopia: Secondary | ICD-10-CM | POA: Diagnosis not present

## 2015-11-28 DIAGNOSIS — H52223 Regular astigmatism, bilateral: Secondary | ICD-10-CM | POA: Diagnosis not present

## 2015-12-11 ENCOUNTER — Ambulatory Visit (INDEPENDENT_AMBULATORY_CARE_PROVIDER_SITE_OTHER): Payer: Medicare HMO | Admitting: Family Medicine

## 2015-12-11 ENCOUNTER — Encounter: Payer: Self-pay | Admitting: Family Medicine

## 2015-12-11 VITALS — BP 135/82 | HR 66 | Temp 97.3°F | Ht 69.0 in | Wt 190.4 lb

## 2015-12-11 DIAGNOSIS — I10 Essential (primary) hypertension: Secondary | ICD-10-CM

## 2015-12-11 DIAGNOSIS — N179 Acute kidney failure, unspecified: Secondary | ICD-10-CM | POA: Diagnosis not present

## 2015-12-11 MED ORDER — VALSARTAN-HYDROCHLOROTHIAZIDE 160-12.5 MG PO TABS: 1.0000 | ORAL_TABLET | Freq: Every day | ORAL | Status: DC

## 2015-12-11 NOTE — Progress Notes (Signed)
BP 135/82 mmHg  Pulse 66  Temp(Src) 97.3 F (36.3 C) (Oral)  Ht '5\' 9"'  (1.753 m)  Wt 190 lb 6.4 oz (86.365 kg)  BMI 28.10 kg/m2   Subjective:    Patient ID: Joseph Byrd, male    DOB: 1933-12-03, 82 y.o.   MRN: [id number]  HPI: Joseph Byrd is a 82 y.o. male presenting on 12/11/2015 for Hypertension   HPI Hypertension recheck Patient is coming today for hypertension recheck. His blood pressure today is 135/82. He is currently taking Coreg and valsartan-hydrochlorothiazide. Patient denies headaches, blurred vision, chest pains, shortness of breath, or weakness. Denies any side effects from medication and is content with current medication. At his last visit his creatinine had been slightly elevated at 1.29 from baseline of 1.1. We will recheck this today.  Relevant past medical, surgical, family and social history reviewed and updated as indicated. Interim medical history since our last visit reviewed. Allergies and medications reviewed and updated.  Review of Systems  Constitutional: Negative for fever and chills.  HENT: Negative for ear discharge and ear pain.   Eyes: Negative for discharge and visual disturbance.  Respiratory: Negative for shortness of breath and wheezing.   Cardiovascular: Negative for chest pain and leg swelling.  Gastrointestinal: Negative for abdominal pain, diarrhea and constipation.  Genitourinary: Negative for difficulty urinating.  Musculoskeletal: Negative for back pain and gait problem.  Skin: Negative for rash.  Neurological: Negative for dizziness, syncope, light-headedness and headaches.  All other systems reviewed and are negative.   Per HPI unless specifically indicated above     Medication List       This list is accurate as of: 12/11/15  8:30 AM.  Always use your most recent med list.               aspirin 81 MG tablet  Take 81 mg by mouth daily.     atorvastatin 20 MG tablet  Commonly known as:  LIPITOR  Take 1  tablet (20 mg total) by mouth daily.     carvedilol 3.125 MG tablet  Commonly known as:  COREG  Take 1 tablet (3.125 mg total) by mouth 2 (two) times daily with a meal.     carvedilol 6.25 MG tablet  Commonly known as:  COREG  TAKE 1 TAB TWICE DAILY WITH A MEAL     cilostazol 100 MG tablet  Commonly known as:  PLETAL  TAKE 1 TABLET (100 MG TOTAL) BY MOUTH 2 (TWO) TIMES DAILY.     doxazosin 8 MG tablet  Commonly known as:  CARDURA  TAKE 1 TABLET DAILY AT BEDTIME     glucose blood test strip  Commonly known as:  ONETOUCH VERIO  Test BG qd DX R73.9     isosorbide mononitrate 60 MG 24 hr tablet  Commonly known as:  IMDUR  TAKE 1 TABLET BY MOUTH DAILY     meclizine 25 MG tablet  Commonly known as:  ANTIVERT  TAKE 2 TABLETS BY MOUTH THREE TIMES DAILY AS NEEDED     multivitamin with minerals Tabs tablet  Take 1 tablet by mouth daily.     NITROSTAT 0.4 MG SL tablet  Generic drug:  nitroGLYCERIN  Place 0.4 mg under the tongue every 5 (five) minutes as needed for chest pain. Reported on 6/76/1950     [facility] DELICA LANCETS 93O Misc  Check BG qd. DX R73.9     valsartan-hydrochlorothiazide 160-12.5 MG tablet  Commonly known as:  DIOVAN-HCT  Take 1  tablet by mouth daily.           Objective:    BP 135/82 mmHg  Pulse 66  Temp(Src) 97.3 F (36.3 C) (Oral)  Ht '5\' 9"'  (1.753 m)  Wt 190 lb 6.4 oz (86.365 kg)  BMI 28.10 kg/m2  Wt Readings from Last 3 Encounters:  12/11/15 190 lb 6.4 oz (86.365 kg)  09/07/15 194 lb 12.8 oz (88.361 kg)  08/24/15 192 lb 12.8 oz (87.454 kg)    Physical Exam  Constitutional: He is oriented to person, place, and time. He appears well-developed and well-nourished. No distress.  Eyes: Conjunctivae and EOM are normal. Pupils are equal, round, and reactive to light. Right eye exhibits no discharge. No scleral icterus.  Neck: Neck supple. No thyromegaly present.  Cardiovascular: Normal rate, regular rhythm, normal heart sounds and intact distal  pulses.   No murmur heard. Pulmonary/Chest: Effort normal and breath sounds normal. No respiratory distress. He has no wheezes.  Musculoskeletal: Normal range of motion. He exhibits no edema.  Lymphadenopathy:    He has no cervical adenopathy.  Neurological: He is alert and oriented to person, place, and time. Coordination normal.  Skin: Skin is warm and dry. No rash noted. He is not diaphoretic.  Psychiatric: He has a normal mood and affect. His behavior is normal.  Nursing note and vitals reviewed.     Assessment & Plan:       Problem List Items Addressed This Visit      Cardiovascular and Mediastinum   Essential hypertension, benign - Primary   Relevant Medications   valsartan-hydrochlorothiazide (DIOVAN-HCT) 160-12.5 MG tablet    Other Visit Diagnoses    AKI (acute kidney injury) ([place])        Kidney function was slightly elevated on last labs, we'll recheck today    Relevant Orders    BMP8+EGFR        Follow up plan: Return in about 3 months (around 03/12/2016), or if symptoms worsen or fail to improve, for Hypertension recheck.  Counseling provided for all of the vaccine components Orders Placed This Encounter  Procedures  . BMP8+EGFR    Caryl Pina, MD [facility] 12/11/2015, 8:30 AM

## 2015-12-12 LAB — BMP8+EGFR
BUN/Creatinine Ratio: 18 (ref 10–24)
BUN: 18 mg/dL (ref 8–27)
CALCIUM: 9.1 mg/dL (ref 8.6–10.2)
CHLORIDE: 101 mmol/L (ref 96–106)
CO2: 30 mmol/L — ABNORMAL HIGH (ref 18–29)
CREATININE: 1.01 mg/dL (ref 0.76–1.27)
GFR calc non Af Amer: 70 mL/min/{1.73_m2} (ref >59)
GFR, EST AFRICAN AMERICAN: 81 mL/min/{1.73_m2} (ref >59)
GLUCOSE: 95 mg/dL (ref 65–99)
Potassium: 4.4 mmol/L (ref 3.5–5.2)
Sodium: 141 mmol/L (ref 134–144)

## 2015-12-22 ENCOUNTER — Encounter: Payer: Self-pay | Admitting: Family Medicine

## 2016-01-03 DIAGNOSIS — R69 Illness, unspecified: Secondary | ICD-10-CM | POA: Diagnosis not present

## 2016-01-20 ENCOUNTER — Other Ambulatory Visit: Payer: Self-pay | Admitting: Family Medicine

## 2016-02-15 ENCOUNTER — Other Ambulatory Visit: Payer: Self-pay | Admitting: Family Medicine

## 2016-03-13 ENCOUNTER — Encounter: Payer: Self-pay | Admitting: Family Medicine

## 2016-03-13 ENCOUNTER — Ambulatory Visit (INDEPENDENT_AMBULATORY_CARE_PROVIDER_SITE_OTHER): Payer: Medicare HMO | Admitting: Family Medicine

## 2016-03-13 VITALS — BP 117/73 | HR 65 | Temp 97.0°F | Ht 69.0 in | Wt 188.1 lb

## 2016-03-13 DIAGNOSIS — I2510 Atherosclerotic heart disease of native coronary artery without angina pectoris: Secondary | ICD-10-CM | POA: Diagnosis not present

## 2016-03-13 DIAGNOSIS — I10 Essential (primary) hypertension: Secondary | ICD-10-CM

## 2016-03-13 DIAGNOSIS — E785 Hyperlipidemia, unspecified: Secondary | ICD-10-CM | POA: Diagnosis not present

## 2016-03-13 DIAGNOSIS — M170 Bilateral primary osteoarthritis of knee: Secondary | ICD-10-CM | POA: Diagnosis not present

## 2016-03-13 MED ORDER — ATORVASTATIN CALCIUM 20 MG PO TABS: 20.0000 mg | ORAL_TABLET | Freq: Every day | ORAL | 1 refills | Status: DC

## 2016-03-13 MED ORDER — METHYLPREDNISOLONE ACETATE 80 MG/ML IJ SUSP
80.0000 mg | Freq: Once | INTRAMUSCULAR | Status: AC
  Administered 2016-03-13: 80 mg via INTRAMUSCULAR

## 2016-03-13 NOTE — Progress Notes (Signed)
BP 117/73   Pulse 65   Temp 97 F (36.1 C) (Oral)   Ht '5\' 9"'  (1.753 m)   Wt 188 lb 2 oz (85.3 kg)   BMI 27.78 kg/m    Subjective:    Patient ID: Joseph Byrd, male    DOB: [birth year], 82 y.o.   MRN: [id number]  HPI: Joseph Byrd is a 82 y.o. male presenting on 03/13/2016 for Hyperlipidemia (3 month followup; patient is fasting); Hypertension; and Right knee pain   HPI  Hyperlipidemia CAD and hypertension Patient is coming in for recheck and medication refill. He has been diagnosed with hypertension and has a history of CAD and it is hyperlipidemia with a target goal of getting his LDL down really well because of his history of CAD. Patient is currently on Lipitor 20 mg and oxytocin and Imdur and Diovan HCT and Coreg and Pletal for antiplatelet and an aspirin 81 mg for antiplatelet. Patient denies headaches, blurred vision, chest pains, shortness of breath, or weakness. Denies any side effects from medication and is content with current medication.   Right knee pain Patient has known arthritis in both his knees that currently it nightly this bothers him. Patient has had injections previously once in his right knee and once in his left knee and he feels like he got almost a year of relief out of the last injection. He would like to get an injection again today. The knee pain is low-grade and 4 out of 10 mostly hurts him in the anterior aspect of the right knee but also slightly on the medial side as well. He wakes up stiff in the morning and then it hurts with prolonged use throughout the day and when he is on his feet a lot.  Relevant past medical, surgical, family and social history reviewed and updated as indicated. Interim medical history since our last visit reviewed. Allergies and medications reviewed and updated.  Review of Systems  Constitutional: Negative for chills and fever.  Eyes: Negative for discharge.  Respiratory: Negative for shortness of breath and wheezing.    Cardiovascular: Negative for chest pain and leg swelling.  Musculoskeletal: Positive for arthralgias and joint swelling. Negative for back pain and gait problem.  Skin: Negative for color change and rash.  Neurological: Negative for dizziness, speech difficulty, weakness, light-headedness and headaches.  All other systems reviewed and are negative.   Per HPI unless specifically indicated above     Medication List       Accurate as of 03/13/16  9:03 AM. Always use your most recent med list.          aspirin 81 MG tablet Take 81 mg by mouth daily.   atorvastatin 20 MG tablet Commonly known as:  LIPITOR Take 1 tablet (20 mg total) by mouth daily.   carvedilol 3.125 MG tablet Commonly known as:  COREG Take 1 tablet (3.125 mg total) by mouth 2 (two) times daily with a meal.   carvedilol 6.25 MG tablet Commonly known as:  COREG TAKE 1 TAB TWICE DAILY WITH A MEAL   cilostazol 100 MG tablet Commonly known as:  PLETAL TAKE 1 TABLET (100 MG TOTAL) BY MOUTH 2 (TWO) TIMES DAILY.   doxazosin 8 MG tablet Commonly known as:  CARDURA TAKE 1 TABLET DAILY AT BEDTIME   glucose blood test strip Commonly known as:  ONETOUCH VERIO Test BG qd DX R73.9   isosorbide mononitrate 60 MG 24 hr tablet Commonly known as:  IMDUR TAKE 1 TABLET  BY MOUTH DAILY   meclizine 25 MG tablet Commonly known as:  ANTIVERT TAKE 2 TABLETS BY MOUTH THREE TIMES DAILY AS NEEDED   multivitamin with minerals Tabs tablet Take 1 tablet by mouth daily.   NITROSTAT 0.4 MG SL tablet Generic drug:  nitroGLYCERIN Place 0.4 mg under the tongue every 5 (five) minutes as needed for chest pain. Reported on 0/63/0160   [facility] DELICA LANCETS 10X Misc Check BG qd. DX R73.9   valsartan-hydrochlorothiazide 160-12.5 MG tablet Commonly known as:  DIOVAN-HCT Take 1 tablet by mouth daily.          Objective:    BP 117/73   Pulse 65   Temp 97 F (36.1 C) (Oral)   Ht '5\' 9"'  (1.753 m)   Wt 188 lb 2 oz (85.3  kg)   BMI 27.78 kg/m   Wt Readings from Last 3 Encounters:  03/13/16 188 lb 2 oz (85.3 kg)  12/11/15 190 lb 6.4 oz (86.4 kg)  09/07/15 194 lb 12.8 oz (88.4 kg)    Physical Exam  Constitutional: He is oriented to person, place, and time. He appears well-developed and well-nourished. No distress.  Eyes: Conjunctivae are normal. Right eye exhibits no discharge. No scleral icterus.  Cardiovascular: Normal rate, regular rhythm, normal heart sounds and intact distal pulses.   No murmur heard. Pulmonary/Chest: Effort normal and breath sounds normal. No respiratory distress. He has no wheezes.  Musculoskeletal: Normal range of motion. He exhibits no edema.       Right knee: He exhibits normal range of motion, no swelling, no erythema, normal alignment, no LCL laxity, no bony tenderness, normal meniscus and no MCL laxity. Tenderness found. Medial joint line tenderness noted.  Neurological: He is alert and oriented to person, place, and time. Coordination normal.  Skin: Skin is warm and dry. No rash noted. He is not diaphoretic.  Psychiatric: He has a normal mood and affect. His behavior is normal.  Nursing note and vitals reviewed.  Knee injection: Risk factors of bleeding and infection discussed with patient and patient is agreeable towards injection. Patient prepped with Betadine. Lateral approach towards injection used. Injected 80 mg of Depo-Medrol and 1 mL of 2% lidocaine. Patient tolerated procedure well and no side effects from noted. Minimal to no bleeding. Simple bandage applied after.   Assessment & Plan:   Problem List Items Addressed This Visit      Cardiovascular and Mediastinum   Essential hypertension, benign - Primary   Relevant Medications   atorvastatin (LIPITOR) 20 MG tablet   Other Relevant Orders   CMP14+EGFR   CBC with Differential/Platelet   CAD, multiple vessel   Relevant Medications   atorvastatin (LIPITOR) 20 MG tablet   Other Relevant Orders   CMP14+EGFR   CBC  with Differential/Platelet     Musculoskeletal and Integument   Arthritis of both knees   Relevant Medications   methylPREDNISolone acetate (DEPO-MEDROL) injection 80 mg (Start on 03/13/2016  9:15 AM)     Other   Hyperlipidemia with target LDL less than 70   Relevant Medications   atorvastatin (LIPITOR) 20 MG tablet   Other Relevant Orders   Lipid panel    Other Visit Diagnoses   None.      Follow up plan: Return in about 3 months (around 06/13/2016), or if symptoms worsen or fail to improve, for Recheck hypertension.  Counseling provided for all of the vaccine components Orders Placed This Encounter  Procedures  . CMP14+EGFR  . CBC with Differential/Platelet  .  Lipid panel    Caryl Pina, MD [facility] 03/13/2016, 9:03 AM

## 2016-03-14 LAB — CMP14+EGFR
ALBUMIN: 4 g/dL (ref 3.5–4.8)
ALK PHOS: 54 IU/L (ref 39–117)
ALT: 9 IU/L (ref 0–44)
AST: 19 IU/L (ref 0–40)
Albumin/Globulin Ratio: 1.5 (ref 1.2–2.2)
BUN / CREAT RATIO: 16 (ref 10–24)
BUN: 17 mg/dL (ref 8–27)
Bilirubin Total: 0.8 mg/dL (ref 0.0–1.2)
CALCIUM: 9.2 mg/dL (ref 8.6–10.2)
CO2: 26 mmol/L (ref 18–29)
CREATININE: 1.05 mg/dL (ref 0.76–1.27)
Chloride: 103 mmol/L (ref 96–106)
GFR, EST AFRICAN AMERICAN: 78 mL/min/{1.73_m2} (ref >59)
GFR, EST NON AFRICAN AMERICAN: 67 mL/min/{1.73_m2} (ref >59)
GLUCOSE: 106 mg/dL — AB (ref 65–99)
Globulin, Total: 2.6 g/dL (ref 1.5–4.5)
Potassium: 4.3 mmol/L (ref 3.5–5.2)
Sodium: 142 mmol/L (ref 134–144)
Total Protein: 6.6 g/dL (ref 6.0–8.5)

## 2016-03-14 LAB — CBC WITH DIFFERENTIAL/PLATELET
BASOS: 1 %
Basophils Absolute: 0 10*3/uL (ref 0.0–0.2)
EOS (ABSOLUTE): 0.2 10*3/uL (ref 0.0–0.4)
EOS: 5 %
HEMATOCRIT: 36.3 % — AB (ref 37.5–51.0)
HEMOGLOBIN: 11.8 g/dL — AB (ref 12.6–17.7)
IMMATURE GRANULOCYTES: 0 %
Immature Grans (Abs): 0 10*3/uL (ref 0.0–0.1)
LYMPHS ABS: 1.3 10*3/uL (ref 0.7–3.1)
Lymphs: 28 %
MCH: 29.9 pg (ref 26.6–33.0)
MCHC: 32.5 g/dL (ref 31.5–35.7)
MCV: 92 fL (ref 79–97)
MONOCYTES: 6 %
Monocytes Absolute: 0.3 10*3/uL (ref 0.1–0.9)
Neutrophils Absolute: 2.7 10*3/uL (ref 1.4–7.0)
Neutrophils: 60 %
Platelets: 472 10*3/uL — ABNORMAL HIGH (ref 150–379)
RBC: 3.94 x10E6/uL — AB (ref 4.14–5.80)
RDW: 13.5 % (ref 12.3–15.4)
WBC: 4.5 10*3/uL (ref 3.4–10.8)

## 2016-03-14 LAB — LIPID PANEL
CHOL/HDL RATIO: 2.6 ratio (ref 0.0–5.0)
Cholesterol, Total: 133 mg/dL (ref 100–199)
HDL: 52 mg/dL (ref >39)
LDL CALC: 73 mg/dL (ref 0–99)
TRIGLYCERIDES: 39 mg/dL (ref 0–149)
VLDL Cholesterol Cal: 8 mg/dL (ref 5–40)

## 2016-03-20 ENCOUNTER — Ambulatory Visit (INDEPENDENT_AMBULATORY_CARE_PROVIDER_SITE_OTHER): Payer: Medicare HMO

## 2016-03-20 DIAGNOSIS — Z23 Encounter for immunization: Secondary | ICD-10-CM | POA: Diagnosis not present

## 2016-03-21 ENCOUNTER — Ambulatory Visit: Payer: Medicare HMO

## 2016-04-05 ENCOUNTER — Other Ambulatory Visit: Payer: Self-pay | Admitting: Family

## 2016-04-08 DIAGNOSIS — R69 Illness, unspecified: Secondary | ICD-10-CM | POA: Diagnosis not present

## 2016-04-09 ENCOUNTER — Other Ambulatory Visit: Payer: Self-pay | Admitting: Family

## 2016-04-09 DIAGNOSIS — R69 Illness, unspecified: Secondary | ICD-10-CM | POA: Diagnosis not present

## 2016-04-12 ENCOUNTER — Other Ambulatory Visit: Payer: Self-pay | Admitting: Family

## 2016-04-17 DIAGNOSIS — Z6826 Body mass index (BMI) 26.0-26.9, adult: Secondary | ICD-10-CM | POA: Diagnosis not present

## 2016-04-17 DIAGNOSIS — E1169 Type 2 diabetes mellitus with other specified complication: Secondary | ICD-10-CM | POA: Diagnosis not present

## 2016-04-17 DIAGNOSIS — Z0000 Encounter for general adult medical examination without abnormal findings: Secondary | ICD-10-CM | POA: Diagnosis not present

## 2016-04-17 DIAGNOSIS — E1151 Type 2 diabetes mellitus with diabetic peripheral angiopathy without gangrene: Secondary | ICD-10-CM | POA: Diagnosis not present

## 2016-04-17 DIAGNOSIS — E7800 Pure hypercholesterolemia, unspecified: Secondary | ICD-10-CM | POA: Diagnosis not present

## 2016-04-17 DIAGNOSIS — I10 Essential (primary) hypertension: Secondary | ICD-10-CM | POA: Diagnosis not present

## 2016-04-27 ENCOUNTER — Other Ambulatory Visit: Payer: Self-pay | Admitting: Family Medicine

## 2016-05-20 ENCOUNTER — Other Ambulatory Visit: Payer: Self-pay | Admitting: Family

## 2016-06-17 ENCOUNTER — Encounter: Payer: Self-pay | Admitting: Family Medicine

## 2016-06-17 ENCOUNTER — Ambulatory Visit (INDEPENDENT_AMBULATORY_CARE_PROVIDER_SITE_OTHER): Payer: Medicare HMO | Admitting: Family Medicine

## 2016-06-17 VITALS — BP 130/73 | HR 62 | Temp 96.7°F | Ht 69.0 in | Wt 188.4 lb

## 2016-06-17 DIAGNOSIS — I10 Essential (primary) hypertension: Secondary | ICD-10-CM

## 2016-06-17 NOTE — Progress Notes (Signed)
BP (!) 146/77   Pulse 63   Temp (!) 96.7 F (35.9 C) (Oral)   Ht 5\' 9"  (1.753 m)   Wt 188 lb 6.4 oz (85.5 kg)   BMI 27.82 kg/m    Subjective:    Patient ID: Joseph Byrd, male    DOB: 1934/03/05, 83 y.o.   MRN: [id number]  HPI: Joseph Byrd is a 83 y.o. male presenting on 06/17/2016 for Hypertension (3 mos)   HPI Hypertension recheck Patient is coming in today for hypertension recheck. His initial blood pressure was 146/77. His secondary blood pressure was 130s over 70s. He has a history of CAD and is currently on Coreg and Imdur and Cardura and Diovan. He denies any issues with his medication. He does say that he has been getting occasional palpitations most recently 1 week ago but they do not last very long and then go away on their own. He also says he has been occasionally getting shortness of breath when he is exerting himself, he does think maybe it was doing more than he normally does but he will keep a close eye on that. He does have cardiologist that he sees in June but he will give them a call to go see them sooner if this recurs frequently. She denies any chest pain.  Relevant past medical, surgical, family and social history reviewed and updated as indicated. Interim medical history since our last visit reviewed. Allergies and medications reviewed and updated.  Review of Systems  Constitutional: Negative for chills and fever.  HENT: Negative for congestion.   Respiratory: Positive for shortness of breath. Negative for cough, chest tightness and wheezing.   Cardiovascular: Negative for chest pain and leg swelling.  Musculoskeletal: Negative for back pain and gait problem.  Skin: Negative for rash.  Neurological: Negative for dizziness, weakness and headaches.  All other systems reviewed and are negative.   Per HPI unless specifically indicated above     Objective:    BP (!) 146/77   Pulse 63   Temp (!) 96.7 F (35.9 C) (Oral)   Ht 5\' 9"  (1.753 m)    Wt 188 lb 6.4 oz (85.5 kg)   BMI 27.82 kg/m   Wt Readings from Last 3 Encounters:  06/17/16 188 lb 6.4 oz (85.5 kg)  03/13/16 188 lb 2 oz (85.3 kg)  12/11/15 190 lb 6.4 oz (86.4 kg)    Physical Exam  Constitutional: He is oriented to person, place, and time. He appears well-developed and well-nourished. No distress.  Eyes: Conjunctivae are normal. Right eye exhibits no discharge. Left eye exhibits no discharge. No scleral icterus.  Cardiovascular: Normal rate, regular rhythm, normal heart sounds and intact distal pulses.   No murmur heard. Pulmonary/Chest: Effort normal and breath sounds normal. No respiratory distress. He has no wheezes. He has no rales.  Musculoskeletal: Normal range of motion. He exhibits no edema.  Neurological: He is alert and oriented to person, place, and time. Coordination normal.  Skin: Skin is warm and dry. No rash noted. He is not diaphoretic.  Psychiatric: He has a normal mood and affect. His behavior is normal.  Nursing note and vitals reviewed.     Assessment & Plan:   Problem List Items Addressed This Visit      Cardiovascular and Mediastinum   Essential hypertension, benign - Primary    Elevated initially, no change medication, within normal limits for his age category and has been low before.  Follow up plan: Return in about 6 months (around 12/15/2016), or if symptoms worsen or fail to improve, for Hypertension recheck.  Counseling provided for all of the vaccine components No orders of the defined types were placed in this encounter.   Caryl Pina, MD [facility] 06/17/2016, 8:51 AM

## 2016-06-17 NOTE — Assessment & Plan Note (Addendum)
Elevated initially, no change medication, within normal limits for his age category and has been low before.

## 2016-07-05 DIAGNOSIS — R69 Illness, unspecified: Secondary | ICD-10-CM | POA: Diagnosis not present

## 2016-07-14 ENCOUNTER — Other Ambulatory Visit: Payer: Self-pay | Admitting: Family

## 2016-08-17 ENCOUNTER — Other Ambulatory Visit: Payer: Self-pay | Admitting: Family Medicine

## 2016-08-30 ENCOUNTER — Other Ambulatory Visit: Payer: Self-pay | Admitting: Family Medicine

## 2016-08-30 DIAGNOSIS — E785 Hyperlipidemia, unspecified: Secondary | ICD-10-CM

## 2016-08-30 DIAGNOSIS — I2510 Atherosclerotic heart disease of native coronary artery without angina pectoris: Secondary | ICD-10-CM

## 2016-09-02 ENCOUNTER — Other Ambulatory Visit: Payer: Self-pay | Admitting: Family Medicine

## 2016-09-02 DIAGNOSIS — I10 Essential (primary) hypertension: Secondary | ICD-10-CM

## 2016-09-04 DIAGNOSIS — R69 Illness, unspecified: Secondary | ICD-10-CM | POA: Diagnosis not present

## 2016-09-21 ENCOUNTER — Other Ambulatory Visit: Payer: Self-pay | Admitting: Family Medicine

## 2016-09-30 DIAGNOSIS — R69 Illness, unspecified: Secondary | ICD-10-CM | POA: Diagnosis not present

## 2016-10-21 ENCOUNTER — Other Ambulatory Visit: Payer: Self-pay | Admitting: Family

## 2016-10-27 ENCOUNTER — Other Ambulatory Visit: Payer: Self-pay | Admitting: Family Medicine

## 2016-11-11 DIAGNOSIS — R69 Illness, unspecified: Secondary | ICD-10-CM | POA: Diagnosis not present

## 2016-11-14 ENCOUNTER — Other Ambulatory Visit: Payer: Self-pay | Admitting: Family Medicine

## 2016-11-27 DIAGNOSIS — I25119 Atherosclerotic heart disease of native coronary artery with unspecified angina pectoris: Secondary | ICD-10-CM | POA: Diagnosis not present

## 2016-11-27 DIAGNOSIS — I10 Essential (primary) hypertension: Secondary | ICD-10-CM | POA: Diagnosis not present

## 2016-11-27 DIAGNOSIS — E782 Mixed hyperlipidemia: Secondary | ICD-10-CM | POA: Diagnosis not present

## 2016-11-27 DIAGNOSIS — I70213 Atherosclerosis of native arteries of extremities with intermittent claudication, bilateral legs: Secondary | ICD-10-CM | POA: Diagnosis not present

## 2016-11-28 ENCOUNTER — Other Ambulatory Visit: Payer: Self-pay | Admitting: Family

## 2016-11-28 DIAGNOSIS — I10 Essential (primary) hypertension: Secondary | ICD-10-CM

## 2016-11-29 ENCOUNTER — Other Ambulatory Visit: Payer: Self-pay | Admitting: Family Medicine

## 2016-11-29 DIAGNOSIS — I2510 Atherosclerotic heart disease of native coronary artery without angina pectoris: Secondary | ICD-10-CM

## 2016-11-29 DIAGNOSIS — E785 Hyperlipidemia, unspecified: Secondary | ICD-10-CM

## 2016-12-07 DIAGNOSIS — I200 Unstable angina: Secondary | ICD-10-CM | POA: Diagnosis not present

## 2016-12-07 DIAGNOSIS — Z6826 Body mass index (BMI) 26.0-26.9, adult: Secondary | ICD-10-CM | POA: Diagnosis not present

## 2016-12-07 DIAGNOSIS — I25119 Atherosclerotic heart disease of native coronary artery with unspecified angina pectoris: Secondary | ICD-10-CM | POA: Diagnosis not present

## 2016-12-07 DIAGNOSIS — E1169 Type 2 diabetes mellitus with other specified complication: Secondary | ICD-10-CM | POA: Diagnosis not present

## 2016-12-07 DIAGNOSIS — H8110 Benign paroxysmal vertigo, unspecified ear: Secondary | ICD-10-CM | POA: Diagnosis not present

## 2016-12-07 DIAGNOSIS — I10 Essential (primary) hypertension: Secondary | ICD-10-CM | POA: Diagnosis not present

## 2016-12-07 DIAGNOSIS — I70219 Atherosclerosis of native arteries of extremities with intermittent claudication, unspecified extremity: Secondary | ICD-10-CM | POA: Diagnosis not present

## 2016-12-07 DIAGNOSIS — Z7902 Long term (current) use of antithrombotics/antiplatelets: Secondary | ICD-10-CM | POA: Diagnosis not present

## 2016-12-07 DIAGNOSIS — Z0000 Encounter for general adult medical examination without abnormal findings: Secondary | ICD-10-CM | POA: Diagnosis not present

## 2016-12-07 DIAGNOSIS — E7800 Pure hypercholesterolemia, unspecified: Secondary | ICD-10-CM | POA: Diagnosis not present

## 2016-12-07 DIAGNOSIS — E1151 Type 2 diabetes mellitus with diabetic peripheral angiopathy without gangrene: Secondary | ICD-10-CM | POA: Diagnosis not present

## 2016-12-07 DIAGNOSIS — G3184 Mild cognitive impairment, so stated: Secondary | ICD-10-CM | POA: Diagnosis not present

## 2016-12-15 ENCOUNTER — Emergency Department (HOSPITAL_COMMUNITY)
Admission: EM | Admit: 2016-12-15 | Discharge: 2016-12-15 | Disposition: A | Discharge status: Home / Self Care | Payer: Medicare HMO | Attending: Emergency Medicine | Admitting: Emergency Medicine

## 2016-12-15 DIAGNOSIS — Z7982 Long term (current) use of aspirin: Secondary | ICD-10-CM | POA: Insufficient documentation

## 2016-12-15 DIAGNOSIS — E119 Type 2 diabetes mellitus without complications: Secondary | ICD-10-CM | POA: Diagnosis not present

## 2016-12-15 DIAGNOSIS — I10 Essential (primary) hypertension: Secondary | ICD-10-CM | POA: Insufficient documentation

## 2016-12-15 DIAGNOSIS — Z79899 Other long term (current) drug therapy: Secondary | ICD-10-CM | POA: Diagnosis not present

## 2016-12-15 DIAGNOSIS — Z87891 Personal history of nicotine dependence: Secondary | ICD-10-CM | POA: Diagnosis not present

## 2016-12-15 DIAGNOSIS — I2510 Atherosclerotic heart disease of native coronary artery without angina pectoris: Secondary | ICD-10-CM | POA: Diagnosis not present

## 2016-12-15 DIAGNOSIS — I70299 Other atherosclerosis of native arteries of extremities, unspecified extremity: Secondary | ICD-10-CM | POA: Insufficient documentation

## 2016-12-15 DIAGNOSIS — I959 Hypotension, unspecified: Secondary | ICD-10-CM | POA: Diagnosis not present

## 2016-12-15 NOTE — ED Triage Notes (Signed)
Pt reports both a low BP reading on his home BP machine and a high reading on his home BP machine.  Pt reports health at baseline without shortness of breath, fatigue or pain.

## 2016-12-15 NOTE — Discharge Instructions (Signed)
Follow up with your primary care doctor tomorrow.

## 2016-12-15 NOTE — ED Provider Notes (Signed)
[facility] DEPT Provider Note   CSN: [id number] Arrival date & time: 12/15/16  1014     History   Chief Complaint Chief Complaint  Patient presents with  . Hypotension    HPI Joseph Byrd is a 83 y.o. male.  Patient reports low blood pressure reading at home this morning. He has hypertension and is on several medications. No chest pain, dyspnea, neurological deficits, syncope.  Past medical history includes hypertension, diabetes, CAD. He feels totally normal. Wife reports blood pressure at home as 102/66, 101/63, 91/50.      Past Medical History:  Diagnosis Date  . Arthritis    "knees, wrists, knuckles" (04/26/2014)  . CAD (coronary artery disease)   . Headache    "had a couple/wk til ~ 2 wks ago" (04/26/2014)  . Hyperlipidemia   . Hypertension   . PAD (peripheral artery disease) ([place])   . Type II diabetes mellitus [facility])     Patient Active Problem List   Diagnosis Date Noted  . Vitamin D deficiency 08/30/2014  . Postsurgical percutaneous transluminal coronary angioplasty (PTCA) status 04/26/2014  . Cataract   . Arthritis of both knees 01/05/2013  . Essential hypertension, benign 08/22/2010  . CAD, multiple vessel 08/22/2010  . Hyperlipidemia with target LDL less than 70 08/22/2010  . PVD (peripheral vascular disease) with claudication ([place]) 08/22/2010    Past Surgical History:  Procedure Laterality Date  . BACK SURGERY    . CATARACT EXTRACTION W/ INTRAOCULAR LENS  IMPLANT, BILATERAL Bilateral ~ 2014  . CORONARY ANGIOPLASTY WITH STENT PLACEMENT  08/2010; 04/26/2014   "1, LAD; 1"  . LEFT HEART CATHETERIZATION WITH CORONARY ANGIOGRAM N/A 04/26/2014   Procedure: LEFT HEART CATHETERIZATION WITH CORONARY ANGIOGRAM;  Surgeon: Laverda Page, MD;  Location[facility] C[facility] LAB;  Service: Cardiovascular;  Laterality: N/A;  . LUMBAR LAMINECTOMY/DECOMPRESSION MICRODISCECTOMY  01/2010       Home Medications    Prior to Admission medications   Medication Sig  Start Date End Date Taking? Authorizing Provider  aspirin 81 MG tablet Take 81 mg by mouth daily.      [provider]  atorvastatin (LIPITOR) 20 MG tablet TAKE 1 TABLET (20 MG TOTAL) BY MOUTH DAILY. 11/29/16   Dettinger, [name], MD  carvedilol (COREG) 3.125 MG tablet Take 1 tablet (3.125 mg total) by mouth 2 (two) times daily with a meal. 04/05/14   Hassell Done, Mary-Margaret, FNP  carvedilol (COREG) 6.25 MG tablet TAKE 1 TAB TWICE DAILY WITH A MEAL 11/14/16   Dettinger, [name], MD  cilostazol (PLETAL) 100 MG tablet TAKE 1 TABLET (100 MG TOTAL) BY MOUTH 2 (TWO) TIMES DAILY. 10/29/16   Dettinger, [name], MD  doxazosin (CARDURA) 8 MG tablet TAKE 1 TABLET DAILY AT BEDTIME 09/23/16   Dettinger, [name], MD  isosorbide mononitrate (IMDUR) 60 MG 24 hr tablet TAKE 1 TABLET BY MOUTH DAILY 10/21/16   Dettinger, [name], MD  meclizine (ANTIVERT) 25 MG tablet TAKE 2 TABLETS BY MOUTH THREE TIMES DAILY AS NEEDED 07/15/16   Evelina Dun A, FNP  Multiple Vitamin (MULTIVITAMIN WITH MINERALS) TABS tablet Take 1 tablet by mouth daily.    [provider]  NITROSTAT 0.4 MG SL tablet Place 0.4 mg under the tongue every 5 (five) minutes as needed for chest pain. Reported on 08/15/2015 04/14/14   [provider]  ONETOUCH DELICA LANCETS 23R MISC CHECK BLOOD GLUCOSE DAILY 04/09/16   Sharion Balloon, FNP  [facility] VERIO test strip TEST BG DAILY DX R73.9 04/08/16  Hawks, Christy A, FNP  valsartan-hydrochlorothiazide (DIOVAN-HCT) 160-12.5 MG tablet TAKE 1 TABLET BY MOUTH DAILY. 11/28/16   Dettinger, [name], MD    Family History No family history on file.  Social History Social History  Substance Use Topics  . Smoking status: Former Smoker    Packs/day: 0.12    Years: 10.00    Types: Cigarettes    Quit date: 09/04/1982  . Smokeless tobacco: Never Used  . Alcohol use No     Allergies   Patient has no known allergies.   Review of Systems Review of Systems  All other systems reviewed  and are negative.    Physical Exam Updated Vital Signs BP 130/86 (BP Location: Right Arm)   Pulse 66   Temp 98.2 F (36.8 C) (Oral)   Resp 18   SpO2 97%   Physical Exam  Constitutional: He is oriented to person, place, and time. He appears well-developed and well-nourished.  HENT:  Head: Normocephalic and atraumatic.  Eyes: Conjunctivae are normal.  Neck: Neck supple.  Cardiovascular: Normal rate and regular rhythm.   Pulmonary/Chest: Effort normal and breath sounds normal.  Abdominal: Soft. Bowel sounds are normal.  Musculoskeletal: Normal range of motion.  Neurological: He is alert and oriented to person, place, and time.  Skin: Skin is warm and dry.  Psychiatric: He has a normal mood and affect. His behavior is normal.  Nursing note and vitals reviewed.    ED Treatments / Results  Labs (all labs ordered are listed, but only abnormal results are displayed) Labs Reviewed - No data to display  EKG  EKG Interpretation None       Radiology No results found.  Procedures Procedures (including critical care time)  Medications Ordered in ED Medications - No data to display   Initial Impression / Assessment and Plan / ED Course  I have reviewed the triage vital signs and the nursing notes.  Pertinent labs & imaging results that were available during my care of the patient were reviewed by me and considered in my medical decision making (see chart for details).     Patient has normal vital signs in emergency department. He is pleasant and alert without complaints. He has primary care follow-up tomorrow.  Final Clinical Impressions(s) / ED Diagnoses   Final diagnoses:  Hypotension, unspecified hypotension type    New Prescriptions New Prescriptions   No medications on file     Nat Christen, MD 12/15/16 1047

## 2016-12-16 ENCOUNTER — Ambulatory Visit (INDEPENDENT_AMBULATORY_CARE_PROVIDER_SITE_OTHER): Payer: Medicare HMO | Admitting: Family Medicine

## 2016-12-16 ENCOUNTER — Encounter: Payer: Self-pay | Admitting: Family Medicine

## 2016-12-16 ENCOUNTER — Ambulatory Visit: Payer: Medicare HMO | Admitting: Family Medicine

## 2016-12-16 VITALS — BP 135/80 | HR 58 | Temp 97.3°F | Ht 69.0 in | Wt 184.0 lb

## 2016-12-16 DIAGNOSIS — E785 Hyperlipidemia, unspecified: Secondary | ICD-10-CM

## 2016-12-16 DIAGNOSIS — I10 Essential (primary) hypertension: Secondary | ICD-10-CM | POA: Diagnosis not present

## 2016-12-16 DIAGNOSIS — D649 Anemia, unspecified: Secondary | ICD-10-CM | POA: Diagnosis not present

## 2016-12-16 DIAGNOSIS — I2510 Atherosclerotic heart disease of native coronary artery without angina pectoris: Secondary | ICD-10-CM | POA: Diagnosis not present

## 2016-12-16 MED ORDER — DOXAZOSIN MESYLATE 8 MG PO TABS: 8.0000 mg | ORAL_TABLET | Freq: Every day | ORAL | 2 refills | Status: DC

## 2016-12-16 MED ORDER — VALSARTAN-HYDROCHLOROTHIAZIDE 160-12.5 MG PO TABS: 1.0000 | ORAL_TABLET | Freq: Every day | ORAL | 2 refills | Status: DC

## 2016-12-16 MED ORDER — ISOSORBIDE MONONITRATE ER 30 MG PO TB24: 30.0000 mg | ORAL_TABLET | Freq: Every day | ORAL | 2 refills | Status: DC

## 2016-12-16 MED ORDER — CARVEDILOL 3.125 MG PO TABS: 3.1250 mg | ORAL_TABLET | Freq: Two times a day (BID) | ORAL | 2 refills | Status: DC

## 2016-12-16 NOTE — Assessment & Plan Note (Signed)
Stable and asymptomatic currently

## 2016-12-16 NOTE — Progress Notes (Signed)
BP 135/80   Pulse (!) 58   Temp (!) 97.3 F (36.3 C) (Oral)   Ht '5\' 9"'  (1.753 m)   Wt 184 lb (83.5 kg)   BMI 27.17 kg/m    Subjective:    Patient ID: Joseph Byrd, male    DOB: 06-Jan-1934, 83 y.o.   MRN: [id number]  HPI: Chananya Canizalez is a 83 y.o. male presenting on 12/16/2016 for Hypertension (followup; patient is fasting) and Hyperlipidemia   HPI Hypertension Patient is currently on valsartan-hydrochlorothiazide and Imdur and Cardura and Coreg, and their blood pressure today is 135/80. He says that he has been having a lot of issues in the past couple weeks again with his blood pressure being down and to the point where yesterday he felt like his heart rate was high and he went to the emergency department and the said everything looked fine with this heart and his blood pressure was normal there but he is still having that recurrently. Patient is having lightheadedness or dizziness but no syncope. Patient denies headaches, blurred vision, chest pains, shortness of breath, or weakness.  Hyperlipidemia Patient is coming in for recheck of his hyperlipidemia. The patient is currently taking Lipitor. They deny any issues with myalgias or history of liver damage from it. They deny any focal numbness or weakness or chest pain.   Anemia Patient is coming in for recheck of anemia. He has been having some lightheadedness and dizziness and had one episode of palpitations yesterday. His last hemoglobin was 11 and we will recheck it today. He denies any bleeding episodes from anywhere that he knows of. He denies any chest pain or shortness of breath he denies any generalized feelings of weakness and says overall he feels pretty well.  Depression screen [facility] 2/9 12/16/2016 06/17/2016 03/13/2016 12/11/2015 09/07/2015  Decreased Interest 0 0 0 0 0  Down, Depressed, Hopeless 0 0 0 0 0  PHQ - 2 Score 0 0 0 0 0    Relevant past medical, surgical, family and social history reviewed and updated as  indicated. Interim medical history since our last visit reviewed. Allergies and medications reviewed and updated.  Review of Systems  Constitutional: Negative for chills and fever.  Eyes: Negative for discharge.  Respiratory: Negative for shortness of breath and wheezing.   Cardiovascular: Negative for chest pain and leg swelling.  Musculoskeletal: Negative for back pain and gait problem.  Skin: Negative for rash.  Neurological: Positive for dizziness and light-headedness. Negative for syncope, weakness, numbness and headaches.  All other systems reviewed and are negative.   Per HPI unless specifically indicated above   Allergies as of 12/16/2016   No Known Allergies     Medication List       Accurate as of 12/16/16  8:51 AM. Always use your most recent med list.          aspirin 81 MG tablet Take 81 mg by mouth daily.   atorvastatin 20 MG tablet Commonly known as:  LIPITOR TAKE 1 TABLET (20 MG TOTAL) BY MOUTH DAILY.   carvedilol 3.125 MG tablet Commonly known as:  COREG Take 1 tablet (3.125 mg total) by mouth 2 (two) times daily with a meal.   carvedilol 6.25 MG tablet Commonly known as:  COREG TAKE 1 TAB TWICE DAILY WITH A MEAL   cilostazol 100 MG tablet Commonly known as:  PLETAL TAKE 1 TABLET (100 MG TOTAL) BY MOUTH 2 (TWO) TIMES DAILY.   doxazosin 8 MG tablet Commonly known as:  CARDURA TAKE 1 TABLET DAILY AT BEDTIME   isosorbide mononitrate 60 MG 24 hr tablet Commonly known as:  IMDUR TAKE 1 TABLET BY MOUTH DAILY   meclizine 25 MG tablet Commonly known as:  ANTIVERT TAKE 2 TABLETS BY MOUTH THREE TIMES DAILY AS NEEDED   multivitamin with minerals Tabs tablet Take 1 tablet by mouth daily.   NITROSTAT 0.4 MG SL tablet Generic drug:  nitroGLYCERIN Place 0.4 mg under the tongue every 5 (five) minutes as needed for chest pain. Reported on 4/58/5929   ONETOUCH DELICA LANCETS 24M Misc CHECK BLOOD GLUCOSE DAILY   ONETOUCH VERIO test strip Generic  drug:  glucose blood TEST BG DAILY DX R73.9   valsartan-hydrochlorothiazide 160-12.5 MG tablet Commonly known as:  DIOVAN-HCT TAKE 1 TABLET BY MOUTH DAILY.          Objective:    BP 135/80   Pulse (!) 58   Temp (!) 97.3 F (36.3 C) (Oral)   Ht '5\' 9"'  (1.753 m)   Wt 184 lb (83.5 kg)   BMI 27.17 kg/m   Wt Readings from Last 3 Encounters:  12/16/16 184 lb (83.5 kg)  06/17/16 188 lb 6.4 oz (85.5 kg)  03/13/16 188 lb 2 oz (85.3 kg)    Physical Exam  Constitutional: He is oriented to person, place, and time. He appears well-developed and well-nourished. No distress.  Eyes: Conjunctivae are normal. No scleral icterus.  Neck: Neck supple. No thyromegaly present.  Cardiovascular: Normal rate, regular rhythm, normal heart sounds and intact distal pulses.   No murmur heard. Pulmonary/Chest: Effort normal and breath sounds normal. No respiratory distress. He has no wheezes. He has no rales.  Musculoskeletal: Normal range of motion. He exhibits no edema.  Lymphadenopathy:    He has no cervical adenopathy.  Neurological: He is alert and oriented to person, place, and time. Coordination normal.  Skin: Skin is warm and dry. No rash noted. He is not diaphoretic.  Psychiatric: He has a normal mood and affect. His behavior is normal.  Nursing note and vitals reviewed.       Assessment & Plan:   Problem List Items Addressed This Visit      Cardiovascular and Mediastinum   Essential hypertension, benign - Primary   Relevant Medications   isosorbide mononitrate (IMDUR) 30 MG 24 hr tablet   doxazosin (CARDURA) 8 MG tablet   valsartan-hydrochlorothiazide (DIOVAN-HCT) 160-12.5 MG tablet   carvedilol (COREG) 3.125 MG tablet   Other Relevant Orders   CMP14+EGFR   CAD, multiple vessel    Stable and asymptomatic currently      Relevant Medications   isosorbide mononitrate (IMDUR) 30 MG 24 hr tablet   doxazosin (CARDURA) 8 MG tablet   valsartan-hydrochlorothiazide (DIOVAN-HCT)  160-12.5 MG tablet   carvedilol (COREG) 3.125 MG tablet     Other   Hyperlipidemia with target LDL less than 70   Relevant Medications   isosorbide mononitrate (IMDUR) 30 MG 24 hr tablet   doxazosin (CARDURA) 8 MG tablet   valsartan-hydrochlorothiazide (DIOVAN-HCT) 160-12.5 MG tablet   carvedilol (COREG) 3.125 MG tablet   Other Relevant Orders   Lipid panel   Anemia   Relevant Orders   CBC with Differential/Platelet     Cut the Imdur in half because patient is still feeling lightheaded and dizzy and his blood pressures going down at home into the 90s and low 628M Systolic.  Follow up plan: Return in about 6 months (around 06/18/2017), or if symptoms worsen or fail to improve,  for Hypertension cholesterol recheck.  Counseling provided for all of the vaccine components Orders Placed This Encounter  Procedures  . CMP14+EGFR  . CBC with Differential/Platelet  . Lipid panel    Caryl Pina, MD [facility] 12/16/2016, 8:51 AM

## 2016-12-17 LAB — LIPID PANEL
CHOLESTEROL TOTAL: 120 mg/dL (ref 100–199)
Chol/HDL Ratio: 2.1 ratio (ref 0.0–5.0)
HDL: 56 mg/dL (ref >39)
LDL Calculated: 55 mg/dL (ref 0–99)
Triglycerides: 44 mg/dL (ref 0–149)
VLDL CHOLESTEROL CAL: 9 mg/dL (ref 5–40)

## 2016-12-17 LAB — CMP14+EGFR
ALBUMIN: 4.1 g/dL (ref 3.5–4.7)
ALT: 20 IU/L (ref 0–44)
AST: 25 IU/L (ref 0–40)
Albumin/Globulin Ratio: 1.5 (ref 1.2–2.2)
Alkaline Phosphatase: 63 IU/L (ref 39–117)
BUN / CREAT RATIO: 14 (ref 10–24)
BUN: 15 mg/dL (ref 8–27)
Bilirubin Total: 1 mg/dL (ref 0.0–1.2)
CALCIUM: 9.3 mg/dL (ref 8.6–10.2)
CO2: 24 mmol/L (ref 20–29)
CREATININE: 1.04 mg/dL (ref 0.76–1.27)
Chloride: 105 mmol/L (ref 96–106)
GFR calc Af Amer: 78 mL/min/{1.73_m2} (ref >59)
GFR, EST NON AFRICAN AMERICAN: 67 mL/min/{1.73_m2} (ref >59)
GLOBULIN, TOTAL: 2.8 g/dL (ref 1.5–4.5)
GLUCOSE: 97 mg/dL (ref 65–99)
Potassium: 4.6 mmol/L (ref 3.5–5.2)
SODIUM: 145 mmol/L — AB (ref 134–144)
Total Protein: 6.9 g/dL (ref 6.0–8.5)

## 2016-12-17 LAB — CBC WITH DIFFERENTIAL/PLATELET
BASOS: 0 %
Basophils Absolute: 0 10*3/uL (ref 0.0–0.2)
EOS (ABSOLUTE): 0.2 10*3/uL (ref 0.0–0.4)
Eos: 4 %
HEMOGLOBIN: 12.1 g/dL — AB (ref 13.0–17.7)
Hematocrit: 38.7 % (ref 37.5–51.0)
IMMATURE GRANULOCYTES: 0 %
Immature Grans (Abs): 0 10*3/uL (ref 0.0–0.1)
LYMPHS ABS: 1.1 10*3/uL (ref 0.7–3.1)
Lymphs: 20 %
MCH: 30 pg (ref 26.6–33.0)
MCHC: 31.3 g/dL — AB (ref 31.5–35.7)
MCV: 96 fL (ref 79–97)
MONOCYTES: 7 %
MONOS ABS: 0.4 10*3/uL (ref 0.1–0.9)
NEUTROS PCT: 69 %
Neutrophils Absolute: 3.7 10*3/uL (ref 1.4–7.0)
PLATELETS: 515 10*3/uL — AB (ref 150–379)
RBC: 4.03 x10E6/uL — AB (ref 4.14–5.80)
RDW: 13.7 % (ref 12.3–15.4)
WBC: 5.4 10*3/uL (ref 3.4–10.8)

## 2016-12-19 DIAGNOSIS — H5203 Hypermetropia, bilateral: Secondary | ICD-10-CM | POA: Diagnosis not present

## 2016-12-19 DIAGNOSIS — H40013 Open angle with borderline findings, low risk, bilateral: Secondary | ICD-10-CM | POA: Diagnosis not present

## 2016-12-28 ENCOUNTER — Other Ambulatory Visit: Payer: Self-pay | Admitting: Family

## 2016-12-31 DIAGNOSIS — R69 Illness, unspecified: Secondary | ICD-10-CM | POA: Diagnosis not present

## 2017-01-25 ENCOUNTER — Other Ambulatory Visit: Payer: Self-pay | Admitting: Family Medicine

## 2017-02-06 DIAGNOSIS — R69 Illness, unspecified: Secondary | ICD-10-CM | POA: Diagnosis not present

## 2017-02-27 ENCOUNTER — Other Ambulatory Visit: Payer: Self-pay | Admitting: Family

## 2017-03-04 ENCOUNTER — Other Ambulatory Visit: Payer: Self-pay | Admitting: Family Medicine

## 2017-03-04 DIAGNOSIS — E785 Hyperlipidemia, unspecified: Secondary | ICD-10-CM

## 2017-03-04 DIAGNOSIS — I2510 Atherosclerotic heart disease of native coronary artery without angina pectoris: Secondary | ICD-10-CM

## 2017-03-04 NOTE — Telephone Encounter (Signed)
Our records show a 90 day supply was sent in on 01/27/17.  [facility] spoke with [facility] and that is correct, this medication is on file and they will go ahead and get refill ready.  Patient informed.

## 2017-03-04 NOTE — Telephone Encounter (Signed)
What is the name of the medication? Cilostazol 100 mg  Have you contacted your pharmacy to request a refill? YES  Which [facility] [place]   Patient notified that their request is being sent to the clinical staff for review and that they should receive a call once it is complete. If they do not receive a call within 24 hours they can check with their pharmacy or our office.

## 2017-03-24 ENCOUNTER — Ambulatory Visit (INDEPENDENT_AMBULATORY_CARE_PROVIDER_SITE_OTHER): Payer: Medicare HMO | Admitting: *Deleted

## 2017-03-24 DIAGNOSIS — Z23 Encounter for immunization: Secondary | ICD-10-CM | POA: Diagnosis not present

## 2017-04-05 DIAGNOSIS — R69 Illness, unspecified: Secondary | ICD-10-CM | POA: Diagnosis not present

## 2017-04-14 ENCOUNTER — Encounter: Payer: Self-pay | Admitting: *Deleted

## 2017-05-20 ENCOUNTER — Other Ambulatory Visit: Payer: Self-pay | Admitting: *Deleted

## 2017-05-20 DIAGNOSIS — I10 Essential (primary) hypertension: Secondary | ICD-10-CM

## 2017-05-20 MED ORDER — VALSARTAN-HYDROCHLOROTHIAZIDE 160-12.5 MG PO TABS: 1.0000 | ORAL_TABLET | Freq: Every day | ORAL | 0 refills | Status: DC

## 2017-05-20 NOTE — Telephone Encounter (Signed)
OV 06/19/17

## 2017-05-29 ENCOUNTER — Other Ambulatory Visit: Payer: Self-pay | Admitting: *Deleted

## 2017-05-29 DIAGNOSIS — I2510 Atherosclerotic heart disease of native coronary artery without angina pectoris: Secondary | ICD-10-CM

## 2017-05-29 DIAGNOSIS — E785 Hyperlipidemia, unspecified: Secondary | ICD-10-CM

## 2017-05-29 MED ORDER — CILOSTAZOL 100 MG PO TABS: ORAL_TABLET | ORAL | 0 refills | Status: DC

## 2017-05-29 MED ORDER — ATORVASTATIN CALCIUM 20 MG PO TABS: 20.0000 mg | ORAL_TABLET | Freq: Every day | ORAL | 0 refills | Status: DC

## 2017-06-05 DIAGNOSIS — R69 Illness, unspecified: Secondary | ICD-10-CM | POA: Diagnosis not present

## 2017-06-06 NOTE — Telephone Encounter (Signed)
Refill was rcvd per [place]

## 2017-06-19 ENCOUNTER — Ambulatory Visit (INDEPENDENT_AMBULATORY_CARE_PROVIDER_SITE_OTHER): Payer: Medicare HMO | Admitting: Family Medicine

## 2017-06-19 ENCOUNTER — Encounter: Payer: Self-pay | Admitting: Family Medicine

## 2017-06-19 VITALS — BP 120/69 | HR 71 | Temp 98.0°F | Ht 69.0 in | Wt 176.0 lb

## 2017-06-19 DIAGNOSIS — E559 Vitamin D deficiency, unspecified: Secondary | ICD-10-CM

## 2017-06-19 DIAGNOSIS — E785 Hyperlipidemia, unspecified: Secondary | ICD-10-CM | POA: Diagnosis not present

## 2017-06-19 DIAGNOSIS — I10 Essential (primary) hypertension: Secondary | ICD-10-CM

## 2017-06-19 NOTE — Progress Notes (Signed)
BP 120/69   Pulse 71   Temp 98 F (36.7 C) (Oral)   Ht '5\' 9"'  (1.753 m)   Wt 176 lb (79.8 kg)   BMI 25.99 kg/m    Subjective:    Patient ID: Joseph Byrd, male    DOB: 04-18-1934, 84 y.o.   MRN: [id number]  HPI: Joseph Byrd is a 84 y.o. male presenting on 06/19/2017 for Hyperlipidemia (6 mo follow up) and Hypertension   HPI Hyperlipidemia Patient is coming in for recheck of his hyperlipidemia. The patient is currently taking Lipitor. They deny any issues with myalgias or history of liver damage from it. They deny any focal numbness or weakness or chest pain.   Hypertension Patient is currently on valsartan-hydrochlorothiazide and Imdur and doxazosin and carvedilol, and their blood pressure today is 120/69. Patient denies any lightheadedness or dizziness. Patient denies headaches, blurred vision, chest pains, shortness of breath, or weakness. Denies any side effects from medication and is content with current medication.  Patient does have a history of CAD with PVD and is on medications controlling it denies any major issues and still sees  Recheck of vitamin D deficiency Vitamin D deficiency is something that is had before and he has been taking supplements, it has been a minute since he is checked it and we will check again today.  Relevant past medical, surgical, family and social history reviewed and updated as indicated. Interim medical history since our last visit reviewed. Allergies and medications reviewed and updated.  Review of Systems  Constitutional: Negative for chills and fever.  Respiratory: Negative for shortness of breath and wheezing.   Cardiovascular: Negative for chest pain and leg swelling.  Musculoskeletal: Negative for back pain and gait problem.  Skin: Negative for rash.  Neurological: Negative for dizziness, weakness, light-headedness, numbness and headaches.  All other systems reviewed and are negative.   Per HPI unless specifically  indicated above        Objective:    BP 120/69   Pulse 71   Temp 98 F (36.7 C) (Oral)   Ht '5\' 9"'  (1.753 m)   Wt 176 lb (79.8 kg)   BMI 25.99 kg/m   Wt Readings from Last 3 Encounters:  06/19/17 176 lb (79.8 kg)  12/16/16 184 lb (83.5 kg)  06/17/16 188 lb 6.4 oz (85.5 kg)    Physical Exam  Constitutional: He is oriented to person, place, and time. He appears well-developed and well-nourished. No distress.  Eyes: Conjunctivae are normal. No scleral icterus.  Neck: Neck supple. No thyromegaly present.  Cardiovascular: Normal rate, regular rhythm, normal heart sounds and intact distal pulses.  No murmur heard. Pulmonary/Chest: Effort normal and breath sounds normal. No respiratory distress. He has no wheezes.  Musculoskeletal: Normal range of motion. He exhibits no edema.  Lymphadenopathy:    He has no cervical adenopathy.  Neurological: He is alert and oriented to person, place, and time. Coordination normal.  Skin: Skin is warm and dry. No rash noted. He is not diaphoretic.  Psychiatric: He has a normal mood and affect. His behavior is normal.  Nursing note and vitals reviewed.       Assessment & Plan:   Problem List Items Addressed This Visit      Cardiovascular and Mediastinum   Essential hypertension, benign   Relevant Orders   CMP14+EGFR     Other   Hyperlipidemia with target LDL less than 70 - Primary   Relevant Orders   Lipid panel   Vitamin  D deficiency   Relevant Orders   VITAMIN D 25 Hydroxy (Vit-D Deficiency, Fractures)       Follow up plan: Return in about 6 months (around 12/17/2017), or if symptoms worsen or fail to improve, for Hypertension and hyperlipidemia.  Counseling provided for all of the vaccine components Orders Placed This Encounter  Procedures  . CMP14+EGFR  . Lipid panel  . VITAMIN D 25 Hydroxy (Vit-D Deficiency, Fractures)    Caryl Pina, MD [facility] 06/19/2017, 9:30 AM

## 2017-06-20 LAB — VITAMIN D 25 HYDROXY (VIT D DEFICIENCY, FRACTURES): Vit D, 25-Hydroxy: 42.1 ng/mL (ref 30.0–100.0)

## 2017-06-20 LAB — CMP14+EGFR
A/G RATIO: 1.4 (ref 1.2–2.2)
ALBUMIN: 4.2 g/dL (ref 3.5–4.7)
ALK PHOS: 65 IU/L (ref 39–117)
ALT: 12 IU/L (ref 0–44)
AST: 20 IU/L (ref 0–40)
BUN / CREAT RATIO: 14 (ref 10–24)
BUN: 16 mg/dL (ref 8–27)
Bilirubin Total: 0.8 mg/dL (ref 0.0–1.2)
CO2: 25 mmol/L (ref 20–29)
CREATININE: 1.13 mg/dL (ref 0.76–1.27)
Calcium: 9.4 mg/dL (ref 8.6–10.2)
Chloride: 101 mmol/L (ref 96–106)
GFR calc Af Amer: 70 mL/min/{1.73_m2} (ref >59)
GFR, EST NON AFRICAN AMERICAN: 61 mL/min/{1.73_m2} (ref >59)
Globulin, Total: 3 g/dL (ref 1.5–4.5)
Glucose: 110 mg/dL — ABNORMAL HIGH (ref 65–99)
POTASSIUM: 4.9 mmol/L (ref 3.5–5.2)
Sodium: 137 mmol/L (ref 134–144)
Total Protein: 7.2 g/dL (ref 6.0–8.5)

## 2017-06-20 LAB — LIPID PANEL
CHOL/HDL RATIO: 2.3 ratio (ref 0.0–5.0)
CHOLESTEROL TOTAL: 122 mg/dL (ref 100–199)
HDL: 54 mg/dL (ref >39)
LDL CALC: 58 mg/dL (ref 0–99)
Triglycerides: 51 mg/dL (ref 0–149)
VLDL Cholesterol Cal: 10 mg/dL (ref 5–40)

## 2017-06-24 ENCOUNTER — Telehealth: Payer: Self-pay | Admitting: *Deleted

## 2017-06-24 DIAGNOSIS — I10 Essential (primary) hypertension: Secondary | ICD-10-CM

## 2017-06-24 MED ORDER — OLMESARTAN MEDOXOMIL-HCTZ 20-12.5 MG PO TABS: 1.0000 | ORAL_TABLET | Freq: Every day | ORAL | 3 refills | Status: DC

## 2017-06-24 NOTE — Telephone Encounter (Signed)
Recall on Valsartan-hydrochlorothiazide Please send in alternative 90d supply to [facility] order And 14d supply to [place] until mail order is received

## 2017-06-24 NOTE — Addendum Note (Signed)
Addended by: Caryl Pina on: 06/24/2017 09:04 PM   Modules accepted: Orders

## 2017-06-25 ENCOUNTER — Other Ambulatory Visit: Payer: Self-pay

## 2017-06-25 MED ORDER — OLMESARTAN MEDOXOMIL-HCTZ 20-12.5 MG PO TABS: 1.0000 | ORAL_TABLET | Freq: Every day | ORAL | 0 refills | Status: DC

## 2017-06-25 MED ORDER — OLMESARTAN MEDOXOMIL-HCTZ 20-12.5 MG PO TABS: 1.0000 | ORAL_TABLET | Freq: Every day | ORAL | 3 refills | Status: DC

## 2017-06-25 NOTE — Telephone Encounter (Signed)
Rx sent in incorrectly. Sent 90 day supply to [facility] and [facility] per [facility] request. Patient notified

## 2017-06-29 DIAGNOSIS — R69 Illness, unspecified: Secondary | ICD-10-CM | POA: Diagnosis not present

## 2017-08-25 ENCOUNTER — Other Ambulatory Visit: Payer: Self-pay | Admitting: Family Medicine

## 2017-08-25 DIAGNOSIS — I10 Essential (primary) hypertension: Secondary | ICD-10-CM

## 2017-08-26 ENCOUNTER — Other Ambulatory Visit: Payer: Self-pay | Admitting: Family Medicine

## 2017-08-26 DIAGNOSIS — E785 Hyperlipidemia, unspecified: Secondary | ICD-10-CM

## 2017-08-26 DIAGNOSIS — I2510 Atherosclerotic heart disease of native coronary artery without angina pectoris: Secondary | ICD-10-CM

## 2017-08-30 ENCOUNTER — Other Ambulatory Visit: Payer: Self-pay | Admitting: Family Medicine

## 2017-08-30 DIAGNOSIS — I10 Essential (primary) hypertension: Secondary | ICD-10-CM

## 2017-09-22 DIAGNOSIS — R69 Illness, unspecified: Secondary | ICD-10-CM | POA: Diagnosis not present

## 2017-09-24 ENCOUNTER — Telehealth: Payer: Self-pay | Admitting: Family Medicine

## 2017-09-24 NOTE — Telephone Encounter (Signed)
See if they have diovan 320 1 daily

## 2017-09-24 NOTE — Telephone Encounter (Signed)
[facility] are our [facility] Benicar- covering pcp, please send in a replacement for patient.

## 2017-09-25 MED ORDER — VALSARTAN 320 MG PO TABS: 320.0000 mg | ORAL_TABLET | Freq: Every day | ORAL | 3 refills | Status: DC

## 2017-09-25 NOTE — Telephone Encounter (Signed)
Pt aware replacement sent into pharmacy and to monitor BP for any changes.

## 2017-09-26 ENCOUNTER — Other Ambulatory Visit: Payer: Self-pay | Admitting: Family

## 2017-09-28 ENCOUNTER — Other Ambulatory Visit: Payer: Self-pay | Admitting: Family Medicine

## 2017-09-29 DIAGNOSIS — R69 Illness, unspecified: Secondary | ICD-10-CM | POA: Diagnosis not present

## 2017-10-01 DIAGNOSIS — K08409 Partial loss of teeth, unspecified cause, unspecified class: Secondary | ICD-10-CM | POA: Diagnosis not present

## 2017-10-01 DIAGNOSIS — R69 Illness, unspecified: Secondary | ICD-10-CM | POA: Diagnosis not present

## 2017-10-01 DIAGNOSIS — E785 Hyperlipidemia, unspecified: Secondary | ICD-10-CM | POA: Diagnosis not present

## 2017-10-01 DIAGNOSIS — G629 Polyneuropathy, unspecified: Secondary | ICD-10-CM | POA: Diagnosis not present

## 2017-10-01 DIAGNOSIS — Z809 Family history of malignant neoplasm, unspecified: Secondary | ICD-10-CM | POA: Diagnosis not present

## 2017-10-01 DIAGNOSIS — Z7902 Long term (current) use of antithrombotics/antiplatelets: Secondary | ICD-10-CM | POA: Diagnosis not present

## 2017-10-01 DIAGNOSIS — I10 Essential (primary) hypertension: Secondary | ICD-10-CM | POA: Diagnosis not present

## 2017-10-01 DIAGNOSIS — Z7982 Long term (current) use of aspirin: Secondary | ICD-10-CM | POA: Diagnosis not present

## 2017-10-01 DIAGNOSIS — I2510 Atherosclerotic heart disease of native coronary artery without angina pectoris: Secondary | ICD-10-CM | POA: Diagnosis not present

## 2017-10-01 DIAGNOSIS — H04129 Dry eye syndrome of unspecified lacrimal gland: Secondary | ICD-10-CM | POA: Diagnosis not present

## 2017-11-11 ENCOUNTER — Other Ambulatory Visit: Payer: Self-pay | Admitting: Family Medicine

## 2017-11-11 MED ORDER — OLMESARTAN MEDOXOMIL-HCTZ 20-12.5 MG PO TABS
1.0000 | ORAL_TABLET | Freq: Every day | ORAL | 0 refills | Status: DC
Start: 2017-11-11 — End: 2018-06-22

## 2017-11-11 NOTE — Telephone Encounter (Signed)
Rx sent to pharmacy   

## 2017-11-18 ENCOUNTER — Other Ambulatory Visit: Payer: Self-pay | Admitting: Family Medicine

## 2017-11-18 DIAGNOSIS — I2510 Atherosclerotic heart disease of native coronary artery without angina pectoris: Secondary | ICD-10-CM

## 2017-11-18 DIAGNOSIS — E785 Hyperlipidemia, unspecified: Secondary | ICD-10-CM

## 2017-11-18 DIAGNOSIS — I10 Essential (primary) hypertension: Secondary | ICD-10-CM

## 2017-11-24 ENCOUNTER — Other Ambulatory Visit: Payer: Self-pay | Admitting: Family Medicine

## 2017-11-25 NOTE — Telephone Encounter (Signed)
Last seen 04/09/18

## 2017-12-09 ENCOUNTER — Ambulatory Visit (INDEPENDENT_AMBULATORY_CARE_PROVIDER_SITE_OTHER): Payer: Medicare HMO | Admitting: *Deleted

## 2017-12-09 VITALS — BP 117/68 | HR 83 | Ht 69.0 in | Wt 176.0 lb

## 2017-12-09 DIAGNOSIS — Z0000 Encounter for general adult medical examination without abnormal findings: Secondary | ICD-10-CM

## 2017-12-09 NOTE — Patient Instructions (Addendum)
Keep follow up appointment with Dr. Warrick Parisian Look over Advance Directive packet    [name] , Thank you for taking time to come for your Medicare Wellness Visit. I appreciate your ongoing commitment to your health goals. Please review the following plan we discussed and let me know if I can assist you in the future.   These are the goals we discussed: Eat 3 meals daily that consist of lean proteins, fruits and vegetables  Goals    None      This is a list of the screening recommended for you and due dates:  Health Maintenance  Topic Date Due  . Flu Shot  01/01/2018  . Tetanus Vaccine  08/29/2024  . Pneumonia vaccines  Completed     Advance Directive Advance directives are legal documents that let you make choices ahead of time about your health care and medical treatment in case you become unable to communicate for yourself. Advance directives are a way for you to communicate your wishes to family, friends, and health care providers. This can help convey your decisions about end-of-life care if you become unable to communicate. Discussing and writing advance directives should happen over time rather than all at once. Advance directives can be changed depending on your situation and what you want, even after you have signed the advance directives. If you do not have an advance directive, some states assign family decision makers to act on your behalf based on how closely you are related to them. Each state has its own laws regarding advance directives. You may want to check with your health care provider, attorney, or state representative about the laws in your state. There are different types of advance directives, such as:  Medical power of attorney.  Living will.  Do not resuscitate (DNR) or do not attempt resuscitation (DNAR) order.  Health care proxy and medical power of attorney A health care proxy, also called a health care agent, is a person who is appointed to make  medical decisions for you in cases in which you are unable to make the decisions yourself. Generally, people choose someone they know well and trust to represent their preferences. Make sure to ask this person for an agreement to act as your proxy. A proxy may have to exercise judgment in the event of a medical decision for which your wishes are not known. A medical power of attorney is a legal document that names your health care proxy. Depending on the laws in your state, after the document is written, it may also need to be:  Signed.  Notarized.  Dated.  Copied.  Witnessed.  Incorporated into your medical record.  You may also want to appoint someone to manage your financial affairs in a situation in which you are unable to do so. This is called a durable power of attorney for finances. It is a separate legal document from the durable power of attorney for health care. You may choose the same person or someone different from your health care proxy to act as your agent in financial matters. If you do not appoint a proxy, or if there is a concern that the proxy is not acting in your best interests, a court-appointed guardian may be designated to act on your behalf. Living will A living will is a set of instructions documenting your wishes about medical care when you cannot express them yourself. Health care providers should keep a copy of your living will in your medical record. You  may want to give a copy to family members or friends. To alert caregivers in case of an emergency, you can place a card in your wallet to let them know that you have a living will and where they can find it. A living will is used if you become:  Terminally ill.  Incapacitated.  Unable to communicate or make decisions.  Items to consider in your living will include:  The use or non-use of life-sustaining equipment, such as dialysis machines and breathing machines (ventilators).  A DNR or DNAR order, which is  the instruction not to use cardiopulmonary resuscitation (CPR) if breathing or heartbeat stops.  The use or non-use of tube feeding.  Withholding of food and fluids.  Comfort (palliative) care when the goal becomes comfort rather than a cure.  Organ and tissue donation.  A living will does not give instructions for distributing your money and property if you should pass away. It is recommended that you seek the advice of a lawyer when writing a will. Decisions about taxes, beneficiaries, and asset distribution will be legally binding. This process can relieve your family and friends of any concerns surrounding disputes or questions that may come up about the distribution of your assets. DNR or DNAR A DNR or DNAR order is a request not to have CPR in the event that your heart stops beating or you stop breathing. If a DNR or DNAR order has not been made and shared, a health care provider will try to help any patient whose heart has stopped or who has stopped breathing. If you plan to have surgery, talk with your health care provider about how your DNR or DNAR order will be followed if problems occur. Summary  Advance directives are the legal documents that allow you to make choices ahead of time about your health care and medical treatment in case you become unable to communicate for yourself.  The process of discussing and writing advance directives should happen over time. You can change the advance directives, even after you have signed them.  Advance directives include DNR or DNAR orders, living wills, and designating an agent as your medical power of attorney. This information is not intended to replace advice given to you by your health care provider. Make sure you discuss any questions you have with your health care provider. Document Released: 08/27/2007 Document Revised: 04/08/2016 Document Reviewed: 04/08/2016 Elsevier Interactive Patient Education  2017 [employer].

## 2017-12-09 NOTE — Progress Notes (Signed)
Subjective:   Joseph Byrd is a 84 y.o. male who presents for an Initial Medicare Annual Wellness Visit.  Joseph Byrd previously worked at [employer] for 33 years with his last job at [employer] for 5 years.  He enjoys gardening and walks every morning for at least 30 minutes.  Joseph Byrd lives at home with his wife Judson Roch for 61 years and dog spike.  They have 5 children, 7 grandchildren, and 3 great-grandchildren.  Joseph Byrd does not attend church or any community events.  He has not had any hospitalization or surgeries in the past year and overall feels that his health is about the same as it was a year ago.  Objective:    Today's Vitals   12/09/17 1011  BP: 117/68  Pulse: 83  Weight: 176 lb (79.8 kg)  Height: 5\' 9"  (1.753 m)   Body mass index is 25.99 kg/m.  Advanced Directives 12/15/2016 04/26/2014  Does Patient Have a Medical Advance Directive? No No  Would patient like information on creating a medical advance directive? No - Patient declined No - patient declined information  Information given and copy requested    Current Medications (verified) Outpatient Encounter Medications as of 12/09/2017  Medication Sig  . aspirin 81 MG tablet Take 81 mg by mouth daily.    Joseph Byrd atorvastatin (LIPITOR) 20 MG tablet Take 1 tablet (20 mg total) by mouth daily.  . carvedilol (COREG) 3.125 MG tablet TAKE 1 TABLET (3.125 MG TOTAL) BY MOUTH 2 (TWO) TIMES DAILY WITH A MEAL.  . cilostazol (PLETAL) 100 MG tablet TAKE 1 TABLET BY MOUTH TWICE A DAY  . doxazosin (CARDURA) 8 MG tablet TAKE 1 TABLET (8 MG TOTAL) BY MOUTH AT BEDTIME.  . isosorbide mononitrate (IMDUR) 30 MG 24 hr tablet TAKE 1 TABLET BY MOUTH EVERY DAY  . Multiple Vitamin (MULTIVITAMIN WITH MINERALS) TABS tablet Take 1 tablet by mouth daily.  Joseph Byrd olmesartan-hydrochlorothiazide (BENICAR HCT) 20-12.5 MG tablet Take 1 tablet by mouth daily.  Joseph Byrd DELICA LANCETS 51W MISC CHECK BLOOD GLUCOSE DAILY  . ONETOUCH VERIO test  strip TEST BLOOD SUGAR DAILY DX R73.9  . valsartan (DIOVAN) 320 MG tablet Take 1 tablet (320 mg total) by mouth daily.  . [DISCONTINUED] atorvastatin (LIPITOR) 20 MG tablet TAKE 1 TABLET BY MOUTH EVERY DAY  . [DISCONTINUED] meclizine (ANTIVERT) 25 MG tablet TAKE 2 TABLETS BY MOUTH THREE TIMES DAILY AS NEEDED  . NITROSTAT 0.4 MG SL tablet Place 0.4 mg under the tongue every 5 (five) minutes as needed for chest pain. Reported on 08/15/2015   No facility-administered encounter medications on file as of 12/09/2017.   Up to date.  Has not had to take nitroglycerin recently.  Allergies (verified) Patient has no known allergies.   History: Past Medical History:  Diagnosis Date  . Arthritis    "knees, wrists, knuckles" (04/26/2014)  . CAD (coronary artery disease)   . Headache    "had a couple/wk til ~ 2 wks ago" (04/26/2014)  . Hyperlipidemia   . Hypertension   . PAD (peripheral artery disease) ([place])   . Type II diabetes mellitus ([place])    Past Surgical History:  Procedure Laterality Date  . BACK SURGERY    . CATARACT EXTRACTION W/ INTRAOCULAR LENS  IMPLANT, BILATERAL Bilateral ~ 2014  . CORONARY ANGIOPLASTY WITH STENT PLACEMENT  08/2010; 04/26/2014   "1, LAD; 1"  . LEFT HEART CATHETERIZATION WITH CORONARY ANGIOGRAM N/A 04/26/2014   Procedure: LEFT HEART CATHETERIZATION WITH CORONARY ANGIOGRAM;  Surgeon: Laverda Page, MD;  Location: [facility] CATH LAB;  Service: Cardiovascular;  Laterality: N/A;  . LUMBAR LAMINECTOMY/DECOMPRESSION MICRODISCECTOMY  01/2010   No family history on file. Social History   Socioeconomic History  . Marital status: Married    Spouse name: Not on file  . Number of children: Not on file  . Years of education: Not on file  . Highest education level: Not on file  Occupational History  . Not on file  Social Needs  . Financial resource strain: Not hard at all  . Food insecurity:    Worry: Never true    Inability: Never true  . Transportation needs:    Medical:  No    Non-medical: No  Tobacco Use  . Smoking status: Former Smoker    Packs/day: 0.12    Years: 10.00    Pack years: 1.20    Types: Cigarettes    Last attempt to quit: 09/04/1982    Years since quitting: 35.2  . Smokeless tobacco: Never Used  Substance and Sexual Activity  . Alcohol use: No  . Drug use: No  . Sexual activity: Yes  Lifestyle  . Physical activity:    Days per week: 7 days    Minutes per session: 20 min  . Stress: Not at all  Relationships  . Social connections:    Talks on phone: More than three times a week    Gets together: More than three times a week    Attends religious service: More than 4 times per year    Active member of club or organization: No    Attends meetings of clubs or organizations: Never    Relationship status: Married  Other Topics Concern  . Not on file  Social History Narrative  . Not on file   Tobacco Counseling Counseling given: Not Answered   Clinical Intake:  Pre-visit preparation completed: No  Pain : No/denies pain     Nutritional Status: BMI 25 -29 Overweight Nutritional Risks: None Diabetes: No  How often do you need to have someone help you when you read instructions, pamphlets, or other written materials from your doctor or pharmacy?: 1 - Never  Interpreter Needed?: No  Information entered by :: Truett Mainland, LPN  Activities of Daily Living In your present state of health, do you have any difficulty performing the following activities: 12/09/2017  Hearing? N  Vision? N  Difficulty concentrating or making decisions? N  Walking or climbing stairs? N  Dressing or bathing? N  Doing errands, shopping? N  Some recent data might be hidden  No trouble with ADLs at this time   Immunizations and Health Maintenance Immunization History  Administered Date(s) Administered  . Influenza, High Dose Seasonal PF 04/07/2015, 03/20/2016, 03/24/2017  . Influenza,inj,Quad PF,6+ Mos 03/04/2013, 04/27/2014  . Pneumococcal  Conjugate-13 02/14/2015  . Pneumococcal-Unspecified 11/01/2008  . Tdap 08/30/2014  . Zoster 11/29/2013  Up to date on immunizations, declined shingrix at this time.  There are no preventive care reminders to display for this patient.  Patient Care Team: Dettinger, [name], MD as PCP - General (Family Medicine) Adrian Prows, MD as Attending Physician (Cardiology) [place], [place], [place] (Optometry)  Indicate any recent Medical Services you may have received from other than [facility] providers in the past year (date may be approximate).    Assessment:   This is a routine wellness examination for [place].  Hearing/Vision screen No hearing or vision loss noted Dietary issues and exercise activities discussed: Current Exercise Habits:  Home exercise routine, Time (Minutes): 30, Frequency (Times/Week): 7, Weekly Exercise (Minutes/Week): 210, Intensity: Mild  Goals    None    Eat 3 meals daily that consist of lean proteins, fruits and vegetables   Depression Screen PHQ 2/9 Scores 06/19/2017 12/16/2016 06/17/2016 03/13/2016  PHQ - 2 Score 0 0 0 0    Fall Risk Fall Risk  06/19/2017 12/16/2016 03/13/2016 12/11/2015 09/07/2015  Falls in the past year? No No No No No   No depression or falls noted at this time  Is the patient's home free of loose throw rugs in walkways, pet beds, electrical cords, etc?   yes      Grab bars in the bathroom? yes      Handrails on the stairs?   yes      Adequate lighting?   yes  Timed Get Up and Go performed:  Cognitive Function: MMSE - Mini Mental State Exam 12/09/2017  Orientation to time 5  Orientation to Place 5  Registration 3  Attention/ Calculation 0  Recall 0  Language- name 2 objects 2  Language- repeat 1  Language- follow 3 step command 3  Language- read & follow direction 1  Write a sentence 1  Copy design 1  Total score 22  Some memory loss note Difficulty spelling      Screening Tests Health Maintenance  Topic Date Due  . INFLUENZA VACCINE   01/01/2018  . TETANUS/TDAP  08/29/2024  . PNA vac Low Risk Adult  Completed    Qualifies for Shingles Vaccine? Declined  Cancer Screenings: Lung: Low Dose CT Chest recommended if Age 24-80 years, 30 pack-year currently smoking OR have quit w/in 15years. Patient does not qualify. Colorectal:   Additional Screenings Hepatitis C Screening:       Plan:  Encouraged to eat 3 healthy meals and daily and continue to walk for at least 30 minutes, 3 times weekly.  Encouraged to look over Advance Directive packet and bring signed copy back to office once signed.   Encouraged to keep follow up appointment with Dr. Warrick Parisian.   I have personally reviewed and noted the following in the patient's chart:   . Medical and social history . Use of alcohol, tobacco or illicit drugs  . Current medications and supplements . Functional ability and status . Nutritional status . Physical activity . Advanced directives . List of other physicians . Hospitalizations, surgeries, and ER visits in previous 12 months . Vitals . Screenings to include cognitive, depression, and falls . Referrals and appointments  In addition, I have reviewed and discussed with patient certain preventive protocols, quality metrics, and best practice recommendations. A written personalized care plan for preventive services as well as general preventive health recommendations were provided to patient.     Wardell Heath, LPN   12/05/9161     I have reviewed and agree with the above AWV documentation.   Evelina Dun, FNP

## 2017-12-10 ENCOUNTER — Encounter: Payer: Medicare HMO | Admitting: Family Medicine

## 2017-12-18 ENCOUNTER — Encounter: Payer: Self-pay | Admitting: Family Medicine

## 2017-12-18 ENCOUNTER — Ambulatory Visit (INDEPENDENT_AMBULATORY_CARE_PROVIDER_SITE_OTHER): Payer: Medicare HMO | Admitting: Family Medicine

## 2017-12-18 VITALS — BP 134/83 | HR 72 | Temp 97.6°F | Ht 69.0 in | Wt 173.0 lb

## 2017-12-18 DIAGNOSIS — I10 Essential (primary) hypertension: Secondary | ICD-10-CM

## 2017-12-18 DIAGNOSIS — E785 Hyperlipidemia, unspecified: Secondary | ICD-10-CM | POA: Diagnosis not present

## 2017-12-18 DIAGNOSIS — E663 Overweight: Secondary | ICD-10-CM | POA: Diagnosis not present

## 2017-12-18 NOTE — Progress Notes (Signed)
BP 134/83   Pulse 72   Temp 97.6 F (36.4 C) (Oral)   Ht '5\' 9"'  (1.753 m)   Wt 173 lb (78.5 kg)   BMI 25.55 kg/m    Subjective:    Patient ID: Joseph Byrd, male    DOB: 1933-06-16, 84 y.o.   MRN: [id number]  HPI: Joseph Byrd is a 84 y.o. male presenting on 12/18/2017 for Hyperlipidemia and Hypertension   HPI Hyperlipidemia Patient is coming in for recheck of his hyperlipidemia. The patient is currently taking Lipitor. They deny any issues with myalgias or history of liver damage from it. They deny any focal numbness or weakness or chest pain.   Hypertension Patient is currently on Inderal and doxazosin and Imdur and Benicar HCT, and their blood pressure today is 134/83. Patient denies any lightheadedness or dizziness. Patient denies headaches, blurred vision, chest pains, shortness of breath, or weakness. Denies any side effects from medication and is content with current medication.   Relevant past medical, surgical, family and social history reviewed and updated as indicated. Interim medical history since our last visit reviewed. Allergies and medications reviewed and updated.  Review of Systems  Constitutional: Negative for chills and fever.  Respiratory: Negative for shortness of breath and wheezing.   Cardiovascular: Negative for chest pain and leg swelling.  Musculoskeletal: Negative for back pain and gait problem.  Skin: Negative for rash.  Neurological: Negative for dizziness, weakness, light-headedness and headaches.  All other systems reviewed and are negative.   Per HPI unless specifically indicated above   Allergies as of 12/18/2017   No Known Allergies     Medication List        Accurate as of 12/18/17  9:44 AM. Always use your most recent med list.          aspirin 81 MG tablet Take 81 mg by mouth daily.   atorvastatin 20 MG tablet Commonly known as:  LIPITOR Take 1 tablet (20 mg total) by mouth daily.   carvedilol 3.125 MG  tablet Commonly known as:  COREG TAKE 1 TABLET (3.125 MG TOTAL) BY MOUTH 2 (TWO) TIMES DAILY WITH A MEAL.   cilostazol 100 MG tablet Commonly known as:  PLETAL TAKE 1 TABLET BY MOUTH TWICE A DAY   doxazosin 8 MG tablet Commonly known as:  CARDURA TAKE 1 TABLET (8 MG TOTAL) BY MOUTH AT BEDTIME.   isosorbide mononitrate 30 MG 24 hr tablet Commonly known as:  IMDUR TAKE 1 TABLET BY MOUTH EVERY DAY   multivitamin with minerals Tabs tablet Take 1 tablet by mouth daily.   NITROSTAT 0.4 MG SL tablet Generic drug:  nitroGLYCERIN Place 0.4 mg under the tongue every 5 (five) minutes as needed for chest pain. Reported on 08/15/2015   olmesartan-hydrochlorothiazide 20-12.5 MG tablet Commonly known as:  BENICAR HCT Take 1 tablet by mouth daily.   ONETOUCH DELICA LANCETS 26S Misc CHECK BLOOD GLUCOSE DAILY   ONETOUCH VERIO test strip Generic drug:  glucose blood TEST BLOOD SUGAR DAILY DX R73.9   valsartan 320 MG tablet Commonly known as:  DIOVAN Take 1 tablet (320 mg total) by mouth daily.          Objective:    BP 134/83   Pulse 72   Temp 97.6 F (36.4 C) (Oral)   Ht '5\' 9"'  (1.753 m)   Wt 173 lb (78.5 kg)   BMI 25.55 kg/m   Wt Readings from Last 3 Encounters:  12/18/17 173 lb (78.5 kg)  12/09/17  176 lb (79.8 kg)  06/19/17 176 lb (79.8 kg)    Physical Exam  Constitutional: He is oriented to person, place, and time. He appears well-developed and well-nourished. No distress.  Eyes: Conjunctivae are normal. No scleral icterus.  Neck: Neck supple. No thyromegaly present.  Cardiovascular: Normal rate, regular rhythm, normal heart sounds and intact distal pulses.  No murmur heard. Pulmonary/Chest: Effort normal and breath sounds normal. No respiratory distress. He has no wheezes.  Musculoskeletal: Normal range of motion. He exhibits no edema.  Lymphadenopathy:    He has no cervical adenopathy.  Neurological: He is alert and oriented to person, place, and time. Coordination  normal.  Skin: Skin is warm and dry. No rash noted. He is not diaphoretic.  Psychiatric: He has a normal mood and affect. His behavior is normal.  Nursing note and vitals reviewed.      Assessment & Plan:   Problem List Items Addressed This Visit      Cardiovascular and Mediastinum   Essential hypertension, benign   Relevant Orders   CMP14+EGFR     Other   Hyperlipidemia with target LDL less than 70 - Primary   Relevant Orders   Lipid panel   Overweight (BMI 25.0-29.9)       Follow up plan: Return in about 6 months (around 06/20/2018), or if symptoms worsen or fail to improve, for Hyperlipidemia and hypertension recheck.  Counseling provided for all of the vaccine components Orders Placed This Encounter  Procedures  . CMP14+EGFR  . Lipid panel    Caryl Pina, MD [facility] 12/18/2017, 9:44 AM

## 2017-12-28 ENCOUNTER — Other Ambulatory Visit: Payer: Self-pay | Admitting: Family Medicine

## 2017-12-30 DIAGNOSIS — R69 Illness, unspecified: Secondary | ICD-10-CM | POA: Diagnosis not present

## 2018-01-09 DIAGNOSIS — I25119 Atherosclerotic heart disease of native coronary artery with unspecified angina pectoris: Secondary | ICD-10-CM | POA: Diagnosis not present

## 2018-01-09 DIAGNOSIS — E782 Mixed hyperlipidemia: Secondary | ICD-10-CM | POA: Diagnosis not present

## 2018-01-09 DIAGNOSIS — I10 Essential (primary) hypertension: Secondary | ICD-10-CM | POA: Diagnosis not present

## 2018-01-09 DIAGNOSIS — I70213 Atherosclerosis of native arteries of extremities with intermittent claudication, bilateral legs: Secondary | ICD-10-CM | POA: Diagnosis not present

## 2018-01-18 ENCOUNTER — Encounter: Payer: Self-pay | Admitting: Gastroenterology

## 2018-01-26 DIAGNOSIS — H40013 Open angle with borderline findings, low risk, bilateral: Secondary | ICD-10-CM | POA: Diagnosis not present

## 2018-02-05 ENCOUNTER — Other Ambulatory Visit: Payer: Medicare HMO

## 2018-02-05 ENCOUNTER — Encounter: Payer: Self-pay | Admitting: Family Medicine

## 2018-02-05 DIAGNOSIS — I25119 Atherosclerotic heart disease of native coronary artery with unspecified angina pectoris: Secondary | ICD-10-CM | POA: Diagnosis not present

## 2018-02-05 DIAGNOSIS — R7309 Other abnormal glucose: Secondary | ICD-10-CM | POA: Diagnosis not present

## 2018-02-05 DIAGNOSIS — E782 Mixed hyperlipidemia: Secondary | ICD-10-CM | POA: Diagnosis not present

## 2018-02-09 DIAGNOSIS — I70213 Atherosclerosis of native arteries of extremities with intermittent claudication, bilateral legs: Secondary | ICD-10-CM | POA: Diagnosis not present

## 2018-02-09 DIAGNOSIS — E782 Mixed hyperlipidemia: Secondary | ICD-10-CM | POA: Diagnosis not present

## 2018-02-09 DIAGNOSIS — I10 Essential (primary) hypertension: Secondary | ICD-10-CM | POA: Diagnosis not present

## 2018-02-09 DIAGNOSIS — I25119 Atherosclerotic heart disease of native coronary artery with unspecified angina pectoris: Secondary | ICD-10-CM | POA: Diagnosis not present

## 2018-02-10 ENCOUNTER — Encounter: Payer: Self-pay | Admitting: Gastroenterology

## 2018-02-24 ENCOUNTER — Other Ambulatory Visit: Payer: Self-pay | Admitting: Family Medicine

## 2018-02-24 DIAGNOSIS — I10 Essential (primary) hypertension: Secondary | ICD-10-CM

## 2018-03-19 ENCOUNTER — Encounter (HOSPITAL_COMMUNITY): Payer: Self-pay | Admitting: Emergency Medicine

## 2018-03-19 ENCOUNTER — Observation Stay (HOSPITAL_COMMUNITY)
Admission: EM | Admit: 2018-03-19 | Discharge: 2018-03-20 | Disposition: A | Discharge status: Home / Self Care | Payer: Medicare HMO | Attending: Internal Medicine | Admitting: Internal Medicine

## 2018-03-19 ENCOUNTER — Other Ambulatory Visit: Payer: Self-pay

## 2018-03-19 ENCOUNTER — Ambulatory Visit (INDEPENDENT_AMBULATORY_CARE_PROVIDER_SITE_OTHER): Payer: Medicare HMO | Admitting: Family Medicine

## 2018-03-19 ENCOUNTER — Emergency Department (HOSPITAL_COMMUNITY): Payer: Medicare HMO

## 2018-03-19 ENCOUNTER — Encounter: Payer: Self-pay | Admitting: Family Medicine

## 2018-03-19 VITALS — BP 142/82 | HR 58 | Temp 97.2°F

## 2018-03-19 DIAGNOSIS — E119 Type 2 diabetes mellitus without complications: Secondary | ICD-10-CM

## 2018-03-19 DIAGNOSIS — I739 Peripheral vascular disease, unspecified: Secondary | ICD-10-CM | POA: Diagnosis not present

## 2018-03-19 DIAGNOSIS — E785 Hyperlipidemia, unspecified: Secondary | ICD-10-CM | POA: Diagnosis present

## 2018-03-19 DIAGNOSIS — Z79899 Other long term (current) drug therapy: Secondary | ICD-10-CM | POA: Insufficient documentation

## 2018-03-19 DIAGNOSIS — Z87891 Personal history of nicotine dependence: Secondary | ICD-10-CM | POA: Diagnosis not present

## 2018-03-19 DIAGNOSIS — I209 Angina pectoris, unspecified: Secondary | ICD-10-CM

## 2018-03-19 DIAGNOSIS — Z23 Encounter for immunization: Secondary | ICD-10-CM | POA: Insufficient documentation

## 2018-03-19 DIAGNOSIS — Z9861 Coronary angioplasty status: Secondary | ICD-10-CM

## 2018-03-19 DIAGNOSIS — Z7982 Long term (current) use of aspirin: Secondary | ICD-10-CM | POA: Diagnosis not present

## 2018-03-19 DIAGNOSIS — R079 Chest pain, unspecified: Secondary | ICD-10-CM

## 2018-03-19 DIAGNOSIS — I2510 Atherosclerotic heart disease of native coronary artery without angina pectoris: Secondary | ICD-10-CM | POA: Diagnosis not present

## 2018-03-19 DIAGNOSIS — I10 Essential (primary) hypertension: Secondary | ICD-10-CM | POA: Diagnosis not present

## 2018-03-19 DIAGNOSIS — R001 Bradycardia, unspecified: Secondary | ICD-10-CM | POA: Diagnosis not present

## 2018-03-19 DIAGNOSIS — R0789 Other chest pain: Secondary | ICD-10-CM | POA: Diagnosis not present

## 2018-03-19 LAB — BASIC METABOLIC PANEL
ANION GAP: 6 (ref 5–15)
BUN: 17 mg/dL (ref 8–23)
CHLORIDE: 107 mmol/L (ref 98–111)
CO2: 29 mmol/L (ref 22–32)
Calcium: 8.9 mg/dL (ref 8.9–10.3)
Creatinine, Ser: 0.93 mg/dL (ref 0.61–1.24)
GFR calc Af Amer: >60 mL/min (ref >60)
GFR calc non Af Amer: >60 mL/min (ref >60)
Glucose, Bld: 98 mg/dL (ref 70–99)
POTASSIUM: 4.5 mmol/L (ref 3.5–5.1)
SODIUM: 142 mmol/L (ref 135–145)

## 2018-03-19 LAB — TROPONIN I: Troponin I: <0.03 ng/mL (ref <0.03)

## 2018-03-19 LAB — CBC
HEMATOCRIT: 39.8 % (ref 39.0–52.0)
HEMOGLOBIN: 12.3 g/dL — AB (ref 13.0–17.0)
MCH: 30.7 pg (ref 26.0–34.0)
MCHC: 30.9 g/dL (ref 30.0–36.0)
MCV: 99.3 fL (ref 80.0–100.0)
Platelets: 511 10*3/uL — ABNORMAL HIGH (ref 150–400)
RBC: 4.01 MIL/uL — AB (ref 4.22–5.81)
RDW: 13 % (ref 11.5–15.5)
WBC: 5 10*3/uL (ref 4.0–10.5)
nRBC: 0 % (ref 0.0–0.2)

## 2018-03-19 LAB — HEMOGLOBIN A1C
Hgb A1c MFr Bld: 4.5 % — ABNORMAL LOW (ref 4.8–5.6)
Mean Plasma Glucose: 82.45 mg/dL

## 2018-03-19 LAB — GLUCOSE, CAPILLARY: Glucose-Capillary: 100 mg/dL — ABNORMAL HIGH (ref 70–99)

## 2018-03-19 LAB — TSH: TSH: 1.042 u[IU]/mL (ref 0.350–4.500)

## 2018-03-19 LAB — PROTIME-INR
INR: 1.15
PROTHROMBIN TIME: 14.6 s (ref 11.4–15.2)

## 2018-03-19 MED ORDER — ATORVASTATIN CALCIUM 20 MG PO TABS
20.0000 mg | ORAL_TABLET | Freq: Every day | ORAL | Status: DC
  Administered 2018-03-19: 20 mg via ORAL
  Filled 2018-03-19: qty 1

## 2018-03-19 MED ORDER — ISOSORBIDE MONONITRATE ER 60 MG PO TB24
30.0000 mg | ORAL_TABLET | Freq: Every day | ORAL | Status: DC
  Administered 2018-03-20: 30 mg via ORAL
  Filled 2018-03-19: qty 1

## 2018-03-19 MED ORDER — MORPHINE SULFATE (PF) 4 MG/ML IV SOLN: 4.0000 mg | INTRAVENOUS | Status: DC | PRN

## 2018-03-19 MED ORDER — HYDROCHLOROTHIAZIDE 12.5 MG PO CAPS
12.5000 mg | ORAL_CAPSULE | Freq: Every day | ORAL | Status: DC
  Administered 2018-03-20: 12.5 mg via ORAL
  Filled 2018-03-19: qty 1

## 2018-03-19 MED ORDER — OLMESARTAN MEDOXOMIL-HCTZ 20-12.5 MG PO TABS: 1.0000 | ORAL_TABLET | Freq: Every day | ORAL | Status: DC

## 2018-03-19 MED ORDER — PANTOPRAZOLE SODIUM 40 MG PO TBEC: 40.0000 mg | DELAYED_RELEASE_TABLET | Freq: Every day | ORAL | Status: DC

## 2018-03-19 MED ORDER — ACETAMINOPHEN 325 MG PO TABS: 650.0000 mg | ORAL_TABLET | ORAL | Status: DC | PRN

## 2018-03-19 MED ORDER — CILOSTAZOL 100 MG PO TABS
100.0000 mg | ORAL_TABLET | Freq: Two times a day (BID) | ORAL | Status: DC
  Administered 2018-03-19 – 2018-03-20 (×2): 100 mg via ORAL
  Filled 2018-03-19 (×2): qty 1

## 2018-03-19 MED ORDER — DOXAZOSIN MESYLATE 2 MG PO TABS: 8.0000 mg | ORAL_TABLET | Freq: Every day | ORAL | Status: DC

## 2018-03-19 MED ORDER — INFLUENZA VAC SPLIT HIGH-DOSE 0.5 ML IM SUSY
0.5000 mL | PREFILLED_SYRINGE | INTRAMUSCULAR | Status: AC
  Administered 2018-03-20: 0.5 mL via INTRAMUSCULAR
  Filled 2018-03-19: qty 0.5

## 2018-03-19 MED ORDER — ENOXAPARIN SODIUM 40 MG/0.4ML ~~LOC~~ SOLN
40.0000 mg | SUBCUTANEOUS | Status: DC
  Administered 2018-03-19: 40 mg via SUBCUTANEOUS
  Filled 2018-03-19: qty 0.4

## 2018-03-19 MED ORDER — TRAZODONE HCL 50 MG PO TABS: 25.0000 mg | ORAL_TABLET | Freq: Every evening | ORAL | Status: DC | PRN

## 2018-03-19 MED ORDER — ONDANSETRON HCL 4 MG/2ML IJ SOLN: 4.0000 mg | Freq: Four times a day (QID) | INTRAMUSCULAR | Status: DC | PRN

## 2018-03-19 MED ORDER — INSULIN ASPART 100 UNIT/ML ~~LOC~~ SOLN: 0.0000 [IU] | Freq: Three times a day (TID) | SUBCUTANEOUS | Status: DC

## 2018-03-19 MED ORDER — ASPIRIN 81 MG PO CHEW
324.0000 mg | CHEWABLE_TABLET | Freq: Once | ORAL | Status: AC
  Administered 2018-03-19: 324 mg via ORAL
  Filled 2018-03-19: qty 4

## 2018-03-19 MED ORDER — NITROGLYCERIN 0.4 MG SL SUBL: 0.4000 mg | SUBLINGUAL_TABLET | SUBLINGUAL | Status: DC | PRN

## 2018-03-19 MED ORDER — IRBESARTAN 150 MG PO TABS
150.0000 mg | ORAL_TABLET | Freq: Every day | ORAL | Status: DC
  Administered 2018-03-20: 150 mg via ORAL
  Filled 2018-03-19: qty 1

## 2018-03-19 MED ORDER — ASPIRIN 81 MG PO CHEW
81.0000 mg | CHEWABLE_TABLET | Freq: Every day | ORAL | Status: DC
  Administered 2018-03-20: 81 mg via ORAL
  Filled 2018-03-19: qty 1

## 2018-03-19 NOTE — H&P (Signed)
History and Physical  Joseph Byrd CBU:[id number] DOB: [birth year] DOA: 03/19/2018  Referring physician: Thurnell Byrd PCP: Dettinger, [name], MD   Chief Complaint: chest pain   HPI: Joseph Byrd Age is a 84 y.o. male with known coronary artery disease who presented to his primary care physician earlier today complaining of sudden onset of chest pain earlier this morning around 6 AM that was not associated with exertion.  He describes his pain is left-sided chest pain that radiated and was relieved by taking 2 nitroglycerin tablets.  He also had some shortness of breath during the episode but it has completely resolved at this time.  His chest pain has also completely resolved.  The patient denies nausea vomiting diarrhea and dizziness.  He has had 2 stents placed by his cardiologist Dr. [name] that he last saw in September.  His primary care physician was concerned because he had some EKG changes in lead III and aVF.  He has some flattening T waves as well.  He was sent to the emergency department for further evaluation.  He was also noted to have a heart score of 6.  In the emergency department he was evaluated and noted to have a troponin less than 0.03.  He did not require pain medication on arrival.  He was given aspirin and a cardiology consultation was requested.  The medicine service is admitting him for further observation and ongoing management.  Review of Systems: All systems reviewed and apart from history of presenting illness, are negative.  Past Medical History:  Diagnosis Date  . Arthritis    "knees, wrists, knuckles" (04/26/2014)  . CAD (coronary artery disease)   . Headache    "had a couple/wk til ~ 2 wks ago" (04/26/2014)  . Hyperlipidemia   . Hypertension   . PAD (peripheral artery disease) ([place])   . Type II diabetes mellitus ([place])    Past Surgical History:  Procedure Laterality Date  . BACK SURGERY    . CATARACT EXTRACTION W/ INTRAOCULAR LENS  IMPLANT, BILATERAL  Bilateral ~ 2014  . CORONARY ANGIOPLASTY WITH STENT PLACEMENT  08/2010; 04/26/2014   "1, LAD; 1"  . LEFT HEART CATHETERIZATION WITH CORONARY ANGIOGRAM N/A 04/26/2014   Procedure: LEFT HEART CATHETERIZATION WITH CORONARY ANGIOGRAM;  Surgeon: Laverda Page, MD;  Location: [facility] CATH LAB;  Service: Cardiovascular;  Laterality: N/A;  . LUMBAR LAMINECTOMY/DECOMPRESSION MICRODISCECTOMY  01/2010   Social History:  reports that he quit smoking about 35 years ago. His smoking use included cigarettes. He has a 1.20 pack-year smoking history. He has never used smokeless tobacco. He reports that he does not drink alcohol or use drugs.  No Known Allergies  History reviewed. No pertinent family history.  Prior to Admission medications   Medication Sig Start Date End Date Taking? Authorizing Provider  aspirin 81 MG tablet Take 81 mg by mouth daily.     Yes [provider]  atorvastatin (LIPITOR) 20 MG tablet Take 1 tablet (20 mg total) by mouth daily. 05/29/17  Yes Dettinger, [name], MD  carvedilol (COREG) 3.125 MG tablet TAKE 1 TABLET (3.125 MG TOTAL) BY MOUTH 2 (TWO) TIMES DAILY WITH A MEAL. 02/24/18  Yes Dettinger, [name], MD  cilostazol (PLETAL) 100 MG tablet TAKE 1 TABLET BY MOUTH TWICE A DAY 02/24/18  Yes Dettinger, [name], MD  doxazosin (CARDURA) 8 MG tablet TAKE 1 TABLET (8 MG TOTAL) BY MOUTH AT BEDTIME. 11/19/17  Yes Dettinger, [name], MD  isosorbide mononitrate (IMDUR) 30 MG 24  hr tablet TAKE 1 TABLET BY MOUTH EVERY DAY 02/24/18  Yes Dettinger, [name], MD  Multiple Vitamin (MULTIVITAMIN WITH MINERALS) TABS tablet Take 1 tablet by mouth daily.   Yes [provider]  NITROSTAT 0.4 MG SL tablet Place 0.4 mg under the tongue every 5 (five) minutes as needed for chest pain. Reported on 08/15/2015 04/14/14  Yes [provider]  olmesartan-hydrochlorothiazide (BENICAR HCT) 20-12.5 MG tablet Take 1 tablet by mouth daily. 11/11/17  Yes Dettinger, [name], MD  [facility] DELICA  LANCETS 18E MISC CHECK BLOOD GLUCOSE DAILY 02/28/17  Yes Dettinger, [name], MD  [facility] VERIO test strip TEST BLOOD SUGAR DAILY DX E11.9 12/29/17  Yes Dettinger, [name], MD   Physical Exam: Vitals:   03/19/18 1200 03/19/18 1230 03/19/18 1300 03/19/18 1330  BP: 127/74 (!) 147/77 (!) 142/88 (!) 141/77  Pulse: (!) 45 (!) 45 (!) 46 (!) 44  Resp: 12     Temp:      TempSrc:      SpO2:      Weight:      Height:        General exam: Well appearing elderly male. Moderately built and nourished patient, lying comfortably supine on the gurney in no obvious distress.  Head, eyes and ENT: Nontraumatic and normocephalic. Pupils equally reacting to light and accommodation. Oral mucosa moist.  Neck: Supple. No JVD, carotid bruit or thyromegaly.  Lymphatics: No lymphadenopathy.  Respiratory system: Clear to auscultation. No increased work of breathing.  Cardiovascular system: S1 and S2 heard, sinus bradycardia noted.   Gastrointestinal system: Abdomen is nondistended, soft and nontender. Normal bowel sounds heard. No organomegaly or masses appreciated.  Central nervous system: Alert and oriented. No focal neurological deficits.  Extremities: Symmetric 5 x 5 power. Peripheral pulses symmetrically felt.   Skin: No rashes or acute findings.  Musculoskeletal system: Negative exam.  Psychiatry: Pleasant and cooperative.  Labs on Admission:  Basic Metabolic Panel: Recent Labs  Lab 03/19/18 1230  NA 142  K 4.5  CL 107  CO2 29  GLUCOSE 98  BUN 17  CREATININE 0.93  CALCIUM 8.9   Liver Function Tests: No results for input(s): AST, ALT, ALKPHOS, BILITOT, PROT, ALBUMIN in the last 168 hours. No results for input(s): LIPASE, AMYLASE in the last 168 hours. No results for input(s): AMMONIA in the last 168 hours. CBC: Recent Labs  Lab 03/19/18 1230  WBC 5.0  HGB 12.3*  HCT 39.8  MCV 99.3  PLT 511*   Cardiac Enzymes: Recent Labs  Lab 03/19/18 1230  TROPONINI <0.03    BNP  (last 3 results) No results for input(s): PROBNP in the last 8760 hours. CBG: No results for input(s): GLUCAP in the last 168 hours.  Radiological Exams on Admission: Dg Chest 2 View  Result Date: 03/19/2018 CLINICAL DATA:  Onset chest pain today. EXAM: CHEST - 2 VIEW COMPARISON:  PA and lateral chest 01/17/2010. FINDINGS: The chest is mildly hyperexpanded. Lungs are clear. No pneumothorax or pleural fluid. Heart size is normal. No acute or focal bony abnormality. IMPRESSION: No acute disease. Pulmonary hyperexpansion suggestive of COPD. Electronically Signed   By: [name] M.D.   On: 03/19/2018 12:27    EKG: Independently reviewed. Sinus bradycardia  Assessment/Plan Principal Problem:   Chest pain Active Problems:   Essential hypertension, benign   CAD, multiple vessel   Hyperlipidemia with target LDL less than 70   PVD (peripheral vascular disease) with claudication (HCC)   Postsurgical percutaneous transluminal  coronary angioplasty (PTCA) status   Type II diabetes mellitus ([place])   1. Chest pain with CAD and Heart Score of 6 -admit to telemetry for observation.  Cycle troponins to rule out acute myocardial ischemia.  Monitor EKG and telemetry for any acute change in symptoms.  N.p.o. after midnight in case further ischemic testing is required.  Cardiology team has been consulted and will follow.  Per cardiology consult would start heparin or Lovenox only in the event patient develops recurrent chest pain symptoms.  Continue aspirin, atorvastatin, Imdur.  Carvedilol is being held due to bradycardia. 2. Type 2 diabetes mellitus-we will provide supplemental sliding scale insulin coverage, check hemoglobin A1c.  Carbohydrate modified diet recommended. 3. Sinus bradycardia- holding carvedilol and monitor on telemetry. 4. PVD with claudication -resume home medications.  5. Dyslipidemia - resume home atorvastatin.  Check fasting lipids.   DVT Prophylaxis: lovenox  Code Status:  Full   Family Communication: updated patient and wife at bedside   Disposition Plan: observation, telemetry   Time spent: 56 minutes  Irwin Brakeman, MD [facility] Pager [phone]  If 7PM-7AM, please contact night-coverage [email or web address] Password [email or web address] 03/19/2018, 2:12 PM

## 2018-03-19 NOTE — ED Notes (Signed)
ED TO INPATIENT HANDOFF REPORT  Name/Age/Gender Joseph Byrd 84 y.o. male  Code Status Code Status History    Date Active Date Inactive Code Status Order ID Comments User Context   04/26/2014 1327 04/27/2014 1404 Full Code [id number]  Adrian Prows, MD Inpatient      Home/SNF/Other Home  Chief Complaint CP EARLIER  Level of Care/Admitting Diagnosis ED Disposition    ED Disposition Condition [employer]: [facility] [[id number]]  Level of Care: Telemetry [5]  Diagnosis: Chest pain [092330]  Admitting Physician: Murlean Iba [[id number]]  Attending Physician: Murlean Iba [[id number]]  PT Class (Do Not Modify): Observation [104]  PT Acc Code (Do Not Modify): Observation [10022]       Medical History Past Medical History:  Diagnosis Date  . Arthritis    "knees, wrists, knuckles" (04/26/2014)  . CAD (coronary artery disease)   . Headache    "had a couple/wk til ~ 2 wks ago" (04/26/2014)  . Hyperlipidemia   . Hypertension   . PAD (peripheral artery disease) ([place])   . Type II diabetes mellitus (HCC)     Allergies No Known Allergies  IV Location/Drains/Wounds Patient Lines/Drains/Airways Status   Active Line/Drains/Airways    Name:   Placement date:   Placement time:   Site:   Days:   Peripheral IV 03/19/18 Right Forearm   03/19/18    1122    Forearm   less than 1          Labs/Imaging Results for orders placed or performed during the hospital encounter of 03/19/18 (from the past 48 hour(s))  Basic metabolic panel     Status: None   Collection Time: 03/19/18 12:30 PM  Result Value Ref Range   Sodium 142 135 - 145 mmol/L   Potassium 4.5 3.5 - 5.1 mmol/L   Chloride 107 98 - 111 mmol/L   CO2 29 22 - 32 mmol/L   Glucose, Bld 98 70 - 99 mg/dL   BUN 17 8 - 23 mg/dL   Creatinine, Ser 0.93 0.61 - 1.24 mg/dL   Calcium 8.9 8.9 - 10.3 mg/dL   GFR calc non Af Amer >60 >60 mL/min   GFR calc Af Amer >60 >60 mL/min    Comment:  (NOTE) The eGFR has been calculated using the CKD EPI equation. This calculation has not been validated in all clinical situations. eGFR's persistently <60 mL/min signify possible Chronic Kidney Disease.    Anion gap 6 5 - 15    Comment: Performed at [facility], [address] [place], [place] [address]  CBC     Status: Abnormal   Collection Time: 03/19/18 12:30 PM  Result Value Ref Range   WBC 5.0 4.0 - 10.5 K/uL   RBC 4.01 (L) 4.22 - 5.81 MIL/uL   Hemoglobin 12.3 (L) 13.0 - 17.0 g/dL   HCT 39.8 39.0 - 52.0 %   MCV 99.3 80.0 - 100.0 fL   MCH 30.7 26.0 - 34.0 pg   MCHC 30.9 30.0 - 36.0 g/dL   RDW 13.0 11.5 - 15.5 %   Platelets 511 (H) 150 - 400 K/uL   nRBC 0.0 0.0 - 0.2 %    Comment: Performed at [facility], [address] [place],  [address]  Troponin I     Status: None   Collection Time: 03/19/18 12:30 PM  Result Value Ref Range   Troponin I <0.03 <0.03 ng/mL    Comment: Performed at [facility],  [address] [place], [place] [address]  Protime-INR (order if Patient is taking Coumadin / Warfarin)     Status: None   Collection Time: 03/19/18 12:30 PM  Result Value Ref Range   Prothrombin Time 14.6 11.4 - 15.2 seconds   INR 1.15     Comment: Performed at [facility], [address] [place], [place] [address]  TSH     Status: None   Collection Time: 03/19/18 12:30 PM  Result Value Ref Range   TSH 1.042 0.350 - 4.500 uIU/mL    Comment: Performed by a 3rd Generation assay with a functional sensitivity of <=0.01 uIU/mL. Performed at [facility], [address] [place], [place] [address]    Dg Chest 2 View  Result Date: 03/19/2018 CLINICAL DATA:  Onset chest pain today. EXAM: CHEST - 2 VIEW COMPARISON:  PA and lateral chest 01/17/2010. FINDINGS: The chest is mildly hyperexpanded. Lungs are clear. No pneumothorax or pleural fluid. Heart size is normal. No acute or focal bony abnormality. IMPRESSION: No acute disease. Pulmonary hyperexpansion suggestive of COPD.  Electronically Signed   By: [name] M.D.   On: 03/19/2018 12:27    Pending Labs [employer] (From admission, onward)    Start     Ordered   Signed and Held  Troponin I-serum (0, 3, 6 hours)  Now then every 3 hours,   TIMED     Signed and Held   Signed and Held  CBC  (enoxaparin (LOVENOX)    CrCl >/= 30 ml/min)  Once,   R    Comments:  Baseline for enoxaparin therapy IF NOT ALREADY DRAWN.  Notify MD if PLT < 100 K.    Signed and Held   Signed and Held  Creatinine, serum  (enoxaparin (LOVENOX)    CrCl >/= 30 ml/min)  Once,   R    Comments:  Baseline for enoxaparin therapy IF NOT ALREADY DRAWN.    Signed and Held   Signed and Held  Creatinine, serum  (enoxaparin (LOVENOX)    CrCl >/= 30 ml/min)  Weekly,   R    Comments:  while on enoxaparin therapy    Signed and Held   Signed and Held  Hemoglobin A1c  Add-on,   R    Comments:  To assess prior glycemic control    Signed and Held   Signed and Held  Lipid panel  Tomorrow morning,   R     Signed and Held   Signed and Held  TSH  Add-on,   STAT     Signed and Held          Vitals/Pain Today's Vitals   03/19/18 1330 03/19/18 1400 03/19/18 1430 03/19/18 1721  BP: (!) 141/77 (!) 149/72 (!) 144/78 (!) 174/93  Pulse: (!) 44 (!) 43 (!) 48 (!) 57  Resp:    17  Temp:      TempSrc:      SpO2:    97%  Weight:      Height:      PainSc:        Isolation Precautions No active isolations  Medications Medications  morphine 4 MG/ML injection 4 mg (has no administration in time range)  nitroGLYCERIN (NITROSTAT) SL tablet 0.4 mg (has no administration in time range)  aspirin chewable tablet 324 mg (324 mg Oral Given 03/19/18 1148)    Mobility walks

## 2018-03-19 NOTE — ED Triage Notes (Signed)
Per EMS, pt sent over by [place] for CP earlier this morning. Reports LT sided, non-radiating CP that was relieved by nitro. Reported that pain returned and he took a second nitro with relief. No pain since second nitro. Pt went to PCP to be evaluated and they sent him here for some EKG changes since last one in 2015. Cardiac hx with stent placement. Denies any pain at this time. Sinus brady on monitor.

## 2018-03-19 NOTE — ED Provider Notes (Signed)
[facility] EMERGENCY DEPARTMENT Provider Note   CSN: [id number] Arrival date & time: 03/19/18  1113     History   Chief Complaint Chief Complaint  Patient presents with  . Chest Pain    HPI Joseph Byrd is a 84 y.o. male.  HPI Pt was seen at 1135. Per pt, c/o gradual onset and resolution of 2 separate episodes of chest discomfort that occurred this morning. Pt states he woke up with mid-sternal chest "heaviness" approximately 0600 this morning. Was associated with SOB. Pt states his symptoms lasted approximately 15 minutes and resolved after he took one of his own SL ntg. Pt states the chest "heaviness" and SOB recurred again after that. Symptoms again lasted approximately 15 minutes and resolved after he took one of his own SL ntg. Pt was evaluated by his PMD, then sent to the ED for further evaluation due to "EKG changes" seen in the office. Denies palpitations, no cough, no back pain, no N/V/D, no fevers, no rash, no injury.    Past Medical History:  Diagnosis Date  . Arthritis    "knees, wrists, knuckles" (04/26/2014)  . CAD (coronary artery disease)   . Headache    "had a couple/wk til ~ 2 wks ago" (04/26/2014)  . Hyperlipidemia   . Hypertension   . PAD (peripheral artery disease) ([place])   . Type II diabetes mellitus [facility])     Patient Active Problem List   Diagnosis Date Noted  . Overweight (BMI 25.0-29.9) 12/18/2017  . Anemia 12/16/2016  . Vitamin D deficiency 08/30/2014  . Postsurgical percutaneous transluminal coronary angioplasty (PTCA) status 04/26/2014  . Cataract   . Arthritis of both knees 01/05/2013  . Essential hypertension, benign 08/22/2010  . CAD, multiple vessel 08/22/2010  . Hyperlipidemia with target LDL less than 70 08/22/2010  . PVD (peripheral vascular disease) with claudication ([place]) 08/22/2010    Past Surgical History:  Procedure Laterality Date  . BACK SURGERY    . CATARACT EXTRACTION W/ INTRAOCULAR LENS  IMPLANT, BILATERAL  Bilateral ~ 2014  . CORONARY ANGIOPLASTY WITH STENT PLACEMENT  08/2010; 04/26/2014   "1, LAD; 1"  . LEFT HEART CATHETERIZATION WITH CORONARY ANGIOGRAM N/A 04/26/2014   Procedure: LEFT HEART CATHETERIZATION WITH CORONARY ANGIOGRAM;  Surgeon: Laverda Page, MD;  Location: [facility] CATH LAB;  Service: Cardiovascular;  Laterality: N/A;  . LUMBAR LAMINECTOMY/DECOMPRESSION MICRODISCECTOMY  01/2010        Home Medications    Prior to Admission medications   Medication Sig Start Date End Date Taking? Authorizing Provider  aspirin 81 MG tablet Take 81 mg by mouth daily.     Yes [provider]  atorvastatin (LIPITOR) 20 MG tablet Take 1 tablet (20 mg total) by mouth daily. 05/29/17  Yes Dettinger, [name], MD  carvedilol (COREG) 3.125 MG tablet TAKE 1 TABLET (3.125 MG TOTAL) BY MOUTH 2 (TWO) TIMES DAILY WITH A MEAL. 02/24/18  Yes Dettinger, [name], MD  cilostazol (PLETAL) 100 MG tablet TAKE 1 TABLET BY MOUTH TWICE A DAY 02/24/18  Yes Dettinger, [name], MD  doxazosin (CARDURA) 8 MG tablet TAKE 1 TABLET (8 MG TOTAL) BY MOUTH AT BEDTIME. 11/19/17  Yes Dettinger, [name], MD  isosorbide mononitrate (IMDUR) 30 MG 24 hr tablet TAKE 1 TABLET BY MOUTH EVERY DAY 02/24/18  Yes Dettinger, [name], MD  Multiple Vitamin (MULTIVITAMIN WITH MINERALS) TABS tablet Take 1 tablet by mouth daily.   Yes [provider]  NITROSTAT 0.4 MG SL tablet Place 0.4 mg under  the tongue every 5 (five) minutes as needed for chest pain. Reported on 08/15/2015 04/14/14  Yes [provider]  olmesartan-hydrochlorothiazide (BENICAR HCT) 20-12.5 MG tablet Take 1 tablet by mouth daily. 11/11/17  Yes Dettinger, [name], MD  [facility] DELICA LANCETS 62Z MISC CHECK BLOOD GLUCOSE DAILY 02/28/17  Yes Dettinger, [name], MD  [facility] VERIO test strip TEST BLOOD SUGAR DAILY DX E11.9 12/29/17  Yes Dettinger, [name], MD    Family History No family history on file.  Social History Social History   Tobacco Use  .  Smoking status: Former Smoker    Packs/day: 0.12    Years: 10.00    Pack years: 1.20    Types: Cigarettes    Last attempt to quit: 09/04/1982    Years since quitting: 35.5  . Smokeless tobacco: Never Used  Substance Use Topics  . Alcohol use: No  . Drug use: No     Allergies   Patient has no known allergies.   Review of Systems Review of Systems ROS: Statement: All systems negative except as marked or noted in the HPI; Constitutional: Negative for fever and chills. ; ; Eyes: Negative for eye pain, redness and discharge. ; ; ENMT: Negative for ear pain, hoarseness, nasal congestion, sinus pressure and sore throat. ; ; Cardiovascular: +CP, SOB. Negative for palpitations, diaphoresis, and peripheral edema. ; ; Respiratory: Negative for cough, wheezing and stridor. ; ; Gastrointestinal: Negative for nausea, vomiting, diarrhea, abdominal pain, blood in stool, hematemesis, jaundice and rectal bleeding. . ; ; Genitourinary: Negative for dysuria, flank pain and hematuria. ; ; Musculoskeletal: Negative for back pain and neck pain. Negative for swelling and trauma.; ; Skin: Negative for pruritus, rash, abrasions, blisters, bruising and skin lesion.; ; Neuro: Negative for headache, lightheadedness and neck stiffness. Negative for weakness, altered level of consciousness, altered mental status, extremity weakness, paresthesias, involuntary movement, seizure and syncope.       Physical Exam Updated Vital Signs BP (!) 146/78   Pulse (!) 43   Temp 98.3 F (36.8 C) (Oral)   Resp 19   Ht 5\' 9"  (1.753 m)   Wt 80.7 kg   SpO2 100%   BMI 26.29 kg/m   Physical Exam 1140: Physical examination:  Nursing notes reviewed; Vital signs and O2 SAT reviewed;  Constitutional: Well developed, Well nourished, Well hydrated, In no acute distress; Head:  Normocephalic, atraumatic; Eyes: EOMI, PERRL, No scleral icterus; ENMT: Mouth and pharynx normal, Mucous membranes moist; Neck: Supple, Full range of motion, No  lymphadenopathy; Cardiovascular: Bradycardic rate and rhythm, No gallop; Respiratory: Breath sounds clear & equal bilaterally, No wheezes.  Speaking full sentences with ease, Normal respiratory effort/excursion; Chest: Nontender, Movement normal; Abdomen: Soft, Nontender, Nondistended, Normal bowel sounds; Genitourinary: No CVA tenderness; Extremities: Peripheral pulses normal, No tenderness, No edema, No calf edema or asymmetry.; Neuro: AA&Ox3, Major CN grossly intact.  Speech clear. No gross focal motor or sensory deficits in extremities.; Skin: Color normal, Warm, Dry.   ED Treatments / Results  Labs (all labs ordered are listed, but only abnormal results are displayed)   EKG EKG Interpretation  Date/Time:  Thursday March 19 2018 11:25:22 EDT Ventricular Rate:  47 PR Interval:    QRS Duration: 111 QT Interval:  448 QTC Calculation: 397 R Axis:   2 Text Interpretation:  Sinus bradycardia Abnormal R-wave progression, early transition When compared with ECG of 04/27/2014 Right bundle branch block is no longer Present Confirmed by Francine Graven [phone]) on 03/19/2018 11:43:26 AM  Radiology   Procedures Procedures (including critical care time)  Medications Ordered in ED Medications  morphine 4 MG/ML injection 4 mg (has no administration in time range)  nitroGLYCERIN (NITROSTAT) SL tablet 0.4 mg (has no administration in time range)  aspirin chewable tablet 324 mg (324 mg Oral Given 03/19/18 1148)     Initial Impression / Assessment and Plan / ED Course  I have reviewed the triage vital signs and the nursing notes.  Pertinent labs & imaging results that were available during my care of the patient were reviewed by me and considered in my medical decision making (see chart for details).  MDM Reviewed: previous chart, nursing note and vitals Reviewed previous: labs and ECG Interpretation: labs, ECG and x-ray   Results for orders placed or performed during the hospital  encounter of 21/30/86  Basic metabolic panel  Result Value Ref Range   Sodium 142 135 - 145 mmol/L   Potassium 4.5 3.5 - 5.1 mmol/L   Chloride 107 98 - 111 mmol/L   CO2 29 22 - 32 mmol/L   Glucose, Bld 98 70 - 99 mg/dL   BUN 17 8 - 23 mg/dL   Creatinine, Ser 0.93 0.61 - 1.24 mg/dL   Calcium 8.9 8.9 - 10.3 mg/dL   GFR calc non Af Amer >60 >60 mL/min   GFR calc Af Amer >60 >60 mL/min   Anion gap 6 5 - 15  CBC  Result Value Ref Range   WBC 5.0 4.0 - 10.5 K/uL   RBC 4.01 (L) 4.22 - 5.81 MIL/uL   Hemoglobin 12.3 (L) 13.0 - 17.0 g/dL   HCT 39.8 39.0 - 52.0 %   MCV 99.3 80.0 - 100.0 fL   MCH 30.7 26.0 - 34.0 pg   MCHC 30.9 30.0 - 36.0 g/dL   RDW 13.0 11.5 - 15.5 %   Platelets 511 (H) 150 - 400 K/uL   nRBC 0.0 0.0 - 0.2 %  Troponin I  Result Value Ref Range   Troponin I <0.03 <0.03 ng/mL  Protime-INR (order if Patient is taking Coumadin / Warfarin)  Result Value Ref Range   Prothrombin Time 14.6 11.4 - 15.2 seconds   INR 1.15    Dg Chest 2 View Result Date: 03/19/2018 CLINICAL DATA:  Onset chest pain today. EXAM: CHEST - 2 VIEW COMPARISON:  PA and lateral chest 01/17/2010. FINDINGS: The chest is mildly hyperexpanded. Lungs are clear. No pneumothorax or pleural fluid. Heart size is normal. No acute or focal bony abnormality. IMPRESSION: No acute disease. Pulmonary hyperexpansion suggestive of COPD. Electronically Signed   By: [name] M.D.   On: 03/19/2018 12:27    1345:  ASA given. No further symptoms while in the ED. Heart score 6-7; will admit. T/C returned from [facility] Dr. Harl Bowie, case discussed, including:  HPI, pertinent PM/SHx, VS/PE, dx testing, ED course and treatment:  Agreeable to consult and +/- order lovenox/heparin.   1400:  T/C returned from [facility] Dr. Wynetta Emery, case discussed, including:  HPI, pertinent PM/SHx, VS/PE, dx testing, ED course and treatment, as well as d/w Cards MD:  Agreeable to admit.      Final Clinical Impressions(s) / ED Diagnoses    Final diagnoses:  None    ED Discharge Orders    None       Francine Graven, DO 03/21/18 5784

## 2018-03-19 NOTE — Progress Notes (Signed)
Subjective: CC: Chest pain PCP: Dettinger, [name], MD [id number] Joseph Byrd is a 84 y.o. male presenting to clinic today for:  1. Chest pain Patient with sudden onset of chest pain this morning around 6 AM.  This was not with exertion.  He points to the left side of his chest and states that it was relieved after 2 tablets of nitroglycerin.  He reports associated shortness of breath that is since resolved.  He is currently chest pain-free.  No nausea, vomiting, diaphoresis, dizziness.  Past medical history is significant for coronary artery disease and he has 2 stents in place.  He sees Dr. [name] with last visit in September.   ROS: Per HPI  No Known Allergies Past Medical History:  Diagnosis Date  . Arthritis    "knees, wrists, knuckles" (04/26/2014)  . CAD (coronary artery disease)   . Headache    "had a couple/wk til ~ 2 wks ago" (04/26/2014)  . Hyperlipidemia   . Hypertension   . PAD (peripheral artery disease) ([place])   . Type II diabetes mellitus (HCC)     Current Outpatient Medications:  .  aspirin 81 MG tablet, Take 81 mg by mouth daily.  , Disp: , Rfl:  .  atorvastatin (LIPITOR) 20 MG tablet, Take 1 tablet (20 mg total) by mouth daily., Disp: 90 tablet, Rfl: 0 .  carvedilol (COREG) 3.125 MG tablet, TAKE 1 TABLET (3.125 MG TOTAL) BY MOUTH 2 (TWO) TIMES DAILY WITH A MEAL., Disp: 180 tablet, Rfl: 0 .  cilostazol (PLETAL) 100 MG tablet, TAKE 1 TABLET BY MOUTH TWICE A DAY, Disp: 180 tablet, Rfl: 0 .  doxazosin (CARDURA) 8 MG tablet, TAKE 1 TABLET (8 MG TOTAL) BY MOUTH AT BEDTIME., Disp: 90 tablet, Rfl: 0 .  isosorbide mononitrate (IMDUR) 30 MG 24 hr tablet, TAKE 1 TABLET BY MOUTH EVERY DAY, Disp: 90 tablet, Rfl: 0 .  Multiple Vitamin (MULTIVITAMIN WITH MINERALS) TABS tablet, Take 1 tablet by mouth daily., Disp: , Rfl:  .  NITROSTAT 0.4 MG SL tablet, Place 0.4 mg under the tongue every 5 (five) minutes as needed for chest pain. Reported on 08/15/2015, Disp: , Rfl: 4 .   olmesartan-hydrochlorothiazide (BENICAR HCT) 20-12.5 MG tablet, Take 1 tablet by mouth daily., Disp: 14 tablet, Rfl: 0 .  ONETOUCH DELICA LANCETS 61P MISC, CHECK BLOOD GLUCOSE DAILY, Disp: 100 each, Rfl: 1 .  ONETOUCH VERIO test strip, TEST BLOOD SUGAR DAILY DX E11.9, Disp: 100 each, Rfl: 3 Social History   Socioeconomic History  . Marital status: Married    Spouse name: Not on file  . Number of children: Not on file  . Years of education: Not on file  . Highest education level: Not on file  Occupational History  . Not on file  Social Needs  . Financial resource strain: Not hard at all  . Food insecurity:    Worry: Never true    Inability: Never true  . Transportation needs:    Medical: No    Non-medical: No  Tobacco Use  . Smoking status: Former Smoker    Packs/day: 0.12    Years: 10.00    Pack years: 1.20    Types: Cigarettes    Last attempt to quit: 09/04/1982    Years since quitting: 35.5  . Smokeless tobacco: Never Used  Substance and Sexual Activity  . Alcohol use: No  . Drug use: No  . Sexual activity: Yes  Lifestyle  . Physical activity:    Days per week:  7 days    Minutes per session: 20 min  . Stress: Not at all  Relationships  . Social connections:    Talks on phone: More than three times a week    Gets together: More than three times a week    Attends religious service: More than 4 times per year    Active member of club or organization: No    Attends meetings of clubs or organizations: Never    Relationship status: Married  . Intimate partner violence:    Fear of current or ex partner: No    Emotionally abused: No    Physically abused: No    Forced sexual activity: No  Other Topics Concern  . Not on file  Social History Narrative  . Not on file   No family history on file.  Objective: Office vital signs reviewed. BP 115/67   Pulse 81   Temp (!) 97.2 F (36.2 C) (Oral)   Physical Examination:  General: Awake, alert, well nourished, No acute  distress HEENT: Normal, sclera white.  Moist mucous membranes. Cardio: regular rate and rhythm, S1S2 heard, no murmurs appreciated Pulm: clear to auscultation bilaterally, no wheezes, rhonchi or rales; normal work of breathing on room air Extremities: warm, well perfused, No edema, cyanosis or clubbing; +2 pulses bilaterally Neuro: Alert and oriented.  No focal neurologic deficits.  Assessment/ Plan: 84 y.o. male   1. Chest pain in adult Vital signs are stable.  EKG with changes in lead III and aVF.  He has flattening of the T waves.  Otherwise, seemingly improved compared to previous EKG. His heart score is at least 6.  May be higher pending troponin.  His chest pain is concerning, particularly given history of cardiac disease with concomitant stents.  I have recommended that he seek immediate medical attention in the [facility] emergency department.  He will be transported via EMS to [facility]. - EKG 12-Lead   Orders Placed This Encounter  Procedures  . EKG 12-Lead   No orders of the defined types were placed in this encounter.    Janora Norlander, DO [place] [phone]

## 2018-03-19 NOTE — Consult Note (Signed)
Cardiology Consultation:   Patient ID: Burnham Trost MRN: [id number]; DOB: [birth year]  Admit date: 03/19/2018 Date of Consult: 03/19/2018  Primary Care Provider: Dettinger, [name], MD Primary Cardiologist: Dr [name] Primary Electrophysiologist:  None    Patient Profile:   Dovid Bartko is a 84 y.o. male with a hx of CAD with prior stent to LAD and LCX  who is being seen today for the evaluation of chest pain at the request of Dr Thurnell Garbe.  History of Present Illness:   Mr. Dorton history of cath 08/2010 received DES to mid LAD, 04/2014 received DES to distal LCX, HTN, HL admitted with chest pain.   Reports episode of chest pain shortly after waking up this morning. Sharp 8/10 pain midchest, no other associated symptoms. Took NG x2, pain resolved after 5 minutes. Went back to sleep. Woke up later in the day, went to pcp and reported episode, came to ER. Denies any recurrent chest pain, Does not recall if this is similar to his previous angina.    K 4.5, Cr 0.93, WBC 5, Hgb 12.3, Plt 511 CXR no acute process Last cath 04/2014 cath: RCA mild disease small nondominant, LM patent, LCX distal 80-90%, LAD 20-30% proximal with patent LAD stent. Had PTCA and stenting to distal LCX with DES  Past Medical History:  Diagnosis Date  . Arthritis    "knees, wrists, knuckles" (04/26/2014)  . CAD (coronary artery disease)   . Headache    "had a couple/wk til ~ 2 wks ago" (04/26/2014)  . Hyperlipidemia   . Hypertension   . PAD (peripheral artery disease) ([place])   . Type II diabetes mellitus ([place])     Past Surgical History:  Procedure Laterality Date  . BACK SURGERY    . CATARACT EXTRACTION W/ INTRAOCULAR LENS  IMPLANT, BILATERAL Bilateral ~ 2014  . CORONARY ANGIOPLASTY WITH STENT PLACEMENT  08/2010; 04/26/2014   "1, LAD; 1"  . LEFT HEART CATHETERIZATION WITH CORONARY ANGIOGRAM N/A 04/26/2014   Procedure: LEFT HEART CATHETERIZATION WITH CORONARY ANGIOGRAM;  Surgeon: Laverda Page, MD;  Location: [facility] CATH LAB;  Service: Cardiovascular;  Laterality: N/A;  . LUMBAR LAMINECTOMY/DECOMPRESSION MICRODISCECTOMY  01/2010       Inpatient Medications: Scheduled Meds:  Continuous Infusions:  PRN Meds: morphine injection, nitroGLYCERIN  Allergies:   No Known Allergies  Social History:   Social History   Socioeconomic History  . Marital status: Married    Spouse name: Not on file  . Number of children: Not on file  . Years of education: Not on file  . Highest education level: Not on file  Occupational History  . Not on file  Social Needs  . Financial resource strain: Not hard at all  . Food insecurity:    Worry: Never true    Inability: Never true  . Transportation needs:    Medical: No    Non-medical: No  Tobacco Use  . Smoking status: Former Smoker    Packs/day: 0.12    Years: 10.00    Pack years: 1.20    Types: Cigarettes    Last attempt to quit: 09/04/1982    Years since quitting: 35.5  . Smokeless tobacco: Never Used  Substance and Sexual Activity  . Alcohol use: No  . Drug use: No  . Sexual activity: Yes  Lifestyle  . Physical activity:    Days per week: 7 days    Minutes per session: 20 min  . Stress: Not at all  Relationships  . Social  connections:    Talks on phone: More than three times a week    Gets together: More than three times a week    Attends religious service: More than 4 times per year    Active member of club or organization: No    Attends meetings of clubs or organizations: Never    Relationship status: Married  . Intimate partner violence:    Fear of current or ex partner: No    Emotionally abused: No    Physically abused: No    Forced sexual activity: No  Other Topics Concern  . Not on file  Social History Narrative  . Not on file    Family History:   No family history on file.   ROS:  Please see the history of present illness.   All other ROS reviewed and negative.     Physical Exam/Data:   Vitals:     03/19/18 1118 03/19/18 1121 03/19/18 1130  BP:  (!) 157/87 (!) 146/78  Pulse:  (!) 52 (!) 43  Resp:  14 19  Temp:  98.3 F (36.8 C)   TempSrc:  Oral   SpO2:  100%   Weight: 80.7 kg    Height: 5\' 9"  (1.753 m)     No intake or output data in the 24 hours ending 03/19/18 1350 Filed Weights   03/19/18 1118  Weight: 80.7 kg   Body mass index is 26.29 kg/m.  General:  Well nourished, well developed, in no acute distress HEENT: normal Lymph: no adenopathy Neck: no JVD Cardiac:  normal S1, S2; RRR; no murmur  Lungs:  clear to auscultation bilaterally, no wheezing, rhonchi or rales  Abd: soft, nontender, no hepatomegaly  Ext: no edema Musculoskeletal:  No deformities, BUE and BLE strength normal and equal Skin: warm and dry  Neuro:  CNs 2-12 intact, no focal abnormalities noted Psych:  Normal affect     Laboratory Data:  Chemistry Recent Labs  Lab 03/19/18 1230  NA 142  K 4.5  CL 107  CO2 29  GLUCOSE 98  BUN 17  CREATININE 0.93  CALCIUM 8.9  GFRNONAA >60  GFRAA >60  ANIONGAP 6    No results for input(s): PROT, ALBUMIN, AST, ALT, ALKPHOS, BILITOT in the last 168 hours. Hematology Recent Labs  Lab 03/19/18 1230  WBC 5.0  RBC 4.01*  HGB 12.3*  HCT 39.8  MCV 99.3  MCH 30.7  MCHC 30.9  RDW 13.0  PLT 511*   Cardiac Enzymes Recent Labs  Lab 03/19/18 1230  TROPONINI <0.03   No results for input(s): TROPIPOC in the last 168 hours.  BNPNo results for input(s): BNP, PROBNP in the last 168 hours.  DDimer No results for input(s): DDIMER in the last 168 hours.  Radiology/Studies:  Dg Chest 2 View  Result Date: 03/19/2018 CLINICAL DATA:  Onset chest pain today. EXAM: CHEST - 2 VIEW COMPARISON:  PA and lateral chest 01/17/2010. FINDINGS: The chest is mildly hyperexpanded. Lungs are clear. No pneumothorax or pleural fluid. Heart size is normal. No acute or focal bony abnormality. IMPRESSION: No acute disease. Pulmonary hyperexpansion suggestive of COPD.  Electronically Signed   By: [name] M.D.   On: 03/19/2018 12:27    Assessment and Plan:   1. CAD/ Chest pain - isolated episode of chest pain this morning without recurrence, some improvement with NG - thus far no objective evidence of ischemia by EKG or enzymes - would admit for obs overnight, cycle cardiac enzymes and EKG,  obtain echo. Keep npo at midnight in case ischemic testing is indicated - if recurrent pain, elevated trops, or EKG changes would start hep gtt at that time and place NG patch.  - continue home ASA, atorvastatin, imdur 30. Hold coreg due to bradycardia.   2. Sinus bradycardia - EKG shows sinus brady 47, no significant symptoms - hold coreg and follow on telemetry     For questions or updates, please contact [place] Please consult [email or web address] for contact info under     Signed, Carlyle Dolly, MD  03/19/2018 1:50 PM

## 2018-03-20 ENCOUNTER — Ambulatory Visit: Payer: Medicare HMO | Admitting: Gastroenterology

## 2018-03-20 ENCOUNTER — Observation Stay (HOSPITAL_BASED_OUTPATIENT_CLINIC_OR_DEPARTMENT_OTHER): Payer: Medicare HMO

## 2018-03-20 ENCOUNTER — Encounter (HOSPITAL_COMMUNITY): Payer: Self-pay

## 2018-03-20 DIAGNOSIS — R079 Chest pain, unspecified: Secondary | ICD-10-CM

## 2018-03-20 DIAGNOSIS — I739 Peripheral vascular disease, unspecified: Secondary | ICD-10-CM | POA: Diagnosis not present

## 2018-03-20 DIAGNOSIS — I2510 Atherosclerotic heart disease of native coronary artery without angina pectoris: Secondary | ICD-10-CM | POA: Diagnosis not present

## 2018-03-20 DIAGNOSIS — Z23 Encounter for immunization: Secondary | ICD-10-CM | POA: Diagnosis not present

## 2018-03-20 DIAGNOSIS — E785 Hyperlipidemia, unspecified: Secondary | ICD-10-CM | POA: Diagnosis not present

## 2018-03-20 DIAGNOSIS — Z9861 Coronary angioplasty status: Secondary | ICD-10-CM | POA: Diagnosis not present

## 2018-03-20 DIAGNOSIS — I10 Essential (primary) hypertension: Secondary | ICD-10-CM

## 2018-03-20 DIAGNOSIS — Z79899 Other long term (current) drug therapy: Secondary | ICD-10-CM | POA: Diagnosis not present

## 2018-03-20 DIAGNOSIS — E119 Type 2 diabetes mellitus without complications: Secondary | ICD-10-CM | POA: Diagnosis not present

## 2018-03-20 DIAGNOSIS — Z7982 Long term (current) use of aspirin: Secondary | ICD-10-CM | POA: Diagnosis not present

## 2018-03-20 DIAGNOSIS — R0789 Other chest pain: Secondary | ICD-10-CM

## 2018-03-20 LAB — NM MYOCAR MULTI W/SPECT W/WALL MOTION / EF
CHL CUP RESTING HR STRESS: 62 {beats}/min
LVDIAVOL: 107 mL (ref 62–150)
LVSYSVOL: 56 mL
NUC STRESS TID: 0.77
Peak HR: 88 {beats}/min
RATE: 0.33
SDS: 2
SRS: 1
SSS: 3

## 2018-03-20 LAB — LIPID PANEL
CHOLESTEROL: 127 mg/dL (ref 0–200)
HDL: 49 mg/dL (ref >40)
LDL Cholesterol: 72 mg/dL (ref 0–99)
Total CHOL/HDL Ratio: 2.6 RATIO
Triglycerides: 32 mg/dL (ref <150)
VLDL: 6 mg/dL (ref 0–40)

## 2018-03-20 LAB — GLUCOSE, CAPILLARY
GLUCOSE-CAPILLARY: 90 mg/dL (ref 70–99)
GLUCOSE-CAPILLARY: 119 mg/dL — AB (ref 70–99)

## 2018-03-20 LAB — TROPONIN I

## 2018-03-20 MED ORDER — SODIUM CHLORIDE 0.9% FLUSH
INTRAVENOUS | Status: AC
  Administered 2018-03-20: 10 mL via INTRAVENOUS
  Filled 2018-03-20: qty 10

## 2018-03-20 MED ORDER — ISOSORBIDE MONONITRATE ER 30 MG PO TB24: 45.0000 mg | ORAL_TABLET | Freq: Every day | ORAL | 0 refills | Status: DC

## 2018-03-20 MED ORDER — REGADENOSON 0.4 MG/5ML IV SOLN
INTRAVENOUS | Status: AC
  Administered 2018-03-20: 0.08 mg via INTRAVENOUS
  Filled 2018-03-20: qty 5

## 2018-03-20 MED ORDER — ISOSORBIDE MONONITRATE ER 30 MG PO TB24: 45.0000 mg | ORAL_TABLET | Freq: Every day | ORAL | Status: DC

## 2018-03-20 MED ORDER — TECHNETIUM TC 99M TETROFOSMIN IV KIT
30.0000 | PACK | Freq: Once | INTRAVENOUS | Status: AC | PRN
  Administered 2018-03-20: 31 via INTRAVENOUS

## 2018-03-20 MED ORDER — TECHNETIUM TC 99M TETROFOSMIN IV KIT
10.0000 | PACK | Freq: Once | INTRAVENOUS | Status: AC | PRN
  Administered 2018-03-20: 11 via INTRAVENOUS

## 2018-03-20 NOTE — Progress Notes (Addendum)
Progress Note  Patient Name: Joseph Byrd Date of Encounter: 03/20/2018  Primary Cardiologist: Adrian Prows, MD   Subjective   He denies any recurrent chest pain overnight or this AM. Has been walking around his room without any anginal symptoms. Has been NPO since midnight.   Inpatient Medications    Scheduled Meds: . aspirin  81 mg Oral Daily  . atorvastatin  20 mg Oral q1800  . cilostazol  100 mg Oral BID  . doxazosin  8 mg Oral QHS  . enoxaparin (LOVENOX) injection  40 mg Subcutaneous Q24H  . irbesartan  150 mg Oral Daily   And  . hydrochlorothiazide  12.5 mg Oral Daily  . Influenza vac split quadrivalent PF  0.5 mL Intramuscular Tomorrow-1000  . insulin aspart  0-9 Units Subcutaneous TID WC  . isosorbide mononitrate  30 mg Oral Daily  . pantoprazole  40 mg Oral Q0600   Continuous Infusions:  PRN Meds: acetaminophen, nitroGLYCERIN, ondansetron (ZOFRAN) IV, traZODone   Vital Signs    Vitals:   03/19/18 1721 03/19/18 1813 03/19/18 2109 03/20/18 0613  BP: (!) 174/93 (!) 176/87 (!) 153/83 137/87  Pulse: (!) 57 (!) 49 (!) 45 (!) 53  Resp: 17 18 18 18   Temp:   (!) 97.2 F (36.2 C) 98.6 F (37 C)  TempSrc:    Oral  SpO2: 97% 100% 97% 98%  Weight:      Height:       No intake or output data in the 24 hours ending 03/20/18 0754 Filed Weights   03/19/18 1118  Weight: 80.7 kg    Telemetry    Sinus bradycardia, HR in mid-40's to 60's with no significant arrhythmias or pauses. - Personally Reviewed  ECG    Sinus bradycardia, HR 46, with PAC's. No acute ST changes.  - Personally Reviewed  Physical Exam   General: Well developed, well nourished, male appearing in no acute distress. Head: Normocephalic, atraumatic.  Neck: Supple without bruits, JVD not elevated. Lungs:  Resp regular and unlabored, CTA without wheezing or rales. Heart: RRR, S1, S2, no S3, S4, or murmur; no rub. Abdomen: Soft, non-tender, non-distended with normoactive bowel sounds. No  hepatomegaly. No rebound/guarding. No obvious abdominal masses. Extremities: No clubbing, cyanosis, or lower extremity edema. Distal pedal pulses are 2+ bilaterally. Neuro: Alert and oriented X 3. Moves all extremities spontaneously. Psych: Normal affect.  Labs    Chemistry Recent Labs  Lab 03/19/18 1230  NA 142  K 4.5  CL 107  CO2 29  GLUCOSE 98  BUN 17  CREATININE 0.93  CALCIUM 8.9  GFRNONAA >60  GFRAA >60  ANIONGAP 6     Hematology Recent Labs  Lab 03/19/18 1230  WBC 5.0  RBC 4.01*  HGB 12.3*  HCT 39.8  MCV 99.3  MCH 30.7  MCHC 30.9  RDW 13.0  PLT 511*    Cardiac Enzymes Recent Labs  Lab 03/19/18 1230 03/19/18 1904 03/19/18 2054  TROPONINI <0.03 <0.03 <0.03   No results for input(s): TROPIPOC in the last 168 hours.   BNPNo results for input(s): BNP, PROBNP in the last 168 hours.   DDimer No results for input(s): DDIMER in the last 168 hours.   Radiology    Dg Chest 2 View  Result Date: 03/19/2018 CLINICAL DATA:  Onset chest pain today. EXAM: CHEST - 2 VIEW COMPARISON:  PA and lateral chest 01/17/2010. FINDINGS: The chest is mildly hyperexpanded. Lungs are clear. No pneumothorax or pleural fluid. Heart size is normal.  No acute or focal bony abnormality. IMPRESSION: No acute disease. Pulmonary hyperexpansion suggestive of COPD. Electronically Signed   By: [name] M.D.   On: 03/19/2018 12:27    Cardiac Studies   Cardiac Catheterization: 04/2014 Hemodynamic data: Left ventricular pressure was 99/2 with LVEDP of 5 mm mercury. Aortic pressure was 100/59 with a mean of 75 mm mercury. There was no pressure gradient across the aortic valve.   Left ventricle: No performed.  Right coronary artery: The vessel is Non-Dominant. Mild chronic Station and mild disease.  Left main coronary artery is large and calcified and is diffuse luminal irregularity without high-grade stenosis.  Circumflex coronary artery: A large vessel giving origin to a  large obtuse marginal 1. It is dominant, from the distal end, it gives origin to PDA and several PDA branches. Distal circumflex has a eccentric 80-90% stenosis, again mild calcification is evident. There is mild diffuse luminal irregularity in the proximal circumflex worry artery. Distal PDA has diffuse mild to moderate luminal irregularity.  LAD:  LAD gives origin to several small diagonals, its a large caliber vessel, mildly calcified in the proximal segment with mild 20-30% stenosis. Previously placed mid LAD stent is widely patent.  Interventional data: Successful PTCA and stenting of thedistal dominant circumflex coronary artery with implantation of a 3.5 x 16 mm Promus Premier DES.  Will need Dual antiplatelet therapy with Plavix and ASA 81 mg for at least 1 year.   Patient Profile     84 y.o. male w/ PMH of CAd (s/p DES to LAD in 2012 and DES to LCx in 04/2014), HTN, HLD, and Type 2 DM who presented to [facility] ED on 03/19/2018 for evaluation of chest pain.   Assessment & Plan    1. Chest Pain with Mixed Features - he reports being is his usual state of health until the day prior to admission when he developed chest discomfort which resoled with SL NTG. Experienced recurrent symptoms yesterday morning which was at rest but resolved with SL NTG. He had not experienced any chest discomfort within the past year until this event by his report. No recurrent symptoms since admission.  - initial and cyclic troponin values have been negative. EKG shows no acute ischemic changes.  - discussed with Dr. Harl Bowie as he has been NPO since midnight. Will plan for [facility] for ischemic evaluation as he reports being unable to walk on a treadmill at the time of his last stress test. If no significant ischemia, would consider further titration of Imdur to 45mg  daily. If high-risk, would need to be transferred to [facility] for a cardiac catheterization.   2. CAD - s/p DES to LAD in 2012 and DES to  LCx in 04/2014. Unclear if he has undergone further work-up with stress testing or echocardiogram imaging since as records from Dr. Irven Shelling records are not available. Nothing recorded in Epic.  - remains on ASA, Pletal, and statin therapy. BB discontinued due to bradycardia.   3. HTN - BP initially elevated while in the ED, improved to 137/87 on most recent check.  - continue PTA Cardura 8mg  daily, Imdur 30mg  daily, and Benicar 20-12.5mg  daily.   4. HLD - FLP shows total cholesterol of 127, HDL 49, and LDL 72. Remains on Atorvastatin 20mg  daily. Would consider further titration to 40mg  daily but will defer to his Primary Cardiologist.   5. Sinus Bradycardia - HR has been in the mid-40's to 50's since admission. Was on Coreg 3.125mg  BID  PTA which has been held. No significant pauses or episodes of CHB noted on telemetry. Would discontinue BB therapy at the time of discharge.    For questions or updates, please contact [place] Please consult [email or web address] for contact info under Cardiology/STEMI.   Arna Medici , PA-C 7:54 AM 03/20/2018 Pager: [phone]  Patient seen and discussed with PA Ahmed Prima, I agree with her documentation above. 84 yo male with history of CAD and prior stents admitted with chest. EKGs and enzymes do not show signs of ACS. Nuclear stress test show prior infarct without current ischemia. Increase imdur to 45mg  daily for additional antianginal effects, no plans for further cardiac testing at this time. Beta blocker stopped this admit due to bradycardia in the 40s.    Pt not followed by [facility] cardiology, he will need f/u with his primary cardiologist Dr [name] arranged at discharge.     Zandra Abts MD

## 2018-03-20 NOTE — Discharge Summary (Signed)
Physician Discharge Summary  Joseph Byrd QIW:[id number] DOB: 1933-12-12 DOA: 03/19/2018  PCP: Dettinger, [name], MD  Admit date: 03/19/2018 Discharge date: 03/20/2018  Admitted From: Home Disposition: Home  Recommendations for Outpatient Follow-up:  1. Follow up with PCP in 1-2 weeks 2. Patient has been advised to follow-up with his primary cardiologist, Dr. [name]  Discharge Condition: Stable CODE STATUS: Full code Diet recommendation: Heart healthy, carb modified  Brief/Interim Summary: 84 year old male with a history of coronary artery disease, hypertension, diabetes, presents to the emergency room with complaints of chest pain.  He was admitted to the hospital where he ruled out for ACS with negative cardiac markers and no acute EKG changes.  He was seen by cardiology who performed a stress test that was found to be a low risk study.  Recommendations were to increase Imdur to 45 mg daily for additional antianginal effects.  Beta-blockers were discontinued during hospitalization due to heart rate in the 40s.  Further recommendations were to have the patient follow-up with his primary cardiologist Dr. [name].  The remainder of his medical problems remained stable.  Discharge Diagnoses:  Principal Problem:   Chest pain Active Problems:   Essential hypertension, benign   CAD, multiple vessel   Hyperlipidemia with target LDL less than 70   PVD (peripheral vascular disease) with claudication (HCC)   Postsurgical percutaneous transluminal coronary angioplasty (PTCA) status   Type II diabetes mellitus [facility])    Discharge Instructions  Discharge Instructions    Diet - low sodium heart healthy   Complete by:  As directed    Increase activity slowly   Complete by:  As directed      Allergies as of 03/20/2018   No Known Allergies     Medication List    STOP taking these medications   carvedilol 3.125 MG tablet Commonly known as:  COREG     TAKE these medications    aspirin 81 MG tablet Take 81 mg by mouth daily.   atorvastatin 20 MG tablet Commonly known as:  LIPITOR Take 1 tablet (20 mg total) by mouth daily.   cilostazol 100 MG tablet Commonly known as:  PLETAL TAKE 1 TABLET BY MOUTH TWICE A DAY   doxazosin 8 MG tablet Commonly known as:  CARDURA TAKE 1 TABLET (8 MG TOTAL) BY MOUTH AT BEDTIME.   isosorbide mononitrate 30 MG 24 hr tablet Commonly known as:  IMDUR Take 1.5 tablets (45 mg total) by mouth daily. What changed:  how much to take   multivitamin with minerals Tabs tablet Take 1 tablet by mouth daily.   NITROSTAT 0.4 MG SL tablet Generic drug:  nitroGLYCERIN Place 0.4 mg under the tongue every 5 (five) minutes as needed for chest pain. Reported on 08/15/2015   olmesartan-hydrochlorothiazide 20-12.5 MG tablet Commonly known as:  BENICAR HCT Take 1 tablet by mouth daily.   ONETOUCH DELICA LANCETS 41D Misc CHECK BLOOD GLUCOSE DAILY   ONETOUCH VERIO test strip Generic drug:  glucose blood TEST BLOOD SUGAR DAILY DX E11.9      Follow-up Information    Adrian Prows, MD. Schedule an appointment as soon as possible for a visit in 2 week(s).   Specialty:  Cardiology Contact information: [address] [place] [address] [phone]          No Known Allergies  Consultations:  Cardiology   Procedures/Studies: Dg Chest 2 View  Result Date: 03/19/2018 CLINICAL DATA:  Onset chest pain today. EXAM: CHEST - 2 VIEW COMPARISON:  PA  and lateral chest 01/17/2010. FINDINGS: The chest is mildly hyperexpanded. Lungs are clear. No pneumothorax or pleural fluid. Heart size is normal. No acute or focal bony abnormality. IMPRESSION: No acute disease. Pulmonary hyperexpansion suggestive of COPD. Electronically Signed   By: [name] M.D.   On: 03/19/2018 12:27   Nm Myocar Multi W/spect W/wall Motion / Ef  Result Date: 03/20/2018  There was no ST segment deviation noted during stress.  Findings consistent with prior  inferior/basal inferoseptal/inferoapical myocardial infarction. There is no current ischemia.  This is a low to intermediate risk study based on prior scar and mildly decreased LVEF, there is no myocardium currently at jeopardy.  The left ventricular ejection fraction is mildly decreased (48%).        Subjective: Feeling better.  No further chest pain.  Discharge Exam: Vitals:   03/19/18 1813 03/19/18 2109 03/20/18 0613 03/20/18 1346  BP: (!) 176/87 (!) 153/83 137/87 117/71  Pulse: (!) 49 (!) 45 (!) 53 (!) 56  Resp: 18 18 18 16   Temp:  (!) 97.2 F (36.2 C) 98.6 F (37 C) 98 F (36.7 C)  TempSrc:   Oral Oral  SpO2: 100% 97% 98% 99%  Weight:      Height:        General: Pt is alert, awake, not in acute distress Cardiovascular: RRR, S1/S2 +, no rubs, no gallops Respiratory: CTA bilaterally, no wheezing, no rhonchi Abdominal: Soft, NT, ND, bowel sounds + Extremities: no edema, no cyanosis    The results of significant diagnostics from this hospitalization (including imaging, microbiology, ancillary and laboratory) are listed below for reference.     Microbiology: No results found for this or any previous visit (from the past 240 hour(s)).   Labs: BNP (last 3 results) No results for input(s): BNP in the last 8760 hours. Basic Metabolic Panel: Recent Labs  Lab 03/19/18 1230  NA 142  K 4.5  CL 107  CO2 29  GLUCOSE 98  BUN 17  CREATININE 0.93  CALCIUM 8.9   Liver Function Tests: No results for input(s): AST, ALT, ALKPHOS, BILITOT, PROT, ALBUMIN in the last 168 hours. No results for input(s): LIPASE, AMYLASE in the last 168 hours. No results for input(s): AMMONIA in the last 168 hours. CBC: Recent Labs  Lab 03/19/18 1230  WBC 5.0  HGB 12.3*  HCT 39.8  MCV 99.3  PLT 511*   Cardiac Enzymes: Recent Labs  Lab 03/19/18 1230 03/19/18 1904 03/19/18 2054 03/20/18 0023  TROPONINI <0.03 <0.03 <0.03 <0.03   BNP: Invalid input(s): POCBNP CBG: Recent Labs   Lab 03/19/18 2109 03/20/18 0812 03/20/18 1248  GLUCAP 100* 90 119*   D-Dimer No results for input(s): DDIMER in the last 72 hours. Hgb A1c Recent Labs    03/19/18 1230  HGBA1C 4.5*   Lipid Profile Recent Labs    03/20/18 0516  CHOL 127  HDL 49  LDLCALC 72  TRIG 32  CHOLHDL 2.6   Thyroid function studies Recent Labs    03/19/18 1230  TSH 1.042   Anemia work up No results for input(s): VITAMINB12, FOLATE, FERRITIN, TIBC, IRON, RETICCTPCT in the last 72 hours. Urinalysis    Component Value Date/Time   COLORURINE YELLOW 01/17/2010 [place] 01/17/2010 1335   LABSPEC 1.025 01/17/2010 1335   PHURINE 7.0 01/17/2010 1335   GLUCOSEU NEGATIVE 01/17/2010 1335   HGBUR NEGATIVE 01/17/2010 1335   BILIRUBINUR NEGATIVE 01/17/2010 1335   KETONESUR NEGATIVE 01/17/2010 1335   PROTEINUR NEGATIVE  01/17/2010 1335   UROBILINOGEN 1.0 01/17/2010 1335   NITRITE NEGATIVE 01/17/2010 1335   LEUKOCYTESUR  01/17/2010 1335    NEGATIVE MICROSCOPIC NOT DONE ON URINES WITH NEGATIVE PROTEIN, BLOOD, LEUKOCYTES, NITRITE, OR GLUCOSE <1000 mg/dL.   Sepsis Labs Invalid input(s): PROCALCITONIN,  WBC,  LACTICIDVEN Microbiology No results found for this or any previous visit (from the past 240 hour(s)).   Time coordinating discharge: 17mins  SIGNED:   Kathie Dike, MD  [facility] 03/20/2018, 7:21 PM Pager   If 7PM-7AM, please contact night-coverage [email or web address] Password [email or web address]

## 2018-03-20 NOTE — Progress Notes (Signed)
EKG done and placed on chart

## 2018-03-20 NOTE — Progress Notes (Deleted)
Referring Provider: Dettinger, [name], MD Primary Care Physician:  Dettinger, [name], MD   Reason for Consultation:  ***   IMPRESSION:  ***  PLAN: ***   HPI: Joseph Byrd is a 84 y.o. male who had been entered in surveillance for a colonoscopy in 10 years after his last colonoscopy in 2009.   Admitted to the hospital yesterday for chestpain.   Screening colonoscopy with Dr. Deatra Ina 03/24/08: hyperplastic sigmoid polyp, diverticulosis  Past Medical History:  Diagnosis Date  . Arthritis    "knees, wrists, knuckles" (04/26/2014)  . CAD (coronary artery disease)   . Headache    "had a couple/wk til ~ 2 wks ago" (04/26/2014)  . Hyperlipidemia   . Hypertension   . PAD (peripheral artery disease) ([place])   . Type II diabetes mellitus ([place])     Past Surgical History:  Procedure Laterality Date  . BACK SURGERY    . CATARACT EXTRACTION W/ INTRAOCULAR LENS  IMPLANT, BILATERAL Bilateral ~ 2014  . CORONARY ANGIOPLASTY WITH STENT PLACEMENT  08/2010; 04/26/2014   "1, LAD; 1"  . LEFT HEART CATHETERIZATION WITH CORONARY ANGIOGRAM N/A 04/26/2014   Procedure: LEFT HEART CATHETERIZATION WITH CORONARY ANGIOGRAM;  Surgeon: Laverda Page, MD;  Location: [facility] CATH LAB;  Service: Cardiovascular;  Laterality: N/A;  . LUMBAR LAMINECTOMY/DECOMPRESSION MICRODISCECTOMY  01/2010    Prior to Admission medications   Medication Sig Start Date End Date Taking? Authorizing Provider  aspirin 81 MG tablet Take 81 mg by mouth daily.      [provider]  atorvastatin (LIPITOR) 20 MG tablet Take 1 tablet (20 mg total) by mouth daily. 05/29/17   Dettinger, [name], MD  carvedilol (COREG) 3.125 MG tablet TAKE 1 TABLET (3.125 MG TOTAL) BY MOUTH 2 (TWO) TIMES DAILY WITH A MEAL. 02/24/18   Dettinger, [name], MD  cilostazol (PLETAL) 100 MG tablet TAKE 1 TABLET BY MOUTH TWICE A DAY 02/24/18   Dettinger, [name], MD  doxazosin (CARDURA) 8 MG tablet TAKE 1 TABLET (8 MG TOTAL) BY MOUTH AT  BEDTIME. 11/19/17   Dettinger, [name], MD  isosorbide mononitrate (IMDUR) 30 MG 24 hr tablet TAKE 1 TABLET BY MOUTH EVERY DAY 02/24/18   Dettinger, [name], MD  Multiple Vitamin (MULTIVITAMIN WITH MINERALS) TABS tablet Take 1 tablet by mouth daily.    [provider]  NITROSTAT 0.4 MG SL tablet Place 0.4 mg under the tongue every 5 (five) minutes as needed for chest pain. Reported on 08/15/2015 04/14/14   [provider]  olmesartan-hydrochlorothiazide (BENICAR HCT) 20-12.5 MG tablet Take 1 tablet by mouth daily. 11/11/17   Dettinger, [name], MD  [place] LANCETS 61W MISC CHECK BLOOD GLUCOSE DAILY 02/28/17   Dettinger, [name], MD  [facility] VERIO test strip TEST BLOOD SUGAR DAILY DX E11.9 12/29/17   Dettinger, [name], MD    No current facility-administered medications for this visit.    No current outpatient medications on file.   Facility-Administered Medications Ordered in Other Visits  Medication Dose Route Frequency Provider Last Rate Last Dose  . acetaminophen (TYLENOL) tablet 650 mg  650 mg Oral Q4H PRN Johnson, Clanford L, MD      . aspirin chewable tablet 81 mg  81 mg Oral Daily Johnson, Clanford L, MD      . atorvastatin (LIPITOR) tablet 20 mg  20 mg Oral q1800 Johnson, Clanford L, MD   20 mg at 03/19/18 1838  . cilostazol (PLETAL) tablet 100 mg  100 mg  Oral BID Wynetta Emery, Clanford L, MD   100 mg at 03/19/18 2241  . doxazosin (CARDURA) tablet 8 mg  8 mg Oral QHS Johnson, Clanford L, MD      . enoxaparin (LOVENOX) injection 40 mg  40 mg Subcutaneous Q24H Johnson, Clanford L, MD   40 mg at 03/19/18 2241  . irbesartan (AVAPRO) tablet 150 mg  150 mg Oral Daily Johnson, Clanford L, MD       And  . hydrochlorothiazide (MICROZIDE) capsule 12.5 mg  12.5 mg Oral Daily Johnson, Clanford L, MD      . Influenza vac split quadrivalent PF (FLUZONE HIGH-DOSE) injection 0.5 mL  0.5 mL Intramuscular Tomorrow-1000 Johnson, Clanford L, MD      . insulin aspart (novoLOG)  injection 0-9 Units  0-9 Units Subcutaneous TID WC Johnson, Clanford L, MD      . isosorbide mononitrate (IMDUR) 24 hr tablet 30 mg  30 mg Oral Daily Johnson, Clanford L, MD      . nitroGLYCERIN (NITROSTAT) SL tablet 0.4 mg  0.4 mg Sublingual Q5 min PRN Johnson, Clanford L, MD      . ondansetron (ZOFRAN) injection 4 mg  4 mg Intravenous Q6H PRN Johnson, Clanford L, MD      . pantoprazole (PROTONIX) EC tablet 40 mg  40 mg Oral Q0600 Johnson, Clanford L, MD      . traZODone (DESYREL) tablet 25 mg  25 mg Oral QHS PRN Wynetta Emery, Clanford L, MD        Allergies as of 03/20/2018  . (No Known Allergies)    No family history on file.  Social History   Socioeconomic History  . Marital status: Married    Spouse name: Not on file  . Number of children: Not on file  . Years of education: Not on file  . Highest education level: Not on file  Occupational History  . Not on file  Social Needs  . Financial resource strain: Not hard at all  . Food insecurity:    Worry: Never true    Inability: Never true  . Transportation needs:    Medical: No    Non-medical: No  Tobacco Use  . Smoking status: Former Smoker    Packs/day: 0.12    Years: 10.00    Pack years: 1.20    Types: Cigarettes    Last attempt to quit: 09/04/1982    Years since quitting: 35.5  . Smokeless tobacco: Never Used  Substance and Sexual Activity  . Alcohol use: No  . Drug use: No  . Sexual activity: Yes  Lifestyle  . Physical activity:    Days per week: 7 days    Minutes per session: 20 min  . Stress: Not at all  Relationships  . Social connections:    Talks on phone: More than three times a week    Gets together: More than three times a week    Attends religious service: More than 4 times per year    Active member of club or organization: No    Attends meetings of clubs or organizations: Never    Relationship status: Married  . Intimate partner violence:    Fear of current or ex partner: No    Emotionally abused:  No    Physically abused: No    Forced sexual activity: No  Other Topics Concern  . Not on file  Social History Narrative  . Not on file    Review of Systems: 12 system ROS is negative  except as noted above.   Physical Exam: @VSRANGES @   General:   Alert, well-nourished, pleasant and cooperative in NAD Head:  Normocephalic and atraumatic. Eyes:  Sclera clear, no icterus.   Conjunctiva pink. Mouth:  No deformity or lesions.   Neck:  Supple; no thyromegaly. Lungs:  Clear throughout to auscultation.   No wheezes.  Heart:  Regular rate and rhythm; no murmurs Abdomen:  Soft, nontender, normal bowel sounds. No rebound or guarding. No hepatosplenomegaly Rectal:  Deferred  Msk:  Symmetrical without gross deformities. Extremities:  No gross deformities or edema. Neurologic:  Alert and  oriented x4;  grossly nonfocal Skin:  No rash or bruise. Psych:  Alert and cooperative. Normal mood and affect.  Lab Results: Recent Labs    03/19/18 1230  WBC 5.0  HGB 12.3*  HCT 39.8  PLT 511*   BMET Recent Labs    03/19/18 1230  NA 142  K 4.5  CL 107  CO2 29  GLUCOSE 98  BUN 17  CREATININE 0.93  CALCIUM 8.9   LFT No results for input(s): PROT, ALBUMIN, AST, ALT, ALKPHOS, BILITOT, BILIDIR, IBILI in the last 72 hours. PT/INR Recent Labs    03/19/18 1230  LABPROT 14.6  INR 1.15   Hepatitis Panel No results for input(s): HEPBSAG, HCVAB, HEPAIGM, HEPBIGM in the last 72 hours.    Studies/Results: Dg Chest 2 View  Result Date: 03/19/2018 CLINICAL DATA:  Onset chest pain today. EXAM: CHEST - 2 VIEW COMPARISON:  PA and lateral chest 01/17/2010. FINDINGS: The chest is mildly hyperexpanded. Lungs are clear. No pneumothorax or pleural fluid. Heart size is normal. No acute or focal bony abnormality. IMPRESSION: No acute disease. Pulmonary hyperexpansion suggestive of COPD. Electronically Signed   By: [name] M.D.   On: 03/19/2018 12:27      Friona. Tarri Glenn Md,  [facility] 03/20/2018, 6:26 AM

## 2018-03-20 NOTE — Care Management Obs Status (Signed)
[place] NOTIFICATION   Patient Details  Name: Joseph Byrd MRN: [id number] Date of Birth: [birth year]   Medicare Observation Status Notification Given:  Yes    Shelda Altes 03/20/2018, 9:31 AM

## 2018-03-20 NOTE — Progress Notes (Signed)
IV removed, patient tolerated well.  Reviewed AVS with patient who verbalized understanding.  Patient transported home by his wife.

## 2018-03-29 ENCOUNTER — Other Ambulatory Visit: Payer: Self-pay

## 2018-03-29 ENCOUNTER — Emergency Department (HOSPITAL_COMMUNITY)
Admission: EM | Admit: 2018-03-29 | Discharge: 2018-03-29 | Disposition: A | Discharge status: Home / Self Care | Payer: Medicare HMO | Attending: Emergency Medicine | Admitting: Emergency Medicine

## 2018-03-29 ENCOUNTER — Emergency Department (HOSPITAL_COMMUNITY): Payer: Medicare HMO

## 2018-03-29 ENCOUNTER — Encounter (HOSPITAL_COMMUNITY): Payer: Self-pay | Admitting: Emergency Medicine

## 2018-03-29 DIAGNOSIS — Z87891 Personal history of nicotine dependence: Secondary | ICD-10-CM | POA: Insufficient documentation

## 2018-03-29 DIAGNOSIS — E119 Type 2 diabetes mellitus without complications: Secondary | ICD-10-CM | POA: Insufficient documentation

## 2018-03-29 DIAGNOSIS — R0789 Other chest pain: Secondary | ICD-10-CM

## 2018-03-29 DIAGNOSIS — I2510 Atherosclerotic heart disease of native coronary artery without angina pectoris: Secondary | ICD-10-CM | POA: Diagnosis not present

## 2018-03-29 DIAGNOSIS — I10 Essential (primary) hypertension: Secondary | ICD-10-CM | POA: Diagnosis not present

## 2018-03-29 DIAGNOSIS — R079 Chest pain, unspecified: Secondary | ICD-10-CM | POA: Diagnosis not present

## 2018-03-29 LAB — CBC WITH DIFFERENTIAL/PLATELET
Abs Immature Granulocytes: 0.02 10*3/uL (ref 0.00–0.07)
BASOS ABS: 0 10*3/uL (ref 0.0–0.1)
BASOS PCT: 1 %
EOS PCT: 1 %
Eosinophils Absolute: 0 10*3/uL (ref 0.0–0.5)
HEMATOCRIT: 39.4 % (ref 39.0–52.0)
Hemoglobin: 12.3 g/dL — ABNORMAL LOW (ref 13.0–17.0)
IMMATURE GRANULOCYTES: 0 %
Lymphocytes Relative: 15 %
Lymphs Abs: 0.8 10*3/uL (ref 0.7–4.0)
MCH: 30.1 pg (ref 26.0–34.0)
MCHC: 31.2 g/dL (ref 30.0–36.0)
MCV: 96.6 fL (ref 80.0–100.0)
MONOS PCT: 7 %
Monocytes Absolute: 0.4 10*3/uL (ref 0.1–1.0)
NEUTROS PCT: 76 %
NRBC: 0 % (ref 0.0–0.2)
Neutro Abs: 4 10*3/uL (ref 1.7–7.7)
PLATELETS: 567 10*3/uL — AB (ref 150–400)
RBC: 4.08 MIL/uL — ABNORMAL LOW (ref 4.22–5.81)
RDW: 13 % (ref 11.5–15.5)
WBC: 5.2 10*3/uL (ref 4.0–10.5)

## 2018-03-29 LAB — COMPREHENSIVE METABOLIC PANEL
ALT: 15 U/L (ref 0–44)
ANION GAP: 7 (ref 5–15)
AST: 26 U/L (ref 15–41)
Albumin: 4.4 g/dL (ref 3.5–5.0)
Alkaline Phosphatase: 53 U/L (ref 38–126)
BUN: 23 mg/dL (ref 8–23)
CHLORIDE: 106 mmol/L (ref 98–111)
CO2: 26 mmol/L (ref 22–32)
Calcium: 9.1 mg/dL (ref 8.9–10.3)
Creatinine, Ser: 1.16 mg/dL (ref 0.61–1.24)
GFR, EST NON AFRICAN AMERICAN: 57 mL/min — AB (ref >60)
Glucose, Bld: 113 mg/dL — ABNORMAL HIGH (ref 70–99)
POTASSIUM: 4.1 mmol/L (ref 3.5–5.1)
Sodium: 139 mmol/L (ref 135–145)
Total Bilirubin: 1.2 mg/dL (ref 0.3–1.2)
Total Protein: 7.7 g/dL (ref 6.5–8.1)

## 2018-03-29 LAB — TROPONIN I: Troponin I: <0.03 ng/mL (ref <0.03)

## 2018-03-29 MED ORDER — ASPIRIN 81 MG PO CHEW
324.0000 mg | CHEWABLE_TABLET | Freq: Once | ORAL | Status: AC
  Administered 2018-03-29: 324 mg via ORAL
  Filled 2018-03-29: qty 4

## 2018-03-29 NOTE — Discharge Instructions (Addendum)
Continue your current medications, follow-up with your cardiologist and/or primary care doctor, return to the emergency room as needed for worsening symptoms severe pain

## 2018-03-29 NOTE — ED Triage Notes (Signed)
PT c/o dull chest pain that started this am. PT denies any SOB at this time.

## 2018-03-29 NOTE — ED Provider Notes (Signed)
[facility] EMERGENCY DEPARTMENT Provider Note   CSN: [id number] Arrival date & time: 03/29/18  1304     History   Chief Complaint Chief Complaint  Patient presents with  . Chest Pain    HPI Joseph Byrd is a 84 y.o. male.  The history is provided by the patient.  Chest Pain   This is a new problem. The current episode started less than 1 hour ago. The pain is associated with rest. The pain is present in the lateral region. The pain is severe. The quality of the pain is described as brief and sharp. Duration of episode(s) is 3 seconds. Pertinent negatives include no abdominal pain, no cough, no diaphoresis, no fever, no lower extremity edema, no nausea and no shortness of breath.  Patient was recently admitted to the hospital on October 17 for similar symptoms.  He does have history of coronary artery disease and had successful stenting back in 2015.  Patient was observed overnight.  He was seen by cardiology and had a stress test.  Past Medical History:  Diagnosis Date  . Arthritis    "knees, wrists, knuckles" (04/26/2014)  . CAD (coronary artery disease)   . Headache    "had a couple/wk til ~ 2 wks ago" (04/26/2014)  . Hyperlipidemia   . Hypertension   . PAD (peripheral artery disease) ([place])   . Type II diabetes mellitus [facility])     Patient Active Problem List   Diagnosis Date Noted  . Chest pain 03/19/2018  . Type II diabetes mellitus ([place]) 03/19/2018  . Overweight (BMI 25.0-29.9) 12/18/2017  . Anemia 12/16/2016  . Vitamin D deficiency 08/30/2014  . Postsurgical percutaneous transluminal coronary angioplasty (PTCA) status 04/26/2014  . Cataract   . Arthritis of both knees 01/05/2013  . Essential hypertension, benign 08/22/2010  . CAD, multiple vessel 08/22/2010  . Hyperlipidemia with target LDL less than 70 08/22/2010  . PVD (peripheral vascular disease) with claudication ([place]) 08/22/2010    Past Surgical History:  Procedure Laterality Date  . BACK  SURGERY    . CATARACT EXTRACTION W/ INTRAOCULAR LENS  IMPLANT, BILATERAL Bilateral ~ 2014  . CORONARY ANGIOPLASTY WITH STENT PLACEMENT  08/2010; 04/26/2014   "1, LAD; 1"  . LEFT HEART CATHETERIZATION WITH CORONARY ANGIOGRAM N/A 04/26/2014   Procedure: LEFT HEART CATHETERIZATION WITH CORONARY ANGIOGRAM;  Surgeon: Laverda Page, MD;  Location: [facility] CATH LAB;  Service: Cardiovascular;  Laterality: N/A;  . LUMBAR LAMINECTOMY/DECOMPRESSION MICRODISCECTOMY  01/2010        Home Medications    Prior to Admission medications   Medication Sig Start Date End Date Taking? Authorizing Provider  aspirin 81 MG tablet Take 81 mg by mouth daily.     Yes [provider]  atorvastatin (LIPITOR) 20 MG tablet Take 1 tablet (20 mg total) by mouth daily. 05/29/17  Yes Dettinger, [name], MD  cilostazol (PLETAL) 100 MG tablet TAKE 1 TABLET BY MOUTH TWICE A DAY 02/24/18  Yes Dettinger, [name], MD  doxazosin (CARDURA) 8 MG tablet TAKE 1 TABLET (8 MG TOTAL) BY MOUTH AT BEDTIME. 11/19/17  Yes Dettinger, [name], MD  isosorbide mononitrate (IMDUR) 30 MG 24 hr tablet Take 1.5 tablets (45 mg total) by mouth daily. 03/20/18  Yes Kathie Dike, MD  Multiple Vitamin (MULTIVITAMIN WITH MINERALS) TABS tablet Take 1 tablet by mouth daily.   Yes [provider]  olmesartan-hydrochlorothiazide (BENICAR HCT) 20-12.5 MG tablet Take 1 tablet by mouth daily. 11/11/17  Yes Dettinger, [name], MD  ONETOUCH DELICA LANCETS 40J MISC CHECK BLOOD GLUCOSE DAILY 02/28/17  Yes Dettinger, [name], MD  ONETOUCH VERIO test strip TEST BLOOD SUGAR DAILY DX E11.9 12/29/17  Yes Dettinger, [name], MD  NITROSTAT 0.4 MG SL tablet Place 0.4 mg under the tongue every 5 (five) minutes as needed for chest pain. Reported on 08/15/2015 04/14/14   [provider]    Family History History reviewed. No pertinent family history.  Social History Social History   Tobacco Use  . Smoking status: Former Smoker    Packs/day:  0.12    Years: 10.00    Pack years: 1.20    Types: Cigarettes    Last attempt to quit: 09/04/1982    Years since quitting: 35.5  . Smokeless tobacco: Never Used  Substance Use Topics  . Alcohol use: No  . Drug use: No     Allergies   Patient has no known allergies.   Review of Systems Review of Systems  Constitutional: Negative for diaphoresis and fever.  Respiratory: Negative for cough and shortness of breath.   Cardiovascular: Positive for chest pain.  Gastrointestinal: Negative for abdominal pain and nausea.  All other systems reviewed and are negative.    Physical Exam Updated Vital Signs BP 133/77   Pulse (!) 56   Temp 98.2 F (36.8 C) (Oral)   Resp 17   Ht 1.753 m (5\' 9" )   Wt 80.7 kg   SpO2 97%   BMI 26.29 kg/m   Physical Exam  Constitutional: He appears well-developed and well-nourished. No distress.  HENT:  Head: Normocephalic and atraumatic.  Right Ear: External ear normal.  Left Ear: External ear normal.  Eyes: Conjunctivae are normal. Right eye exhibits no discharge. Left eye exhibits no discharge. No scleral icterus.  Neck: Neck supple. No tracheal deviation present.  Cardiovascular: Normal rate, regular rhythm and intact distal pulses.  Pulmonary/Chest: Effort normal and breath sounds normal. No stridor. No respiratory distress. He has no wheezes. He has no rales.  Abdominal: Soft. Bowel sounds are normal. He exhibits no distension. There is no tenderness. There is no rebound and no guarding.  Musculoskeletal: He exhibits no edema or tenderness.  Neurological: He is alert. He has normal strength. No cranial nerve deficit (no facial droop, extraocular movements intact, no slurred speech) or sensory deficit. He exhibits normal muscle tone. He displays no seizure activity. Coordination normal.  Skin: Skin is warm and dry. No rash noted.  Psychiatric: He has a normal mood and affect.  Nursing note and vitals reviewed.    ED Treatments / Results    Labs (all labs ordered are listed, but only abnormal results are displayed) Labs Reviewed  CBC WITH DIFFERENTIAL/PLATELET - Abnormal; Notable for the following components:      Result Value   RBC 4.08 (*)    Hemoglobin 12.3 (*)    Platelets 567 (*)    All other components within normal limits  COMPREHENSIVE METABOLIC PANEL - Abnormal; Notable for the following components:   Glucose, Bld 113 (*)    GFR calc non Af Amer 57 (*)    All other components within normal limits  TROPONIN I  TROPONIN I    EKG EKG Interpretation  Date/Time:  Sunday March 29 2018 13:16:02 EDT Ventricular Rate:  73 PR Interval:  148 QRS Duration: 112 QT Interval:  404 QTC Calculation: 445 R Axis:   7 Text Interpretation:  Normal sinus rhythm Normal ECG Since last tracing rate faster Confirmed by Dorie Rank (  [id number]) on 03/29/2018 2:01:45 PM   Radiology Dg Chest 2 View  Result Date: 03/29/2018 CLINICAL DATA:  Chest pain at church today. EXAM: CHEST - 2 VIEW COMPARISON:  None. FINDINGS: The heart size and mediastinal contours are within normal limits. Mild aortic atherosclerosis without aneurysmal dilatation. Both lungs are clear. The visualized skeletal structures are unremarkable. IMPRESSION: No active cardiopulmonary disease. Electronically Signed   By: Ashley Royalty M.D.   On: 03/29/2018 14:48    Procedures Procedures (including critical care time)  Medications Ordered in ED Medications  aspirin chewable tablet 324 mg (324 mg Oral Given 03/29/18 1515)     Initial Impression / Assessment and Plan / ED Course  I have reviewed the triage vital signs and the nursing notes.  Pertinent labs & imaging results that were available during my care of the patient were reviewed by me and considered in my medical decision making (see chart for details).  Clinical Course as of Mar 29 1753  Sun Mar 29, 2018  1752 Labs are normal.   [JK]  6808 X-ray without acute findings.   [UP]  1031 EkG is reassuring    [JK]    Clinical Course User Index [JK] Dorie Rank, MD  Patient presented to the emergency room with recurrent chest pain.  Patient was recently in the hospital and had a reassuring evaluation that included a stress test.  Patient had a brief episode of chest pain that lasted seconds.  He did not have any other symptoms such as nausea, diaphoresis or radiation of the pain.  Patient was monitored in the emergency room for several hours.  He had no recurrent episodes.  Delta troponins were negative.  I doubt that his symptoms are related to an acute coronary syndrome.  I do not think that repeat hospitalization for overnight observation is necessary at this time.  I discussed close outpatient follow-up with his cardiologist.  Patient is wife comfortable with this plan.  Final Clinical Impressions(s) / ED Diagnoses   Final diagnoses:  Other chest pain    ED Discharge Orders    None       Dorie Rank, MD 03/29/18 1754

## 2018-04-08 DIAGNOSIS — I10 Essential (primary) hypertension: Secondary | ICD-10-CM | POA: Diagnosis not present

## 2018-04-08 DIAGNOSIS — I70213 Atherosclerosis of native arteries of extremities with intermittent claudication, bilateral legs: Secondary | ICD-10-CM | POA: Diagnosis not present

## 2018-04-08 DIAGNOSIS — I25119 Atherosclerotic heart disease of native coronary artery with unspecified angina pectoris: Secondary | ICD-10-CM | POA: Diagnosis not present

## 2018-04-08 DIAGNOSIS — E782 Mixed hyperlipidemia: Secondary | ICD-10-CM | POA: Diagnosis not present

## 2018-05-24 ENCOUNTER — Other Ambulatory Visit: Payer: Self-pay | Admitting: Family Medicine

## 2018-05-24 DIAGNOSIS — I10 Essential (primary) hypertension: Secondary | ICD-10-CM

## 2018-05-28 ENCOUNTER — Other Ambulatory Visit: Payer: Self-pay | Admitting: Family Medicine

## 2018-05-28 NOTE — Telephone Encounter (Signed)
OV 06/22/18

## 2018-06-08 ENCOUNTER — Other Ambulatory Visit: Payer: Self-pay | Admitting: Family Medicine

## 2018-06-08 DIAGNOSIS — I10 Essential (primary) hypertension: Secondary | ICD-10-CM

## 2018-06-08 DIAGNOSIS — I2510 Atherosclerotic heart disease of native coronary artery without angina pectoris: Secondary | ICD-10-CM

## 2018-06-08 DIAGNOSIS — E785 Hyperlipidemia, unspecified: Secondary | ICD-10-CM

## 2018-06-22 ENCOUNTER — Encounter: Payer: Self-pay | Admitting: Family Medicine

## 2018-06-22 ENCOUNTER — Ambulatory Visit (INDEPENDENT_AMBULATORY_CARE_PROVIDER_SITE_OTHER): Payer: Medicare HMO | Admitting: Family Medicine

## 2018-06-22 VITALS — BP 106/62 | HR 84 | Temp 97.0°F | Ht 69.0 in | Wt 168.2 lb

## 2018-06-22 DIAGNOSIS — I2510 Atherosclerotic heart disease of native coronary artery without angina pectoris: Secondary | ICD-10-CM

## 2018-06-22 DIAGNOSIS — R413 Other amnesia: Secondary | ICD-10-CM

## 2018-06-22 DIAGNOSIS — E663 Overweight: Secondary | ICD-10-CM | POA: Diagnosis not present

## 2018-06-22 DIAGNOSIS — I10 Essential (primary) hypertension: Secondary | ICD-10-CM

## 2018-06-22 DIAGNOSIS — E785 Hyperlipidemia, unspecified: Secondary | ICD-10-CM | POA: Diagnosis not present

## 2018-06-22 DIAGNOSIS — Z794 Long term (current) use of insulin: Secondary | ICD-10-CM | POA: Diagnosis not present

## 2018-06-22 DIAGNOSIS — E1169 Type 2 diabetes mellitus with other specified complication: Secondary | ICD-10-CM | POA: Diagnosis not present

## 2018-06-22 LAB — BAYER DCA HB A1C WAIVED: HB A1C (BAYER DCA - WAIVED): 5 % (ref <7.0)

## 2018-06-22 MED ORDER — DOXAZOSIN MESYLATE 8 MG PO TABS: 8.0000 mg | ORAL_TABLET | Freq: Every day | ORAL | 3 refills | Status: DC

## 2018-06-22 MED ORDER — ATORVASTATIN CALCIUM 20 MG PO TABS: 20.0000 mg | ORAL_TABLET | Freq: Every day | ORAL | 3 refills | Status: DC

## 2018-06-22 MED ORDER — CILOSTAZOL 100 MG PO TABS: 100.0000 mg | ORAL_TABLET | Freq: Two times a day (BID) | ORAL | 3 refills | Status: DC

## 2018-06-22 NOTE — Progress Notes (Signed)
BP 106/62   Pulse 84   Temp (!) 97 F (36.1 C) (Oral)   Ht 5' 9" (1.753 m)   Wt 168 lb 3.2 oz (76.3 kg)   BMI 24.84 kg/m    Subjective:    Patient ID: Joseph Byrd, male    DOB: 10-Nov-1933, 85 y.o.   MRN: [id number]  HPI: Joseph Byrd is a 85 y.o. male presenting on 06/22/2018 for Hypertension (6 month follow up); Diabetes; and Hyperlipidemia   HPI Type 2 diabetes mellitus Patient comes in today for recheck of his diabetes. Patient has been currently taking no medication for diabetes currently and has been diet controlled. Patient is currently on an ACE inhibitor/ARB. Patient has not seen an ophthalmologist this year. Patient denies any issues with their feet.   Hypertension Patient is currently on amlodipine-valsartan-hydrochlorothiazide and doxazosin, and their blood pressure today is 106/62, discussed being lower and lightheadedness and dizziness but he denies any of that. Patient denies any lightheadedness or dizziness. Patient denies headaches, blurred vision, chest pains, shortness of breath, or weakness. Denies any side effects from medication and is content with current medication.   Hyperlipidemia Patient is coming in for recheck of his hyperlipidemia. The patient is currently taking Lipitor. They deny any issues with myalgias or history of liver damage from it. They deny any focal numbness or weakness or chest pain.  Patient has known CAD but has been stable and does see cardiology for this.  Patient's wife is concerned that he has been having some memory difficulties and they have been increasing, he thinks he is forgetting more things including where to turn and sometimes leaving things at a place.  Relevant past medical, surgical, family and social history reviewed and updated as indicated. Interim medical history since our last visit reviewed. Allergies and medications reviewed and updated.  Review of Systems  Constitutional: Negative for chills and fever.    Eyes: Negative for visual disturbance.  Respiratory: Negative for shortness of breath and wheezing.   Cardiovascular: Negative for chest pain and leg swelling.  Musculoskeletal: Negative for back pain and gait problem.  Skin: Negative for rash.  Neurological: Negative for dizziness, weakness, light-headedness and numbness.  Psychiatric/Behavioral: Positive for confusion. Negative for decreased concentration, dysphoric mood, self-injury, sleep disturbance and suicidal ideas.  All other systems reviewed and are negative.   Per HPI unless specifically indicated above   Allergies as of 06/22/2018   No Known Allergies     Medication List       Accurate as of June 22, 2018  9:48 AM. Always use your most recent med list.        amLODIPine-Valsartan-HCTZ 10-160-25 MG Tabs Take 1 tablet by mouth daily.   aspirin 81 MG tablet Take 81 mg by mouth daily.   atorvastatin 20 MG tablet Commonly known as:  LIPITOR Take 1 tablet (20 mg total) by mouth daily.   cilostazol 100 MG tablet Commonly known as:  PLETAL Take 1 tablet (100 mg total) by mouth 2 (two) times daily.   doxazosin 8 MG tablet Commonly known as:  CARDURA Take 1 tablet (8 mg total) by mouth at bedtime.   multivitamin with minerals Tabs tablet Take 1 tablet by mouth daily.   NITROSTAT 0.4 MG SL tablet Generic drug:  nitroGLYCERIN Place 0.4 mg under the tongue every 5 (five) minutes as needed for chest pain. Reported on 3/53/2992   ONETOUCH DELICA LANCETS 42A Misc CHECK BLOOD GLUCOSE DAILY   ONETOUCH VERIO test strip Generic  drug:  glucose blood TEST BLOOD SUGAR DAILY DX E11.9          Objective:    BP 106/62   Pulse 84   Temp (!) 97 F (36.1 C) (Oral)   Ht 5' 9" (1.753 m)   Wt 168 lb 3.2 oz (76.3 kg)   BMI 24.84 kg/m   Wt Readings from Last 3 Encounters:  06/22/18 168 lb 3.2 oz (76.3 kg)  04/08/18 170 lb (77.1 kg)  03/29/18 178 lb (80.7 kg)    Physical Exam Vitals signs and nursing note  reviewed.  Constitutional:      General: He is not in acute distress.    Appearance: He is well-developed. He is not diaphoretic.  Eyes:     General: No scleral icterus.       Right eye: No discharge.     Conjunctiva/sclera: Conjunctivae normal.     Pupils: Pupils are equal, round, and reactive to light.  Neck:     Musculoskeletal: Neck supple.     Thyroid: No thyromegaly.  Cardiovascular:     Rate and Rhythm: Normal rate and regular rhythm.     Heart sounds: Normal heart sounds. No murmur.  Pulmonary:     Effort: Pulmonary effort is normal. No respiratory distress.     Breath sounds: Normal breath sounds. No wheezing.  Musculoskeletal: Normal range of motion.  Lymphadenopathy:     Cervical: No cervical adenopathy.  Skin:    General: Skin is warm and dry.     Findings: No rash.  Neurological:     Mental Status: He is alert and oriented to person, place, and time.     Coordination: Coordination normal.  Psychiatric:        Behavior: Behavior normal.        Cognition and Memory: He exhibits impaired recent memory.         Assessment & Plan:   Problem List Items Addressed This Visit      Cardiovascular and Mediastinum   Essential hypertension, benign   Relevant Medications   amLODIPine-Valsartan-HCTZ 10-160-25 MG TABS   atorvastatin (LIPITOR) 20 MG tablet   doxazosin (CARDURA) 8 MG tablet   Other Relevant Orders   BMP8+EGFR   CAD, multiple vessel   Relevant Medications   amLODIPine-Valsartan-HCTZ 10-160-25 MG TABS   atorvastatin (LIPITOR) 20 MG tablet   doxazosin (CARDURA) 8 MG tablet     Endocrine   Type II diabetes mellitus (HCC) - Primary (Chronic)   Relevant Medications   amLODIPine-Valsartan-HCTZ 10-160-25 MG TABS   atorvastatin (LIPITOR) 20 MG tablet   Other Relevant Orders   Bayer DCA Hb A1c Waived (Completed)   BMP8+EGFR     Other   Hyperlipidemia with target LDL less than 70   Relevant Medications   amLODIPine-Valsartan-HCTZ 10-160-25 MG TABS    atorvastatin (LIPITOR) 20 MG tablet   doxazosin (CARDURA) 8 MG tablet   Overweight (BMI 25.0-29.9)    Other Visit Diagnoses    Memory difficulty       Relevant Orders   Ambulatory referral to Neurology     Patient's wife brings up memory concerns and he is agreeable to go see neurology for further testing  MMSE - Mini Mental State Exam 12/09/2017  Orientation to time 5  Orientation to Place 5  Registration 3  Attention/ Calculation 0  Recall 0  Language- name 2 objects 2  Language- repeat 1  Language- follow 3 step command 3  Language- read & follow direction 1  Write a sentence 1  Copy design 1  Total score 22     Follow up plan: Return in about 3 months (around 09/21/2018), or if symptoms worsen or fail to improve, for Diabetes and hypertension.  Counseling provided for all of the vaccine components Orders Placed This Encounter  Procedures  . Bayer DCA Hb A1c Waived  . BMP8+EGFR    Caryl Pina, MD [facility] 06/22/2018, 9:48 AM

## 2018-06-23 LAB — BMP8+EGFR
BUN/Creatinine Ratio: 17 (ref 10–24)
BUN: 20 mg/dL (ref 8–27)
CALCIUM: 9.4 mg/dL (ref 8.6–10.2)
CO2: 23 mmol/L (ref 20–29)
Chloride: 106 mmol/L (ref 96–106)
Creatinine, Ser: 1.15 mg/dL (ref 0.76–1.27)
GFR, EST AFRICAN AMERICAN: 68 mL/min/{1.73_m2} (ref >59)
GFR, EST NON AFRICAN AMERICAN: 59 mL/min/{1.73_m2} — AB (ref >59)
Glucose: 101 mg/dL — ABNORMAL HIGH (ref 65–99)
Potassium: 4.7 mmol/L (ref 3.5–5.2)
Sodium: 143 mmol/L (ref 134–144)

## 2018-06-30 DIAGNOSIS — I25119 Atherosclerotic heart disease of native coronary artery with unspecified angina pectoris: Secondary | ICD-10-CM | POA: Diagnosis not present

## 2018-06-30 DIAGNOSIS — Z8249 Family history of ischemic heart disease and other diseases of the circulatory system: Secondary | ICD-10-CM | POA: Diagnosis not present

## 2018-06-30 DIAGNOSIS — Z7982 Long term (current) use of aspirin: Secondary | ICD-10-CM | POA: Diagnosis not present

## 2018-06-30 DIAGNOSIS — E785 Hyperlipidemia, unspecified: Secondary | ICD-10-CM | POA: Diagnosis not present

## 2018-06-30 DIAGNOSIS — I10 Essential (primary) hypertension: Secondary | ICD-10-CM | POA: Diagnosis not present

## 2018-06-30 DIAGNOSIS — R69 Illness, unspecified: Secondary | ICD-10-CM | POA: Diagnosis not present

## 2018-06-30 DIAGNOSIS — Z87891 Personal history of nicotine dependence: Secondary | ICD-10-CM | POA: Diagnosis not present

## 2018-06-30 DIAGNOSIS — Z803 Family history of malignant neoplasm of breast: Secondary | ICD-10-CM | POA: Diagnosis not present

## 2018-06-30 DIAGNOSIS — R42 Dizziness and giddiness: Secondary | ICD-10-CM | POA: Diagnosis not present

## 2018-06-30 DIAGNOSIS — H04129 Dry eye syndrome of unspecified lacrimal gland: Secondary | ICD-10-CM | POA: Diagnosis not present

## 2018-07-06 ENCOUNTER — Ambulatory Visit (INDEPENDENT_AMBULATORY_CARE_PROVIDER_SITE_OTHER): Payer: Medicare HMO | Admitting: Cardiology

## 2018-07-06 ENCOUNTER — Encounter: Payer: Self-pay | Admitting: Cardiology

## 2018-07-06 VITALS — BP 137/81 | HR 72 | Ht 69.0 in | Wt 173.0 lb

## 2018-07-06 DIAGNOSIS — I447 Left bundle-branch block, unspecified: Secondary | ICD-10-CM

## 2018-07-06 DIAGNOSIS — I739 Peripheral vascular disease, unspecified: Secondary | ICD-10-CM

## 2018-07-06 DIAGNOSIS — I25110 Atherosclerotic heart disease of native coronary artery with unstable angina pectoris: Secondary | ICD-10-CM | POA: Diagnosis not present

## 2018-07-06 DIAGNOSIS — I10 Essential (primary) hypertension: Secondary | ICD-10-CM | POA: Diagnosis not present

## 2018-07-06 DIAGNOSIS — I209 Angina pectoris, unspecified: Secondary | ICD-10-CM

## 2018-07-06 DIAGNOSIS — I2510 Atherosclerotic heart disease of native coronary artery without angina pectoris: Secondary | ICD-10-CM

## 2018-07-06 HISTORY — DX: Left bundle-branch block, unspecified: I44.7

## 2018-07-06 NOTE — Progress Notes (Signed)
Subjective:   @Patient  ID: Joseph Byrd, male    DOB: [birth year], 85 y.o.   MRN: [id number]  Chief Complaint  Patient presents with  . Follow-up    3 month    HPI  Joseph Byrd is an 85 year old African-American male with a medical history significant for known coronary artery disease and angioplasty to his mid LAD in 2012 and to distal dominant circumflex coronary artery in 2015, hypertension, hyperlipidemia, symptoms suggestive mild PAD and claudication.   Patient is here for a 3 month follow-up for CAD, and PAD. Patient admits to having intermittent chest pain which is relieved by nitroglycerin. He has taken 1 nitroglycerin in the past 3 months for chest pain.   Does complain of mild discomfort in bilateral calves, left worse than the right but states that this is stable.  He is on chronic Pletal with improvement in symptoms.  Past Medical History:  Diagnosis Date  . Arthritis    "knees, wrists, knuckles" (04/26/2014)  . CAD (coronary artery disease)   . Headache    "had a couple/wk til ~ 2 wks ago" (04/26/2014)  . Hyperlipidemia   . Hypertension   . LBBB (left bundle branch block) 07/06/2018  . PAD (peripheral artery disease) ([place])   . Type II diabetes mellitus ([place])     Past Surgical History:  Procedure Laterality Date  . BACK SURGERY    . CATARACT EXTRACTION W/ INTRAOCULAR LENS  IMPLANT, BILATERAL Bilateral ~ 2014  . CORONARY ANGIOPLASTY WITH STENT PLACEMENT  08/2010; 04/26/2014   "1, LAD; 1"  . LEFT HEART CATHETERIZATION WITH CORONARY ANGIOGRAM N/A 04/26/2014   Procedure: LEFT HEART CATHETERIZATION WITH CORONARY ANGIOGRAM;  Surgeon: Laverda Page, MD;  Location: [facility] CATH LAB;  Service: Cardiovascular;  Laterality: N/A;  . LUMBAR LAMINECTOMY/DECOMPRESSION MICRODISCECTOMY  01/2010    Social History   Socioeconomic History  . Marital status: Married    Spouse name: Not on file  . Number of children: Not on file  . Years of education: Not on file    . Highest education level: Not on file  Occupational History  . Not on file  Social Needs  . Financial resource strain: Not hard at all  . Food insecurity:    Worry: Never true    Inability: Never true  . Transportation needs:    Medical: No    Non-medical: No  Tobacco Use  . Smoking status: Former Smoker    Packs/day: 0.12    Years: 10.00    Pack years: 1.20    Types: Cigarettes    Last attempt to quit: 09/04/1982    Years since quitting: 35.8  . Smokeless tobacco: Never Used  Substance and Sexual Activity  . Alcohol use: No  . Drug use: No  . Sexual activity: Yes  Lifestyle  . Physical activity:    Days per week: 7 days    Minutes per session: 20 min  . Stress: Not at all  Relationships  . Social connections:    Talks on phone: More than three times a week    Gets together: More than three times a week    Attends religious service: More than 4 times per year    Active member of club or organization: No    Attends meetings of clubs or organizations: Never    Relationship status: Married  . Intimate partner violence:    Fear of current or ex partner: No    Emotionally abused: No  Physically abused: No    Forced sexual activity: No  Other Topics Concern  . Not on file  Social History Narrative  . Not on file    Current Outpatient Medications on File Prior to Visit  Medication Sig Dispense Refill  . acetaminophen (TYLENOL) 500 MG tablet Take 500 mg by mouth every 6 (six) hours as needed.    Marland Kitchen amLODIPine-Valsartan-HCTZ 10-160-25 MG TABS Take 1 tablet by mouth daily.    Marland Kitchen aspirin 325 MG tablet Take 325 mg by mouth daily.    Marland Kitchen atorvastatin (LIPITOR) 20 MG tablet Take 1 tablet (20 mg total) by mouth daily. 90 tablet 3  . cilostazol (PLETAL) 100 MG tablet Take 1 tablet (100 mg total) by mouth 2 (two) times daily. 180 tablet 3  . doxazosin (CARDURA) 8 MG tablet Take 1 tablet (8 mg total) by mouth at bedtime. 90 tablet 3  . meclizine (ANTIVERT) 25 MG tablet Take 25 mg  by mouth 3 (three) times daily as needed for dizziness.    . Multiple Vitamin (MULTIVITAMIN WITH MINERALS) TABS tablet Take 1 tablet by mouth daily.    Marland Kitchen NITROSTAT 0.4 MG SL tablet Place 0.4 mg under the tongue every 5 (five) minutes as needed for chest pain. Reported on 08/15/2015  4   No current facility-administered medications on file prior to visit.      Review of Systems  Constitutional: Negative.   HENT: Negative for nosebleeds.   Eyes: Negative for blurred vision.  Cardiovascular: Positive for chest pain (intermittent). Negative for claudication (denies claudication symptoms since last OV).  Gastrointestinal: Negative for abdominal pain, nausea and vomiting.  Genitourinary: Negative for dysuria.  Musculoskeletal: Negative for myalgias.  Skin: Negative for itching and rash.  Neurological: Negative for dizziness and loss of consciousness.  Endo/Heme/Allergies: Does not bruise/bleed easily.  Psychiatric/Behavioral: The patient is not nervous/anxious.   All other systems reviewed and are negative.     Objective:  Vitals: Blood pressure 137/81, pulse 72, height 5\' 9"  (1.753 m), weight 173 lb (78.5 kg), SpO2 98 %.  Physical Exam  Constitutional: He appears well-developed and well-nourished.  HENT:  Head: Normocephalic.  Neck: Neck supple.  Cardiovascular: S1 normal and S2 normal. Exam reveals no gallop.  No murmur heard. Pulses:      Femoral pulses are 2+ on the right side and 2+ on the left side.      Popliteal pulses are 0 on the right side and 1+ on the left side.       Dorsalis pedis pulses are 0 on the right side and 0 on the left side.       Posterior tibial pulses are 0 on the right side and 0 on the left side.  Pulmonary/Chest: Effort normal and breath sounds normal. He has no wheezes. He has no rales.   Cardiac studies: LE arterial duplex 02/10/12:  Bilat ABI 0.92, improved from 0.6 Rt, 0.7 Lt on 02/25/11. Bilat dist SFA stenosis.  Coronary angio 04/26/2014:    Distal circumflex (dominant) 3.5x7mm Promus stent. mid LAD stent placed on 08/21/10 (IVUS guided 2.25x22 Resolute DES) widely patent. Mild lumiinal irregularities in other vessels.   Hospital Lexiscan Myoview stress test 03/20/2018:  There was no ST segment deviation noted during stress.  Findings consistent with prior inferior/basal inferoseptal/inferoapical myocardial infarction. There is no current ischemia.  This is a low to intermediate risk study based on prior scar and mildly decreased LVEF, there is no myocardium currently at jeopardy.  The left ventricular  ejection fraction is mildly decreased (48%).   Assessment & Recommendations:  Joseph Byrd is an African-American male with history of known coronary artery disease and angioplasty to his mid LAD in 2012 and to distal dominant circumflex coronary artery in 2015.  Past medical history also significant for hypertension, hyperlipidemia, symptoms suggestive mild PAD and claudication.   1.  Coronary artery disease with stable angina pectoris. EKG 01/09/2018: Sinus bradycardia at 50 bpm, one PAC, normal axis, no evidence of ischemia.  Normal EKG. 2.  Peripheral arterial disease with mild symptoms of claudication, symptoms improved on cilostazol and aspirin therapy. 3.  Mildly abnormal nuclear stress test revealing inferolateral scar with no ischemia, EF 48%. 4.  Hyperglycemia, physically does not need any diabetes medications. 5.  Hyperlipidemia, controlled, presently on atorvastatin. 6.  Hypertension  Recommendation: Patient is remained stable, his nuclear stress test performed 3 months ago was again reviewed with the patient and his wife.  No changes in the medications were done today.  Blood pressure is well controlled.  No changes in the medications were done today.  No change in his physical exam.  Unless symptoms of angina gets worse, then we will consider repeating angiography.  I'll see him back in 6 months.

## 2018-07-07 ENCOUNTER — Other Ambulatory Visit: Payer: Self-pay | Admitting: Cardiology

## 2018-07-07 ENCOUNTER — Other Ambulatory Visit: Payer: Self-pay

## 2018-07-08 ENCOUNTER — Other Ambulatory Visit: Payer: Self-pay | Admitting: Neurology

## 2018-07-08 ENCOUNTER — Other Ambulatory Visit (HOSPITAL_COMMUNITY): Payer: Self-pay | Admitting: Neurology

## 2018-07-08 DIAGNOSIS — R4182 Altered mental status, unspecified: Secondary | ICD-10-CM

## 2018-07-08 DIAGNOSIS — I10 Essential (primary) hypertension: Secondary | ICD-10-CM | POA: Diagnosis not present

## 2018-07-08 DIAGNOSIS — G301 Alzheimer's disease with late onset: Secondary | ICD-10-CM | POA: Diagnosis not present

## 2018-07-08 DIAGNOSIS — G43C1 Periodic headache syndromes in child or adult, intractable: Secondary | ICD-10-CM | POA: Diagnosis not present

## 2018-07-08 DIAGNOSIS — E7849 Other hyperlipidemia: Secondary | ICD-10-CM | POA: Diagnosis not present

## 2018-07-16 ENCOUNTER — Ambulatory Visit: Payer: Self-pay | Admitting: Cardiology

## 2018-07-17 ENCOUNTER — Ambulatory Visit (HOSPITAL_COMMUNITY)
Admission: RE | Admit: 2018-07-17 | Discharge: 2018-07-17 | Disposition: A | Discharge status: Home / Self Care | Payer: Medicare HMO | Source: Ambulatory Visit | Attending: Neurology | Admitting: Neurology

## 2018-07-17 DIAGNOSIS — R413 Other amnesia: Secondary | ICD-10-CM | POA: Diagnosis not present

## 2018-07-17 DIAGNOSIS — R4182 Altered mental status, unspecified: Secondary | ICD-10-CM | POA: Insufficient documentation

## 2018-07-17 DIAGNOSIS — D474 Osteomyelofibrosis: Secondary | ICD-10-CM | POA: Diagnosis not present

## 2018-07-17 DIAGNOSIS — Z79899 Other long term (current) drug therapy: Secondary | ICD-10-CM | POA: Diagnosis not present

## 2018-08-21 ENCOUNTER — Other Ambulatory Visit: Payer: Self-pay | Admitting: Family Medicine

## 2018-08-21 DIAGNOSIS — I10 Essential (primary) hypertension: Secondary | ICD-10-CM

## 2018-08-31 DIAGNOSIS — G301 Alzheimer's disease with late onset: Secondary | ICD-10-CM | POA: Diagnosis not present

## 2018-08-31 DIAGNOSIS — E7849 Other hyperlipidemia: Secondary | ICD-10-CM | POA: Diagnosis not present

## 2018-08-31 DIAGNOSIS — I10 Essential (primary) hypertension: Secondary | ICD-10-CM | POA: Diagnosis not present

## 2018-08-31 DIAGNOSIS — G43C1 Periodic headache syndromes in child or adult, intractable: Secondary | ICD-10-CM | POA: Diagnosis not present

## 2018-09-21 ENCOUNTER — Ambulatory Visit: Payer: Medicare HMO | Admitting: Family Medicine

## 2018-09-21 ENCOUNTER — Other Ambulatory Visit: Payer: Self-pay

## 2018-09-21 ENCOUNTER — Ambulatory Visit (INDEPENDENT_AMBULATORY_CARE_PROVIDER_SITE_OTHER): Payer: Medicare HMO | Admitting: Family Medicine

## 2018-09-21 ENCOUNTER — Encounter: Payer: Self-pay | Admitting: Family Medicine

## 2018-09-21 DIAGNOSIS — E785 Hyperlipidemia, unspecified: Secondary | ICD-10-CM

## 2018-09-21 DIAGNOSIS — F03C0 Unspecified dementia, severe, without behavioral disturbance, psychotic disturbance, mood disturbance, and anxiety: Secondary | ICD-10-CM | POA: Insufficient documentation

## 2018-09-21 DIAGNOSIS — R413 Other amnesia: Secondary | ICD-10-CM | POA: Diagnosis not present

## 2018-09-21 DIAGNOSIS — I10 Essential (primary) hypertension: Secondary | ICD-10-CM | POA: Diagnosis not present

## 2018-09-21 MED ORDER — ISOSORBIDE MONONITRATE ER 30 MG PO TB24: 30.0000 mg | ORAL_TABLET | Freq: Every day | ORAL | 3 refills | Status: DC

## 2018-09-21 NOTE — Progress Notes (Signed)
Virtual Visit via telephone Note  I connected with Joseph Byrd on 09/21/18 at [place] by telephone and verified that I am speaking with the correct person using two identifiers. Joseph Byrd is currently located at home and wife are currently with her during visit. The provider, [name] Harley Fitzwater, MD is located in their office at time of visit.  Call ended at 0855  I discussed the limitations, risks, security and privacy concerns of performing an evaluation and management service by telephone and the availability of in person appointments. I also discussed with the patient that there may be a patient responsible charge related to this service. The patient expressed understanding and agreed to proceed.   History and Present Illness: Hypertension Patient is currently on amlodipine-valsartan-hctz, doxazosin, imdur, and their blood pressure today is 119-120 systo. Patient denies any lightheadedness or dizziness. Patient denies headaches, blurred vision, chest pains, shortness of breath, or weakness. Denies any side effects from medication and is content with current medication.   Hyperlipidemia Patient is coming in for recheck of his hyperlipidemia. The patient is currently taking atorvastatin. They deny any issues with myalgias or history of liver damage from it. They deny any focal numbness or weakness or chest pain.   No diagnosis found.  Outpatient Encounter Medications as of 09/21/2018  Medication Sig  . acetaminophen (TYLENOL) 500 MG tablet Take 500 mg by mouth every 6 (six) hours as needed.  Marland Kitchen amLODIPine-Valsartan-HCTZ 10-160-25 MG TABS Take 1 tablet by mouth daily.  Marland Kitchen aspirin 325 MG tablet Take 325 mg by mouth daily.  Marland Kitchen atorvastatin (LIPITOR) 20 MG tablet Take 1 tablet (20 mg total) by mouth daily.  . cilostazol (PLETAL) 100 MG tablet Take 1 tablet (100 mg total) by mouth 2 (two) times daily.  Marland Kitchen doxazosin (CARDURA) 8 MG tablet Take 1 tablet (8 mg total) by mouth at bedtime.   . isosorbide mononitrate (IMDUR) 30 MG 24 hr tablet TAKE 1 TABLET BY MOUTH EVERY DAY  . meclizine (ANTIVERT) 25 MG tablet Take 25 mg by mouth 3 (three) times daily as needed for dizziness.  . Multiple Vitamin (MULTIVITAMIN WITH MINERALS) TABS tablet Take 1 tablet by mouth daily.  . nitroGLYCERIN (NITROSTAT) 0.4 MG SL tablet DISSOLVE 1 TABLET UNDER TONGUE EVERY 5 MIN AS NEEDED FOR CHEST PAIN, MAX 3 DOSES IN 15 MIN   No facility-administered encounter medications on file as of 09/21/2018.     Review of Systems  Constitutional: Negative for chills and fever.  Eyes: Negative for visual disturbance.  Respiratory: Negative for shortness of breath and wheezing.   Cardiovascular: Negative for chest pain and leg swelling.  Musculoskeletal: Negative for back pain and gait problem.  Skin: Negative for rash.  Psychiatric/Behavioral: Positive for confusion (memory impairment).  All other systems reviewed and are negative.   Observations/Objective: Patient sounds comfortable and in no acute distress   Assessment and Plan: Problem List Items Addressed This Visit      Cardiovascular and Mediastinum   Essential hypertension, benign   Relevant Medications   isosorbide mononitrate (IMDUR) 30 MG 24 hr tablet     Other   Hyperlipidemia with target LDL less than 70 - Primary   Relevant Medications   isosorbide mononitrate (IMDUR) 30 MG 24 hr tablet   Memory impairment       Follow Up Instructions:  Follow-up in 4 months for hypertension and cholesterol recheck and blood work  Patient sounds like he is doing well and we will not change any medication, he  did start doxazosin from his neurologist for memory but did not get a specific diagnosis for the memory yet at this point.   I discussed the assessment and treatment plan with the patient. The patient was provided an opportunity to ask questions and all were answered. The patient agreed with the plan and demonstrated an understanding of the  instructions.   The patient was advised to call back or seek an in-person evaluation if the symptoms worsen or if the condition fails to improve as anticipated.  The above assessment and management plan was discussed with the patient. The patient verbalized understanding of and has agreed to the management plan. Patient is aware to call the clinic if symptoms persist or worsen. Patient is aware when to return to the clinic for a follow-up visit. Patient educated on when it is appropriate to go to the emergency department.    I provided 10 minutes of non-face-to-face time during this encounter.    Worthy Rancher, MD

## 2018-10-05 ENCOUNTER — Other Ambulatory Visit: Payer: Self-pay

## 2018-10-05 ENCOUNTER — Telehealth: Payer: Self-pay | Admitting: *Deleted

## 2018-10-05 ENCOUNTER — Ambulatory Visit (INDEPENDENT_AMBULATORY_CARE_PROVIDER_SITE_OTHER): Payer: Medicare HMO | Admitting: Family Medicine

## 2018-10-05 ENCOUNTER — Encounter (HOSPITAL_COMMUNITY): Payer: Self-pay

## 2018-10-05 ENCOUNTER — Encounter: Payer: Self-pay | Admitting: Family Medicine

## 2018-10-05 ENCOUNTER — Emergency Department (HOSPITAL_COMMUNITY)
Admission: EM | Admit: 2018-10-05 | Discharge: 2018-10-05 | Disposition: A | Discharge status: Home / Self Care | Payer: Medicare HMO | Attending: Emergency Medicine | Admitting: Emergency Medicine

## 2018-10-05 DIAGNOSIS — I10 Essential (primary) hypertension: Secondary | ICD-10-CM | POA: Diagnosis not present

## 2018-10-05 DIAGNOSIS — Z79899 Other long term (current) drug therapy: Secondary | ICD-10-CM | POA: Diagnosis not present

## 2018-10-05 DIAGNOSIS — E119 Type 2 diabetes mellitus without complications: Secondary | ICD-10-CM | POA: Insufficient documentation

## 2018-10-05 DIAGNOSIS — R079 Chest pain, unspecified: Secondary | ICD-10-CM

## 2018-10-05 DIAGNOSIS — Z7982 Long term (current) use of aspirin: Secondary | ICD-10-CM | POA: Diagnosis not present

## 2018-10-05 DIAGNOSIS — Z87891 Personal history of nicotine dependence: Secondary | ICD-10-CM | POA: Diagnosis not present

## 2018-10-05 DIAGNOSIS — I952 Hypotension due to drugs: Secondary | ICD-10-CM | POA: Diagnosis not present

## 2018-10-05 DIAGNOSIS — I259 Chronic ischemic heart disease, unspecified: Secondary | ICD-10-CM | POA: Diagnosis not present

## 2018-10-05 DIAGNOSIS — I959 Hypotension, unspecified: Secondary | ICD-10-CM | POA: Diagnosis not present

## 2018-10-05 LAB — URINALYSIS, ROUTINE W REFLEX MICROSCOPIC
Bilirubin Urine: NEGATIVE
Glucose, UA: NEGATIVE mg/dL
Hgb urine dipstick: NEGATIVE
Ketones, ur: NEGATIVE mg/dL
Leukocytes,Ua: NEGATIVE
Nitrite: NEGATIVE
Protein, ur: NEGATIVE mg/dL
Specific Gravity, Urine: 1.023 (ref 1.005–1.030)
pH: 6 (ref 5.0–8.0)

## 2018-10-05 LAB — CBC WITH DIFFERENTIAL/PLATELET
Abs Immature Granulocytes: 0.01 10*3/uL (ref 0.00–0.07)
Basophils Absolute: 0 10*3/uL (ref 0.0–0.1)
Basophils Relative: 1 %
Eosinophils Absolute: 0.2 10*3/uL (ref 0.0–0.5)
Eosinophils Relative: 3 %
HCT: 35.7 % — ABNORMAL LOW (ref 39.0–52.0)
Hemoglobin: 11.1 g/dL — ABNORMAL LOW (ref 13.0–17.0)
Immature Granulocytes: 0 %
Lymphocytes Relative: 22 %
Lymphs Abs: 1.2 10*3/uL (ref 0.7–4.0)
MCH: 30.9 pg (ref 26.0–34.0)
MCHC: 31.1 g/dL (ref 30.0–36.0)
MCV: 99.4 fL (ref 80.0–100.0)
Monocytes Absolute: 0.4 10*3/uL (ref 0.1–1.0)
Monocytes Relative: 8 %
Neutro Abs: 3.4 10*3/uL (ref 1.7–7.7)
Neutrophils Relative %: 66 %
Platelets: 568 10*3/uL — ABNORMAL HIGH (ref 150–400)
RBC: 3.59 MIL/uL — ABNORMAL LOW (ref 4.22–5.81)
RDW: 12.9 % (ref 11.5–15.5)
WBC: 5.2 10*3/uL (ref 4.0–10.5)
nRBC: 0 % (ref 0.0–0.2)

## 2018-10-05 LAB — BASIC METABOLIC PANEL
Anion gap: 8 (ref 5–15)
BUN: 23 mg/dL (ref 8–23)
CO2: 26 mmol/L (ref 22–32)
Calcium: 8.7 mg/dL — ABNORMAL LOW (ref 8.9–10.3)
Chloride: 108 mmol/L (ref 98–111)
Creatinine, Ser: 1.26 mg/dL — ABNORMAL HIGH (ref 0.61–1.24)
GFR calc Af Amer: >60 mL/min (ref >60)
GFR calc non Af Amer: 53 mL/min — ABNORMAL LOW (ref >60)
Glucose, Bld: 109 mg/dL — ABNORMAL HIGH (ref 70–99)
Potassium: 3.4 mmol/L — ABNORMAL LOW (ref 3.5–5.1)
Sodium: 142 mmol/L (ref 135–145)

## 2018-10-05 LAB — TROPONIN I: Troponin I: <0.03 ng/mL (ref <0.03)

## 2018-10-05 NOTE — Progress Notes (Signed)
Virtual Visit via telephone Note Due to COVID-19, visit is conducted virtually and was requested by patient. This visit type was conducted due to national recommendations for restrictions regarding the COVID-19 Pandemic (e.g. social distancing) in an effort to limit this patient's exposure and mitigate transmission in our community. All issues noted in this document were discussed and addressed.  A physical exam was not performed with this format.   I connected with Joseph Byrd on 10/05/18 at 1538 by telephone and verified that I am speaking with the correct person using two identifiers. Joseph Byrd is currently located at home and family is currently with them during visit. The provider, Monia Pouch, FNP is located in their office at time of visit.  I discussed the limitations, risks, security and privacy concerns of performing an evaluation and management service by telephone and the availability of in person appointments. I also discussed with the patient that there may be a patient responsible charge related to this service. The patient expressed understanding and agreed to proceed.  Subjective:  Patient ID: Joseph Byrd, male    DOB: [birth year], 85 y.o.   MRN: [id number]  Chief Complaint:  Hypotension and Chest Pain   HPI: Joseph Byrd is a 85 y.o. male presenting on 10/05/2018 for Hypotension and Chest Pain   Wife reports pt has had hypotension all day. States his BP readings have been 83/46, 88/50, and is now 80/40. States he usually runs 110/80. She reports he has complained of left chest tightness intermittently today. He denies pain at present.   Chest Pain   This is a new problem. The current episode started today. The onset quality is sudden. The problem occurs intermittently. The problem has been waxing and waning. The pain is present in the substernal region (left). The pain is at a severity of 5/10. The pain is moderate. The quality of the pain is  described as tightness. The pain radiates to the left shoulder. Associated symptoms include weakness. Pertinent negatives include no back pain, diaphoresis, dizziness, fever, headaches, numbness, palpitations, shortness of breath, syncope or vomiting. He has tried nothing for the symptoms. Risk factors include sedentary lifestyle, male gender, lack of exercise and being elderly.  His past medical history is significant for arrhythmia, CAD, diabetes, hyperlipidemia, hypertension and PVD.  Pertinent negatives for past medical history include no seizures.     Relevant past medical, surgical, family, and social history reviewed and updated as indicated.  Allergies and medications reviewed and updated.   Past Medical History:  Diagnosis Date  . Arthritis    "knees, wrists, knuckles" (04/26/2014)  . CAD (coronary artery disease)   . Headache    "had a couple/wk til ~ 2 wks ago" (04/26/2014)  . Hyperlipidemia   . Hypertension   . LBBB (left bundle branch block) 07/06/2018  . PAD (peripheral artery disease) ([place])   . Type II diabetes mellitus ([place])     Past Surgical History:  Procedure Laterality Date  . BACK SURGERY    . CATARACT EXTRACTION W/ INTRAOCULAR LENS  IMPLANT, BILATERAL Bilateral ~ 2014  . CORONARY ANGIOPLASTY WITH STENT PLACEMENT  08/2010; 04/26/2014   "1, LAD; 1"  . LEFT HEART CATHETERIZATION WITH CORONARY ANGIOGRAM N/A 04/26/2014   Procedure: LEFT HEART CATHETERIZATION WITH CORONARY ANGIOGRAM;  Surgeon: Laverda Page, MD;  Location: [facility] CATH LAB;  Service: Cardiovascular;  Laterality: N/A;  . LUMBAR LAMINECTOMY/DECOMPRESSION MICRODISCECTOMY  01/2010    Social History   Socioeconomic History  . Marital status: Married  Spouse name: Not on file  . Number of children: Not on file  . Years of education: Not on file  . Highest education level: Not on file  Occupational History  . Not on file  Social Needs  . Financial resource strain: Not hard at all  . Food  insecurity:    Worry: Never true    Inability: Never true  . Transportation needs:    Medical: No    Non-medical: No  Tobacco Use  . Smoking status: Former Smoker    Packs/day: 0.12    Years: 10.00    Pack years: 1.20    Types: Cigarettes    Last attempt to quit: 09/04/1982    Years since quitting: 36.1  . Smokeless tobacco: Never Used  Substance and Sexual Activity  . Alcohol use: No  . Drug use: No  . Sexual activity: Yes  Lifestyle  . Physical activity:    Days per week: 7 days    Minutes per session: 20 min  . Stress: Not at all  Relationships  . Social connections:    Talks on phone: More than three times a week    Gets together: More than three times a week    Attends religious service: More than 4 times per year    Active member of club or organization: No    Attends meetings of clubs or organizations: Never    Relationship status: Married  . Intimate partner violence:    Fear of current or ex partner: No    Emotionally abused: No    Physically abused: No    Forced sexual activity: No  Other Topics Concern  . Not on file  Social History Narrative  . Not on file    Outpatient Encounter Medications as of 10/05/2018  Medication Sig  . acetaminophen (TYLENOL) 500 MG tablet Take 500 mg by mouth every 6 (six) hours as needed.  Marland Kitchen amLODIPine-Valsartan-HCTZ 10-160-25 MG TABS Take 1 tablet by mouth daily.  Marland Kitchen aspirin 325 MG tablet Take 325 mg by mouth daily.  Marland Kitchen atorvastatin (LIPITOR) 20 MG tablet Take 1 tablet (20 mg total) by mouth daily.  . cilostazol (PLETAL) 100 MG tablet Take 1 tablet (100 mg total) by mouth 2 (two) times daily.  Marland Kitchen donepezil (ARICEPT) 5 MG tablet Take 5 mg by mouth at bedtime.  Marland Kitchen doxazosin (CARDURA) 8 MG tablet Take 1 tablet (8 mg total) by mouth at bedtime.  . isosorbide mononitrate (IMDUR) 30 MG 24 hr tablet Take 1 tablet (30 mg total) by mouth daily.  . meclizine (ANTIVERT) 25 MG tablet Take 25 mg by mouth 3 (three) times daily as needed for  dizziness.  . Multiple Vitamin (MULTIVITAMIN WITH MINERALS) TABS tablet Take 1 tablet by mouth daily.  . nitroGLYCERIN (NITROSTAT) 0.4 MG SL tablet DISSOLVE 1 TABLET UNDER TONGUE EVERY 5 MIN AS NEEDED FOR CHEST PAIN, MAX 3 DOSES IN 15 MIN   No facility-administered encounter medications on file as of 10/05/2018.     No Known Allergies  Review of Systems  Constitutional: Positive for activity change. Negative for chills, diaphoresis, fatigue, fever and unexpected weight change.  Respiratory: Negative for shortness of breath.   Cardiovascular: Positive for chest pain. Negative for palpitations and syncope.  Gastrointestinal: Negative for vomiting.  Musculoskeletal: Negative for back pain.  Neurological: Positive for weakness. Negative for dizziness, tremors, seizures, syncope, facial asymmetry, speech difficulty, light-headedness, numbness and headaches.  All other systems reviewed and are negative.  Observations/Objective: No vital signs or physical exam, this was a telephone or virtual health encounter.  Pt alert and oriented, answers all questions appropriately, and able to speak in full sentences.    Assessment and Plan: Joseph Byrd was seen today for hypotension and chest pain.  Diagnoses and all orders for this visit:  Left-sided chest pain Hypotension, unspecified hypotension type Reported symptoms concerning for cardiac event. Will refer pt to ED for further evaluation and treatment. Wife agrees to this plan.     Follow Up Instructions: Pt to go to ED today for further evaluation and treatment.  I discussed the assessment and treatment plan with the patient. The patient was provided an opportunity to ask questions and all were answered. The patient agreed with the plan and demonstrated an understanding of the instructions.   The patient was advised to call back or seek an in-person evaluation if the symptoms worsen or if the condition fails to improve as anticipated.   The above assessment and management plan was discussed with the patient. The patient verbalized understanding of and has agreed to the management plan. Patient is aware to call the clinic if symptoms persist or worsen. Patient is aware when to return to the clinic for a follow-up visit. Patient educated on when it is appropriate to go to the emergency department.    I provided 15 minutes of non-face-to-face time during this encounter. The call started at 1538. The call ended at 1553. The other time was used for coordination of care.    Monia Pouch, FNP-C [facility] [address] [place], [place] [address] [phone]

## 2018-10-05 NOTE — ED Provider Notes (Signed)
[facility] EMERGENCY DEPARTMENT Provider Note   CSN: [id number] Arrival date & time: 10/05/18  1639    History   Chief Complaint Chief Complaint  Patient presents with  . Hypotension    HPI Joseph Byrd is a 85 y.o. male.     HPI   He presents for evaluation of low blood pressure.  This morning when he stood up he felt like he was "staggering."  He felt better when he sat down.  He contacted his PCP and was assessed by them by a telemetry medicine consultation.  He was sent here to be evaluated for possible cardiac condition.  He denies chest pain, back pain, shortness of breath, cough, headache, neck pain or back pain.  He has high blood pressure and is taking his usual medications today.  He reports he has been eating well, and ate both breakfast and lunch today.  He came here by private vehicle for evaluation.  He does not recall a prior similar problem.  There are no other known modifying factors.  Past Medical History:  Diagnosis Date  . Arthritis    "knees, wrists, knuckles" (04/26/2014)  . CAD (coronary artery disease)   . Headache    "had a couple/wk til ~ 2 wks ago" (04/26/2014)  . Hyperlipidemia   . Hypertension   . LBBB (left bundle branch block) 07/06/2018  . PAD (peripheral artery disease) ([place])   . Type II diabetes mellitus [facility])     Patient Active Problem List   Diagnosis Date Noted  . Memory impairment 09/21/2018  . Claudication in peripheral vascular disease ([place]) 07/06/2018  . Angina pectoris ([place]) 03/19/2018  . Overweight (BMI 25.0-29.9) 12/18/2017  . Anemia 12/16/2016  . Vitamin D deficiency 08/30/2014  . Postsurgical percutaneous transluminal coronary angioplasty (PTCA) status 04/26/2014  . Cataract   . Arthritis of both knees 01/05/2013  . Hyperglycemia 01/05/2013  . Essential hypertension, benign 08/22/2010  . CAD, multiple vessel 08/22/2010  . Hyperlipidemia with target LDL less than 70 08/22/2010    Past Surgical History:  Procedure  Laterality Date  . BACK SURGERY    . CATARACT EXTRACTION W/ INTRAOCULAR LENS  IMPLANT, BILATERAL Bilateral ~ 2014  . CORONARY ANGIOPLASTY WITH STENT PLACEMENT  08/2010; 04/26/2014   "1, LAD; 1"  . LEFT HEART CATHETERIZATION WITH CORONARY ANGIOGRAM N/A 04/26/2014   Procedure: LEFT HEART CATHETERIZATION WITH CORONARY ANGIOGRAM;  Surgeon: Laverda Page, MD;  Location: [facility] C[facility] LAB;  Service: Cardiovascular;  Laterality: N/A;  . LUMBAR LAMINECTOMY/DECOMPRESSION MICRODISCECTOMY  01/2010        Home Medications    Prior to Admission medications   Medication Sig Start Date End Date Taking? Authorizing Provider  acetaminophen (TYLENOL) 500 MG tablet Take 500 mg by mouth every 6 (six) hours as needed.    [provider]  amLODIPine-Valsartan-HCTZ 10-160-25 MG TABS Take 1 tablet by mouth daily. 05/08/18   [provider]  aspirin 325 MG tablet Take 325 mg by mouth daily.    [provider]  atorvastatin (LIPITOR) 20 MG tablet Take 1 tablet (20 mg total) by mouth daily. 06/22/18   Dettinger, [name], MD  cilostazol (PLETAL) 100 MG tablet Take 1 tablet (100 mg total) by mouth 2 (two) times daily. 06/22/18   Dettinger, [name], MD  donepezil (ARICEPT) 5 MG tablet Take 5 mg by mouth at bedtime.    [provider]  doxazosin (CARDURA) 8 MG tablet Take 1 tablet (8 mg total) by mouth at bedtime. 06/22/18  Dettinger, [name], MD  meclizine (ANTIVERT) 25 MG tablet Take 25 mg by mouth 3 (three) times daily as needed for dizziness.    [provider]  Multiple Vitamin (MULTIVITAMIN WITH MINERALS) TABS tablet Take 1 tablet by mouth daily.    [provider]  nitroGLYCERIN (NITROSTAT) 0.4 MG SL tablet DISSOLVE 1 TABLET UNDER TONGUE EVERY 5 MIN AS NEEDED FOR CHEST PAIN, MAX 3 DOSES IN 15 MIN 07/09/18   Miquel Dunn, NP    Family History No family history on file.  Social History Social History   Tobacco Use  . Smoking status: Former Smoker     Packs/day: 0.12    Years: 10.00    Pack years: 1.20    Types: Cigarettes    Last attempt to quit: 09/04/1982    Years since quitting: 36.1  . Smokeless tobacco: Never Used  Substance Use Topics  . Alcohol use: No  . Drug use: No     Allergies   Patient has no known allergies.   Review of Systems Review of Systems  All other systems reviewed and are negative.    Physical Exam Updated Vital Signs BP 112/80   Pulse (!) 57   Temp 98.4 F (36.9 C) (Oral)   Resp 19   Ht 5\' 9"  (1.753 m)   SpO2 98%   BMI 25.55 kg/m   Physical Exam Vitals signs and nursing note reviewed.  Constitutional:      General: He is not in acute distress.    Appearance: Normal appearance. He is well-developed. He is not ill-appearing, toxic-appearing or diaphoretic.  HENT:     Head: Normocephalic and atraumatic.     Right Ear: External ear normal.     Left Ear: External ear normal.     Mouth/Throat:     Pharynx: No oropharyngeal exudate or posterior oropharyngeal erythema.  Eyes:     Conjunctiva/sclera: Conjunctivae normal.     Pupils: Pupils are equal, round, and reactive to light.  Neck:     Musculoskeletal: Normal range of motion and neck supple.     Trachea: Phonation normal.  Cardiovascular:     Rate and Rhythm: Normal rate and regular rhythm.     Heart sounds: Normal heart sounds. No murmur.  Pulmonary:     Effort: Pulmonary effort is normal. No respiratory distress.     Breath sounds: Normal breath sounds. No stridor.  Abdominal:     General: There is no distension.     Palpations: Abdomen is soft.     Tenderness: There is no abdominal tenderness.  Musculoskeletal: Normal range of motion.        General: No swelling or tenderness.  Skin:    General: Skin is warm and dry.     Coloration: Skin is not jaundiced.  Neurological:     Mental Status: He is alert and oriented to person, place, and time.     Cranial Nerves: No cranial nerve deficit.     Sensory: No sensory deficit.      Motor: No abnormal muscle tone.     Coordination: Coordination normal.  Psychiatric:        Mood and Affect: Mood normal.        Behavior: Behavior normal.        Thought Content: Thought content normal.        Judgment: Judgment normal.      ED Treatments / Results  Labs (all labs ordered are listed, but only abnormal results are  displayed) Labs Reviewed  BASIC METABOLIC PANEL - Abnormal; Notable for the following components:      Result Value   Potassium 3.4 (*)    Glucose, Bld 109 (*)    Creatinine, Ser 1.26 (*)    Calcium 8.7 (*)    GFR calc non Af Amer 53 (*)    All other components within normal limits  CBC WITH DIFFERENTIAL/PLATELET - Abnormal; Notable for the following components:   RBC 3.59 (*)    Hemoglobin 11.1 (*)    HCT 35.7 (*)    Platelets 568 (*)    All other components within normal limits  TROPONIN I  URINALYSIS, ROUTINE W REFLEX MICROSCOPIC    EKG EKG Interpretation  Date/Time:  Monday Oct 05 2018 17:14:19 EDT Ventricular Rate:  64 PR Interval:    QRS Duration: 113 QT Interval:  425 QTC Calculation: 439 R Axis:   20 Text Interpretation:  Sinus arrhythmia Borderline intraventricular conduction delay Abnormal R-wave progression, early transition since last tracing no significant change Confirmed by Daleen Bo [phone]) on 10/05/2018 5:36:00 PM   Radiology No results found.  Procedures Procedures (including critical care time)  Medications Ordered in ED Medications - No data to display   Initial Impression / Assessment and Plan / ED Course  I have reviewed the triage vital signs and the nursing notes.  Pertinent labs & imaging results that were available during my care of the patient were reviewed by me and considered in my medical decision making (see chart for details).  Clinical Course as of Oct 05 1938  Mon Oct 05, 2018  1805 Troponin I - Once [EW]  1761 Basic metabolic panel(!) [EW]  6073 Normal  Troponin I - Once [EW]  1807  Normal except potassium low, glucose high, creatinine high, calcium low, GFR low  Basic metabolic panel(!) [EW]  7106 Normal except hemoglobin low, platelets high  CBC with Differential(!) [EW]    Clinical Course User Index [EW] Daleen Bo, MD        Patient Vitals for the past 24 hrs:  BP Temp Temp src Pulse Resp SpO2 Height  10/05/18 1930 112/80 - - (!) 57 19 98 % -  10/05/18 1915 117/73 - - 63 16 100 % -  10/05/18 1830 136/83 - - (!) 56 18 100 % -  10/05/18 1800 124/77 - - (!) 50 13 99 % -  10/05/18 1730 122/70 - - (!) 53 16 98 % -  10/05/18 1652 - - - - - 96 % 5\' 9"  (1.753 m)  10/05/18 1649 108/63 98.4 F (36.9 C) Oral 70 16 98 % -    7:40 PM Reevaluation with update and discussion. After initial assessment and treatment, an updated evaluation reveals he is comfortable has been able ambulate and drink fluids.  He has no further complaints.  Findings discussed and questions answered. [place] Making:.  Transient hypertension, without findings for significant abnormality.  Doubt ACS, PE, pneumonia or impending vascular collapse.  He is on 4 antihypertensive medications.  At this point will discontinue isosorbide, and continue other medications.  Recommend close follow-up with PCP for reevaluation.  CRITICAL CARE-no Performed by: Daleen Bo  Nursing Notes Reviewed/ Care Coordinated Applicable Imaging Reviewed Interpretation of Laboratory Data incorporated into ED treatment  The patient appears reasonably screened and/or stabilized for discharge and I doubt any other medical condition or other [facility] requiring further screening, evaluation, or treatment in the ED at this time prior to  discharge.  Plan: Home Medications-continue all medicines except Imdur; Home Treatments-rest, fluids, regular meals; return here if the recommended treatment, does not improve the symptoms; Recommended follow up-PCP, checkup in 1 week.   Final Clinical Impressions(s) /  ED Diagnoses   Final diagnoses:  Hypotension due to drugs    ED Discharge Orders    None       Daleen Bo, MD 10/05/18 1941

## 2018-10-05 NOTE — Discharge Instructions (Addendum)
Your blood pressure was low earlier today but improved after a period of time.  At this point we recommend stopping the isosorbide dinitrate (Imdur).  Continue eating and drinking regularly.  Follow-up with your doctor for an evaluation of your blood pressure and see if you need to stop some of your other medicines, or change them.  Return here if needed.

## 2018-10-05 NOTE — Patient Instructions (Addendum)
Pt to go to ED for further evaluation and treatment

## 2018-10-05 NOTE — ED Triage Notes (Signed)
Pt was instructed to come  To ED by PCP due to reports by of low BP with sys in 80's. Pt reports he feels unsteady when he got up this morning  And at some point has chest pain , but denies any pain at present

## 2018-10-19 NOTE — Telephone Encounter (Signed)
appt scheduled

## 2018-10-27 DIAGNOSIS — G43C1 Periodic headache syndromes in child or adult, intractable: Secondary | ICD-10-CM | POA: Diagnosis not present

## 2018-10-27 DIAGNOSIS — E7849 Other hyperlipidemia: Secondary | ICD-10-CM | POA: Diagnosis not present

## 2018-10-27 DIAGNOSIS — I10 Essential (primary) hypertension: Secondary | ICD-10-CM | POA: Diagnosis not present

## 2018-10-27 DIAGNOSIS — G301 Alzheimer's disease with late onset: Secondary | ICD-10-CM | POA: Diagnosis not present

## 2019-01-01 ENCOUNTER — Other Ambulatory Visit: Payer: Self-pay | Admitting: Cardiology

## 2019-01-06 ENCOUNTER — Other Ambulatory Visit: Payer: Self-pay

## 2019-01-06 ENCOUNTER — Ambulatory Visit (INDEPENDENT_AMBULATORY_CARE_PROVIDER_SITE_OTHER): Payer: Medicare HMO | Admitting: Cardiology

## 2019-01-06 ENCOUNTER — Encounter: Payer: Self-pay | Admitting: Cardiology

## 2019-01-06 VITALS — BP 108/62 | HR 86 | Ht 69.0 in | Wt 172.0 lb

## 2019-01-06 DIAGNOSIS — E7800 Pure hypercholesterolemia, unspecified: Secondary | ICD-10-CM

## 2019-01-06 DIAGNOSIS — I739 Peripheral vascular disease, unspecified: Secondary | ICD-10-CM

## 2019-01-06 DIAGNOSIS — I25118 Atherosclerotic heart disease of native coronary artery with other forms of angina pectoris: Secondary | ICD-10-CM

## 2019-01-06 DIAGNOSIS — I10 Essential (primary) hypertension: Secondary | ICD-10-CM | POA: Diagnosis not present

## 2019-01-06 NOTE — Progress Notes (Signed)
Primary Physician/Referring:  Dettinger, [name], MD  Patient ID: Joseph Byrd, male    DOB: [birth year], 85 y.o.   MRN: [id number]  Chief Complaint  Patient presents with  . Coronary Artery Disease  . PAD  . Follow-up   HPI:    HPI: Joseph Byrd  is a 85 y.o. [name] is an 85 year old African-American male with a medical history significant for known coronary artery disease and angioplasty to his mid LAD in 2012 and to distal dominant circumflex coronary artery in 2015, hypertension, hyperlipidemia, symptoms suggestive mild PAD and claudication.   Past Medical History:  Diagnosis Date  . Arthritis    "knees, wrists, knuckles" (04/26/2014)  . CAD (coronary artery disease)   . Headache    "had a couple/wk til ~ 2 wks ago" (04/26/2014)  . Hyperlipidemia   . Hypertension   . LBBB (left bundle branch block) 07/06/2018  . PAD (peripheral artery disease) ([place])   . Type II diabetes mellitus ([place])    Past Surgical History:  Procedure Laterality Date  . BACK SURGERY    . CATARACT EXTRACTION W/ INTRAOCULAR LENS  IMPLANT, BILATERAL Bilateral ~ 2014  . CORONARY ANGIOPLASTY WITH STENT PLACEMENT  08/2010; 04/26/2014   "1, LAD; 1"  . LEFT HEART CATHETERIZATION WITH CORONARY ANGIOGRAM N/A 04/26/2014   Procedure: LEFT HEART CATHETERIZATION WITH CORONARY ANGIOGRAM;  Surgeon: Laverda Page, MD;  Location: [facility] C[facility] LAB;  Service: Cardiovascular;  Laterality: N/A;  . LUMBAR LAMINECTOMY/DECOMPRESSION MICRODISCECTOMY  01/2010   Social History   Socioeconomic History  . Marital status: Married    Spouse name: Not on file  . Number of children: 6  . Years of education: Not on file  . Highest education level: Not on file  Occupational History  . Not on file  Social Needs  . Financial resource strain: Not hard at all  . Food insecurity    Worry: Never true    Inability: Never true  . Transportation needs    Medical: No    Non-medical: No  Tobacco Use  . Smoking  status: Former Smoker    Packs/day: 0.12    Years: 10.00    Pack years: 1.20    Types: Cigarettes    Quit date: 09/04/1982    Years since quitting: 36.3  . Smokeless tobacco: Never Used  Substance and Sexual Activity  . Alcohol use: No  . Drug use: No  . Sexual activity: Yes  Lifestyle  . Physical activity    Days per week: 7 days    Minutes per session: 20 min  . Stress: Not at all  Relationships  . Social connections    Talks on phone: More than three times a week    Gets together: More than three times a week    Attends religious service: More than 4 times per year    Active member of club or organization: No    Attends meetings of clubs or organizations: Never    Relationship status: Married  . Intimate partner violence    Fear of current or ex partner: No    Emotionally abused: No    Physically abused: No    Forced sexual activity: No  Other Topics Concern  . Not on file  Social History Narrative  . Not on file   ROS  Review of Systems  Constitution: Negative for chills, decreased appetite, malaise/fatigue and weight gain.  Eyes: Visual halos: stable angina.  Cardiovascular: Positive for chest pain  and claudication (mild and stable, left leg worse). Negative for dyspnea on exertion, leg swelling and syncope.  Endocrine: Negative for cold intolerance.  Hematologic/Lymphatic: Does not bruise/bleed easily.  Musculoskeletal: Negative for joint swelling.  Gastrointestinal: Negative for abdominal pain, anorexia, change in bowel habit, hematochezia and melena.  Neurological: Negative for headaches and light-headedness.  Psychiatric/Behavioral: Negative for depression and substance abuse.  All other systems reviewed and are negative.  Objective  Blood pressure 108/62, pulse 86, height 5\' 9"  (1.753 m), weight 172 lb (78 kg), SpO2 95 %. Body mass index is 25.4 kg/m.   Physical Exam  Constitutional: He appears well-developed and well-nourished.  HENT:  Head:  Normocephalic.  Neck: Neck supple.  Cardiovascular: S1 normal and S2 normal. Exam reveals no gallop.  No murmur heard. Pulses:      Carotid pulses are 2+ on the right side and 2+ on the left side.      Femoral pulses are 2+ on the right side and 2+ on the left side.      Popliteal pulses are 0 on the right side and 1+ on the left side.       Dorsalis pedis pulses are 0 on the right side and 0 on the left side.       Posterior tibial pulses are 0 on the right side and 0 on the left side.  No edema.   Pulmonary/Chest: Effort normal and breath sounds normal. He has no wheezes. He has no rales.   Radiology: No results found.  Laboratory examination:   CMP Latest Ref Rng & Units 10/05/2018 06/22/2018 03/29/2018  Glucose 70 - 99 mg/dL 109(H) 101(H) 113(H)  BUN 8 - 23 mg/dL 23 20 23   Creatinine 0.61 - 1.24 mg/dL 1.26(H) 1.15 1.16  Sodium 135 - 145 mmol/L 142 143 139  Potassium 3.5 - 5.1 mmol/L 3.4(L) 4.7 4.1  Chloride 98 - 111 mmol/L 108 106 106  CO2 22 - 32 mmol/L 26 23 26   Calcium 8.9 - 10.3 mg/dL 8.7(L) 9.4 9.1  Total Protein 6.5 - 8.1 g/dL - - 7.7  Total Bilirubin 0.3 - 1.2 mg/dL - - 1.2  Alkaline Phos 38 - 126 U/L - - 53  AST 15 - 41 U/L - - 26  ALT 0 - 44 U/L - - 15   CBC Latest Ref Rng & Units 10/05/2018 03/29/2018 03/19/2018  WBC 4.0 - 10.5 K/uL 5.2 5.2 5.0  Hemoglobin 13.0 - 17.0 g/dL 11.1(L) 12.3(L) 12.3(L)  Hematocrit 39.0 - 52.0 % 35.7(L) 39.4 39.8  Platelets 150 - 400 K/uL 568(H) 567(H) 511(H)   Lipid Panel     Component Value Date/Time   CHOL 127 03/20/2018 0516   CHOL 122 06/19/2017 1038   CHOL 130 09/03/2012 1629   TRIG 32 03/20/2018 0516   TRIG 60 08/27/2013 1056   TRIG 66 09/03/2012 1629   HDL 49 03/20/2018 0516   HDL 54 06/19/2017 1038   HDL 47 08/27/2013 1056   HDL 46 09/03/2012 1629   CHOLHDL 2.6 03/20/2018 0516   VLDL 6 03/20/2018 0516   LDLCALC 72 03/20/2018 0516   LDLCALC 58 06/19/2017 1038   LDLCALC 67 08/27/2013 1056   LDLCALC 71 09/03/2012 1629    HEMOGLOBIN A1C Lab Results  Component Value Date   HGBA1C 5.0 06/22/2018   MPG 82.45 03/19/2018   TSH Recent Labs    03/19/18 1230  TSH 1.042   Medications   Current Outpatient Medications  Medication Instructions  . acetaminophen (TYLENOL) 500 mg,  Oral, Every 6 hours PRN  . amLODIPine-Valsartan-HCTZ 10-160-25 MG TABS 1 tablet, Oral, Daily  . aspirin 325 mg, Oral, Daily  . atorvastatin (LIPITOR) 20 mg, Oral, Daily  . cilostazol (PLETAL) 100 mg, Oral, 2 times daily  . donepezil (ARICEPT) 5 mg, Oral, Daily at bedtime  . doxazosin (CARDURA) 8 mg, Oral, Daily at bedtime  . meclizine (ANTIVERT) 25 mg, Oral, 3 times daily PRN  . Multiple Vitamin (MULTIVITAMIN WITH MINERALS) TABS tablet 1 tablet, Oral, Daily  . nitroGLYCERIN (NITROSTAT) 0.4 MG SL tablet DISSOLVE 1 TABLET UNDER TONGUE EVERY 5 MIN AS NEEDED FOR CHEST PAIN, MAX 3 DOSES IN 15 MIN    Cardiac Studies:   Cardiac studies: LE arterial duplex 02/10/12:  Bilat ABI 0.92, improved from 0.6 Rt, 0.7 Lt on 02/25/11. Bilat dist SFA stenosis.  Coronary angio 04/26/2014:  Distal circumflex (dominant) 3.5x33mm Promus stent. mid LAD stent placed on 08/21/10 (IVUS guided 2.25x22 Resolute DES) widely patent. Mild lumiinal irregularities in other vessels.   Hospital Lexiscan Myoview stress test 03/20/2018:  There was no ST segment deviation noted during stress.  Findings consistent with prior inferior/basal inferoseptal/inferoapical myocardial infarction. There is no current ischemia.  This is a low to intermediate risk study based on prior scar and mildly decreased LVEF, there is no myocardium currently at jeopardy.  The left ventricular ejection fraction is mildly decreased (48%).   Assessment     ICD-10-CM   1. Coronary artery disease of native artery of native heart with stable angina pectoris ([place])  I25.118 EKG 12-Lead  2. Essential hypertension  I10   3. Hypercholesteremia  E78.00   4. Claudication in peripheral vascular  disease ([place])  I73.9     EKG 01/06/2019: Normal sinus rhythm at the rate of 82 bpm, normal axis, incomplete right bundle branch block.  No evidence of ischemia.  Baseline artifact.   EKG 01/09/2018: Sinus bradycardia at 50 bpm, one PAC, normal axis, no evidence of ischemia.  Normal EKG.  Recommendations:   Patient is here on a six-month office visit and follow-up of coronary artery disease and peripheral arterial disease.  He has chronic stable angina pectoris, class II, continue present medical therapy, presently on appropriate her. His lipids were reviewed, lipids and excellent control.  Blood pressure is also under excellent control.  Is presently on dual antiplatelet therapy with cilostazol and also aspirin and he has not had any bleeding diathesis on the same and symptoms of claudication are very mild.  No change in his physical exam from 6 months ago.  As he remained stable from cardiac standpoint, I'll continue to see him back on an annual basis.  His wife present over the phone and I have discussed with her and all questions answered as well.  He has chronic mild anemia which is remained stable, also has thrombocytosis.  Hence would recommend continuing Pletal and aspirin as well.  Adrian Prows, MD, [facility] 01/06/2019, 1:57 PM [facility] [place] Pager: [phone] Office: [phone] If no answer Cell [phone]

## 2019-01-14 ENCOUNTER — Other Ambulatory Visit: Payer: Self-pay

## 2019-01-14 MED ORDER — AMLODIPINE-VALSARTAN-HCTZ 10-160-25 MG PO TABS: 1.0000 | ORAL_TABLET | Freq: Every day | ORAL | 1 refills | Status: DC

## 2019-01-14 NOTE — Telephone Encounter (Signed)
Requesting a refill of amlodipine. States he has been getting it from his heart doctor. Please advise

## 2019-01-28 ENCOUNTER — Ambulatory Visit (INDEPENDENT_AMBULATORY_CARE_PROVIDER_SITE_OTHER): Payer: Medicare HMO | Admitting: *Deleted

## 2019-01-28 DIAGNOSIS — Z0000 Encounter for general adult medical examination without abnormal findings: Secondary | ICD-10-CM | POA: Diagnosis not present

## 2019-01-28 NOTE — Patient Instructions (Signed)
Preventive Care 75 Years and Older, Male Preventive care refers to lifestyle choices and visits with your health care provider that can promote health and wellness. This includes:  A yearly physical exam. This is also called an annual well check.  Regular dental and eye exams.  Immunizations.  Screening for certain conditions.  Healthy lifestyle choices, such as diet and exercise. What can I expect for my preventive care visit? Physical exam Your health care provider will check:  Height and weight. These may be used to calculate body mass index (BMI), which is a measurement that tells if you are at a healthy weight.  Heart rate and blood pressure.  Your skin for abnormal spots. Counseling Your health care provider may ask you questions about:  Alcohol, tobacco, and drug use.  Emotional well-being.  Home and relationship well-being.  Sexual activity.  Eating habits.  History of falls.  Memory and ability to understand (cognition).  Work and work Statistician. What immunizations do I need?  Influenza (flu) vaccine  This is recommended every year. Tetanus, diphtheria, and pertussis (Tdap) vaccine  You may need a Td booster every 10 years. Varicella (chickenpox) vaccine  You may need this vaccine if you have not already been vaccinated. Zoster (shingles) vaccine  You may need this after age 50. Pneumococcal conjugate (PCV13) vaccine  One dose is recommended after age 24. Pneumococcal polysaccharide (PPSV23) vaccine  One dose is recommended after age 33. Measles, mumps, and rubella (MMR) vaccine  You may need at least one dose of MMR if you were born in 1957 or later. You may also need a second dose. Meningococcal conjugate (MenACWY) vaccine  You may need this if you have certain conditions. Hepatitis A vaccine  You may need this if you have certain conditions or if you travel or work in places where you may be exposed to hepatitis A. Hepatitis B vaccine   You may need this if you have certain conditions or if you travel or work in places where you may be exposed to hepatitis B. Haemophilus influenzae type b (Hib) vaccine  You may need this if you have certain conditions. You may receive vaccines as individual doses or as more than one vaccine together in one shot (combination vaccines). Talk with your health care provider about the risks and benefits of combination vaccines. What tests do I need? Blood tests  Lipid and cholesterol levels. These may be checked every 5 years, or more frequently depending on your overall health.  Hepatitis C test.  Hepatitis B test. Screening  Lung cancer screening. You may have this screening every year starting at age 74 if you have a 30-pack-year history of smoking and currently smoke or have quit within the past 15 years.  Colorectal cancer screening. All adults should have this screening starting at age 57 and continuing until age 54. Your health care provider may recommend screening at age 47 if you are at increased risk. You will have tests every 1-10 years, depending on your results and the type of screening test.  Prostate cancer screening. Recommendations will vary depending on your family history and other risks.  Diabetes screening. This is done by checking your blood sugar (glucose) after you have not eaten for a while (fasting). You may have this done every 1-3 years.  Abdominal aortic aneurysm (AAA) screening. You may need this if you are a current or former smoker.  Sexually transmitted disease (STD) testing. Follow these instructions at home: Eating and drinking  Eat  a diet that includes fresh fruits and vegetables, whole grains, lean protein, and low-fat dairy products. Limit your intake of foods with high amounts of sugar, saturated fats, and salt.  Take vitamin and mineral supplements as recommended by your health care provider.  Do not drink alcohol if your health care provider  tells you not to drink.  If you drink alcohol: ? Limit how much you have to 0-2 drinks a day. ? Be aware of how much alcohol is in your drink. In the [place] one drink equals one 12 oz bottle of beer (355 mL), one 5 oz glass of wine (148 mL), or one 1 oz glass of hard liquor (44 mL). Lifestyle  Take daily care of your teeth and gums.  Stay active. Exercise for at least 30 minutes on 5 or more days each week.  Do not use any products that contain nicotine or tobacco, such as cigarettes, e-cigarettes, and chewing tobacco. If you need help quitting, ask your health care provider.  If you are sexually active, practice safe sex. Use a condom or other form of protection to prevent STIs (sexually transmitted infections).  Talk with your health care provider about taking a low-dose aspirin or statin. What's next?  Visit your health care provider once a year for a well check visit.  Ask your health care provider how often you should have your eyes and teeth checked.  Stay up to date on all vaccines. This information is not intended to replace advice given to you by your health care provider. Make sure you discuss any questions you have with your health care provider. Document Released: 06/16/2015 Document Revised: 05/14/2018 Document Reviewed: 05/14/2018 Elsevier Patient Education  2020 Elsevier Inc.  

## 2019-01-28 NOTE — Progress Notes (Signed)
MEDICARE ANNUAL WELLNESS VISIT  01/28/2019  Telephone Visit Disclaimer This Medicare AWV was conducted by telephone due to national recommendations for restrictions regarding the COVID-19 Pandemic (e.g. social distancing).  I verified, using two identifiers, that I am speaking with Joseph Byrd or their authorized healthcare agent. I discussed the limitations, risks, security, and privacy concerns of performing an evaluation and management service by telephone and the potential availability of an in-person appointment in the future. The patient expressed understanding and agreed to proceed.   Subjective:  Joseph Byrd is a 85 y.o. male patient of Dettinger, Joseph Kaufmann, MD who had a Medicare Annual Wellness Visit today via telephone. [name] is Retired and lives with their spouse. he has 6 children. he reports that he is socially active and does interact with friends/family regularly. he is minimally physically active and enjoys gardening.  Patient Care Team: Dettinger, Joseph Kaufmann, MD as PCP - General (Family Medicine) Adrian Prows, MD as PCP - Cardiology (Cardiology) Adrian Prows, MD as Attending Physician (Cardiology) [place], Heather, [place] (Optometry)  Advanced Directives 01/28/2019 03/29/2018 03/19/2018 03/19/2018 12/15/2016 04/26/2014  Does Patient Have a Medical Advance Directive? No No No No No No  Would patient like information on creating a medical advance directive? No - Patient declined No - Patient declined No - Patient declined No - Patient declined No - Patient declined No - patient declined information    Hospital Utilization Over the Past 12 Months: # of hospitalizations or ER visits: 3 # of surgeries: 0  Review of Systems    Patient reports that his overall health is unchanged compared to last year.  Patient Reported Readings (BP, Pulse, CBG, Weight, etc) none  Review of Systems: History obtained from chart review  All other systems negative.  Pain  Assessment Pain : No/denies pain     Current Medications & Allergies (verified) Allergies as of 01/28/2019   No Known Allergies     Medication List       Accurate as of January 28, 2019  9:09 AM. If you have any questions, ask your nurse or doctor.        acetaminophen 500 MG tablet Commonly known as: TYLENOL Take 500 mg by mouth every 6 (six) hours as needed.   amLODIPine-Valsartan-HCTZ 10-160-25 MG Tabs Take 1 tablet by mouth daily.   aspirin 325 MG tablet Take 325 mg by mouth daily.   atorvastatin 20 MG tablet Commonly known as: LIPITOR Take 1 tablet (20 mg total) by mouth daily.   cilostazol 100 MG tablet Commonly known as: PLETAL Take 1 tablet (100 mg total) by mouth 2 (two) times daily.   donepezil 5 MG tablet Commonly known as: ARICEPT Take 5 mg by mouth at bedtime.   doxazosin 8 MG tablet Commonly known as: CARDURA Take 1 tablet (8 mg total) by mouth at bedtime.   meclizine 25 MG tablet Commonly known as: ANTIVERT Take 25 mg by mouth 3 (three) times daily as needed for dizziness.   multivitamin with minerals Tabs tablet Take 1 tablet by mouth daily.   nitroGLYCERIN 0.4 MG SL tablet Commonly known as: NITROSTAT DISSOLVE 1 TABLET UNDER TONGUE EVERY 5 MIN AS NEEDED FOR CHEST PAIN, MAX 3 DOSES IN 15 MIN       History (reviewed): Past Medical History:  Diagnosis Date   Arthritis    "knees, wrists, knuckles" (04/26/2014)   CAD (coronary artery disease)    Headache    "had a couple/wk til ~ 2 wks ago" (  04/26/2014)   Hyperlipidemia    Hypertension    LBBB (left bundle branch block) 07/06/2018   PAD (peripheral artery disease) ([place])    Type II diabetes mellitus ([place])    Past Surgical History:  Procedure Laterality Date   BACK SURGERY     CATARACT EXTRACTION W/ INTRAOCULAR LENS  IMPLANT, BILATERAL Bilateral ~ 2014   CORONARY ANGIOPLASTY WITH STENT PLACEMENT  08/2010; 04/26/2014   "1, LAD; 1"   LEFT HEART CATHETERIZATION WITH CORONARY  ANGIOGRAM N/A 04/26/2014   Procedure: LEFT HEART CATHETERIZATION WITH CORONARY ANGIOGRAM;  Surgeon: Laverda Page, MD;  Location: [facility] CATH LAB;  Service: Cardiovascular;  Laterality: N/A;   LUMBAR LAMINECTOMY/DECOMPRESSION MICRODISCECTOMY  01/2010   Family History  Problem Relation Age of Onset   Cancer Sister    Social History   Socioeconomic History   Marital status: Married    Spouse name: Joseph Byrd   Number of children: 6   Years of education: 3   Highest education level: 3rd grade  Occupational History   Occupation: retired  Scientist, product/process development strain: Not hard at [employer]    Worry: Never true    Inability: Never true   Transportation needs    Medical: No    Non-medical: No  Tobacco Use   Smoking status: Former Smoker    Packs/day: 0.12    Years: 10.00    Pack years: 1.20    Types: Cigarettes    Quit date: 09/04/1982    Years since quitting: 36.4   Smokeless tobacco: Never Used  Substance and Sexual Activity   Alcohol use: No   Drug use: No   Sexual activity: Yes  Lifestyle   Physical activity    Days per week: 7 days    Minutes per session: 20 min   Stress: Not at all  Relationships   Social connections    Talks on phone: More than three times a week    Gets together: More than three times a week    Attends religious service: More than 4 times per year    Active member of club or organization: No    Attends meetings of clubs or organizations: Never    Relationship status: Married  Other Topics Concern   Not on file  Social History Narrative   Not on file    Activities of Daily Living In your present state of health, do you have any difficulty performing the following activities: 01/28/2019 03/19/2018  Hearing? N N  Vision? N N  Comment pt does get yearly eye exam -  Difficulty concentrating or making decisions? Y N  Comment sometimes he has memory problems but is taking Aricept -  Walking or climbing  stairs? N N  Dressing or bathing? N N  Doing errands, shopping? Y N  Comment his wife drives him to all appointments and does all the grocery shopping -  Preparing Food and eating ? N -  Using the Toilet? N -  In the past six months, have you accidently leaked urine? N -  Do you have problems with loss of bowel control? N -  Managing your Medications? Y -  Comment wife gets all of his medications and seperattes them and puts them in a pillbox -  Managing your Finances? Y -  Comment his wife manages all the finances -  Housekeeping or managing your Housekeeping? Y -  Comment his wife does most of the housekeeping -  Some recent  data might be hidden    Patient Education/ Literacy How often do you need to have someone help you when you read instructions, pamphlets, or other written materials from your doctor or pharmacy?: 1 - Never What is the last grade level you completed in school?: 3rd grade  Exercise Current Exercise Habits: Home exercise routine, Time (Minutes): 20, Frequency (Times/Week): 7, Weekly Exercise (Minutes/Week): 140, Intensity: Mild, Exercise limited by: cardiac condition(s)  Diet Patient reports consuming 3 meals a day and 0 snack(s) a day Patient reports that his primary diet is: Regular Patient reports that she does have regular access to food.   Depression Screen PHQ 2/9 Scores 01/28/2019 06/22/2018 12/18/2017 06/19/2017 12/16/2016 06/17/2016 03/13/2016  PHQ - 2 Score 0 0 0 0 0 0 0     Fall Risk Fall Risk  01/28/2019 06/22/2018 12/18/2017 06/19/2017 12/16/2016  Falls in the past year? 0 0 No No No  Number falls in past yr: 0 - - - -  Injury with Fall? 0 - - - -  Follow up Falls prevention discussed - - - -  Comment get rid of all throw rugs in the house, adequate lighting in walkways and grabrails in the bathroom - - - -     Objective:  Joseph Byrd seemed alert and oriented and he participated appropriately during our telephone visit.  Blood Pressure Weight  BMI  BP Readings from Last 3 Encounters:  01/06/19 108/62  10/05/18 112/80  07/06/18 137/81   Wt Readings from Last 3 Encounters:  01/06/19 172 lb (78 kg)  07/06/18 173 lb (78.5 kg)  06/22/18 168 lb 3.2 oz (76.3 kg)   BMI Readings from Last 1 Encounters:  01/06/19 25.40 kg/m    *Unable to obtain current vital signs, weight, and BMI due to telephone visit type  Hearing/Vision   Jenny did not seem to have difficulty with hearing/understanding during the telephone conversation  Reports that he has not had a formal eye exam by an eye care professional within the past year  Reports that he has not had a formal hearing evaluation within the past year *Unable to fully assess hearing and vision during telephone visit type  Cognitive Function: 6CIT Screen 01/28/2019  What Year? 0 points  What month? 0 points  What time? 0 points  Count back from 20 4 points  Months in reverse 4 points  Repeat phrase 4 points  Total Score 12   (Normal:0-7, Significant for Dysfunction: >8)  Normal Cognitive Function Screening: No: but this was expected and pt is currently taking Aricept   Immunization & Health Maintenance Record Immunization History  Administered Date(s) Administered   Influenza, High Dose Seasonal PF 04/07/2015, 03/20/2016, 03/24/2017, 03/20/2018   Influenza,inj,Quad PF,6+ Mos 03/04/2013, 04/27/2014   Pneumococcal Conjugate-13 02/14/2015   Pneumococcal-Unspecified 11/01/2008   Tdap 08/30/2014   Zoster 11/29/2013    Health Maintenance  Topic Date Due   OPHTHALMOLOGY EXAM  11/02/2015   HEMOGLOBIN A1C  12/21/2018   INFLUENZA VACCINE  01/02/2019   FOOT EXAM  06/23/2019   TETANUS/TDAP  08/29/2024   PNA vac Low Risk Adult  Completed       Assessment  This is a routine wellness examination for Joseph Byrd.  Health Maintenance: Due or Overdue Health Maintenance Due  Topic Date Due   OPHTHALMOLOGY EXAM  11/02/2015   HEMOGLOBIN A1C  12/21/2018    INFLUENZA VACCINE  01/02/2019    Joseph Byrd does not need a referral for Community Assistance: Care Management:  no Social Work:    no Prescription Assistance:  no Nutrition/Diabetes Education:  no   Plan:  Personalized Goals Goals Addressed            This Visit's Progress    DIET - INCREASE WATER INTAKE       Try to drink 6-8 glasses of water daily.      Personalized Health Maintenance & Screening Recommendations  Influenza vaccine Eye Exam  Lung Cancer Screening Recommended: no (Low Dose CT Chest recommended if Age 56-80 years, 30 pack-year currently smoking OR have quit w/in past 15 years) Hepatitis C Screening recommended: no HIV Screening recommended: no  Advanced Directives: Written information was not prepared per patient's request.  Referrals & Orders No orders of the defined types were placed in this encounter.   Follow-up Plan  Follow-up with Dettinger, Joseph Kaufmann, MD as planned  Schedule your eye exam as discussed  Get your Flu shot at your next visit with your PCP   I have personally reviewed and noted the following in the patients chart:    Medical and social history  Use of alcohol, tobacco or illicit drugs   Current medications and supplements  Functional ability and status  Nutritional status  Physical activity  Advanced directives  List of other physicians  Hospitalizations, surgeries, and ER visits in previous 12 months  Vitals  Screenings to include cognitive, depression, and falls  Referrals and appointments  In addition, I have reviewed and discussed with Joseph Byrd certain preventive protocols, quality metrics, and best practice recommendations. A written personalized care plan for preventive services as well as general preventive health recommendations is available and can be mailed to the patient at his request.      Marylin Crosby, LPN  [id number]

## 2019-02-02 ENCOUNTER — Other Ambulatory Visit: Payer: Self-pay

## 2019-02-03 ENCOUNTER — Encounter: Payer: Self-pay | Admitting: Family Medicine

## 2019-02-03 ENCOUNTER — Ambulatory Visit (INDEPENDENT_AMBULATORY_CARE_PROVIDER_SITE_OTHER): Payer: Medicare HMO | Admitting: Family Medicine

## 2019-02-03 ENCOUNTER — Other Ambulatory Visit: Payer: Self-pay

## 2019-02-03 VITALS — BP 128/71 | HR 72 | Temp 98.2°F | Ht 69.0 in | Wt 172.0 lb

## 2019-02-03 DIAGNOSIS — Z23 Encounter for immunization: Secondary | ICD-10-CM

## 2019-02-03 DIAGNOSIS — E785 Hyperlipidemia, unspecified: Secondary | ICD-10-CM

## 2019-02-03 DIAGNOSIS — I499 Cardiac arrhythmia, unspecified: Secondary | ICD-10-CM | POA: Diagnosis not present

## 2019-02-03 DIAGNOSIS — I10 Essential (primary) hypertension: Secondary | ICD-10-CM

## 2019-02-03 DIAGNOSIS — E663 Overweight: Secondary | ICD-10-CM

## 2019-02-03 NOTE — Progress Notes (Signed)
BP 128/71   Pulse 72   Temp 98.2 F (36.8 C) (Temporal)   Ht '5\' 9"'  (1.753 m)   Wt 172 lb (78 kg)   BMI 25.40 kg/m    Subjective:   Patient ID: Joseph Byrd, male    DOB: 04-04-1934, 85 y.o.   MRN: [id number]  HPI: Jaber Dunlow is a 85 y.o. male presenting on 02/03/2019 for  Hypertension   HPI Hypertension Patient is currently on amlodipine-valsartan-hydrochlorothiazide and doxazosin, and their blood pressure today is 28/71. Patient denies any lightheadedness or dizziness. Patient denies headaches, blurred vision, chest pains, shortness of breath, or weakness. Denies any side effects from medication and is content with current medication.   Hyperlipidemia Patient is coming in for recheck of his hyperlipidemia. The patient is currently taking atorvastatin. They deny any issues with myalgias or history of liver damage from it. They deny any focal numbness or weakness or chest pain.   Patient has an irregular heart rhythm on exam but he denies any palpitations or flutters and he does have a cardiologist.  Relevant past medical, surgical, family and social history reviewed and updated as indicated. Interim medical history since our last visit reviewed. Allergies and medications reviewed and updated.  Review of Systems  Constitutional: Negative for chills and fever.  Respiratory: Negative for shortness of breath and wheezing.   Cardiovascular: Negative for chest pain and leg swelling.  Musculoskeletal: Negative for back pain and gait problem.  Skin: Negative for rash.  Neurological: Negative for dizziness, weakness and light-headedness.  All other systems reviewed and are negative.   Per HPI unless specifically indicated above   Allergies as of 02/03/2019   No Known Allergies     Medication List       Accurate as of February 03, 2019  9:46 AM. If you have any questions, ask your nurse or doctor.        acetaminophen 500 MG tablet Commonly known as: TYLENOL  Take 500 mg by mouth every 6 (six) hours as needed.   amLODIPine-Valsartan-HCTZ 10-160-25 MG Tabs Take 1 tablet by mouth daily.   aspirin 325 MG tablet Take 325 mg by mouth daily.   atorvastatin 20 MG tablet Commonly known as: LIPITOR Take 1 tablet (20 mg total) by mouth daily.   cilostazol 100 MG tablet Commonly known as: PLETAL Take 1 tablet (100 mg total) by mouth 2 (two) times daily.   donepezil 5 MG tablet Commonly known as: ARICEPT Take 5 mg by mouth at bedtime.   doxazosin 8 MG tablet Commonly known as: CARDURA Take 1 tablet (8 mg total) by mouth at bedtime.   meclizine 25 MG tablet Commonly known as: ANTIVERT Take 25 mg by mouth 3 (three) times daily as needed for dizziness.   multivitamin with minerals Tabs tablet Take 1 tablet by mouth daily.   nitroGLYCERIN 0.4 MG SL tablet Commonly known as: NITROSTAT DISSOLVE 1 TABLET UNDER TONGUE EVERY 5 MIN AS NEEDED FOR CHEST PAIN, MAX 3 DOSES IN 15 MIN        Objective:   BP 128/71   Pulse 72   Temp 98.2 F (36.8 C) (Temporal)   Ht '5\' 9"'  (1.753 m)   Wt 172 lb (78 kg)   BMI 25.40 kg/m   Wt Readings from Last 3 Encounters:  02/03/19 172 lb (78 kg)  01/06/19 172 lb (78 kg)  07/06/18 173 lb (78.5 kg)    Physical Exam Vitals signs and nursing note reviewed.  Constitutional:  General: He is not in acute distress.    Appearance: He is well-developed. He is not diaphoretic.  Eyes:     General: No scleral icterus.    Conjunctiva/sclera: Conjunctivae normal.  Neck:     Musculoskeletal: Neck supple.     Thyroid: No thyromegaly.  Cardiovascular:     Rate and Rhythm: Normal rate. Rhythm irregular.     Heart sounds: Normal heart sounds. No murmur.  Pulmonary:     Effort: Pulmonary effort is normal. No respiratory distress.     Breath sounds: Normal breath sounds. No wheezing.  Musculoskeletal: Normal range of motion.  Lymphadenopathy:     Cervical: No cervical adenopathy.  Skin:    General: Skin is  warm and dry.     Findings: No rash.  Neurological:     Mental Status: He is alert and oriented to person, place, and time.     Coordination: Coordination normal.  Psychiatric:        Behavior: Behavior normal.       Assessment & Plan:   Problem List Items Addressed This Visit      Cardiovascular and Mediastinum   Essential hypertension, benign - Primary   Relevant Orders   CMP14+EGFR   CBC with Differential/Platelet     Other   Hyperlipidemia with target LDL less than 70   Relevant Orders   Lipid panel   Overweight (BMI 25.0-29.9)    Other Visit Diagnoses    Irregular cardiac rhythm       Sounds like likely a PVC every now and then, has a cardiologist and has had a recent EKG, just will follow up with them.   Relevant Orders   CMP14+EGFR   CBC with Differential/Platelet      Continue current blood pressure medication and cholesterol medication, no changes, follow-up with cardiology, will see back in 6 months Follow up plan: Return in about 6 months (around 08/03/2019), or if symptoms worsen or fail to improve, for Hypertension and cholesterol.  Counseling provided for all of the vaccine components No orders of the defined types were placed in this encounter.   Caryl Pina, MD [facility] 02/03/2019, 9:46 AM

## 2019-02-11 ENCOUNTER — Ambulatory Visit: Payer: Self-pay | Admitting: Cardiology

## 2019-02-23 DIAGNOSIS — E7849 Other hyperlipidemia: Secondary | ICD-10-CM | POA: Diagnosis not present

## 2019-02-23 DIAGNOSIS — I10 Essential (primary) hypertension: Secondary | ICD-10-CM | POA: Diagnosis not present

## 2019-02-23 DIAGNOSIS — G43C1 Periodic headache syndromes in child or adult, intractable: Secondary | ICD-10-CM | POA: Diagnosis not present

## 2019-02-23 DIAGNOSIS — G301 Alzheimer's disease with late onset: Secondary | ICD-10-CM | POA: Diagnosis not present

## 2019-02-25 ENCOUNTER — Telehealth: Payer: Self-pay | Admitting: Family Medicine

## 2019-02-25 NOTE — Telephone Encounter (Signed)
appt scheduled for R knee pain Pt notified

## 2019-02-26 ENCOUNTER — Other Ambulatory Visit: Payer: Self-pay

## 2019-02-26 ENCOUNTER — Ambulatory Visit (INDEPENDENT_AMBULATORY_CARE_PROVIDER_SITE_OTHER): Payer: Medicare HMO | Admitting: Family Medicine

## 2019-02-26 ENCOUNTER — Encounter: Payer: Self-pay | Admitting: Family Medicine

## 2019-02-26 VITALS — BP 119/67 | HR 75 | Temp 98.6°F | Ht 69.0 in | Wt 175.0 lb

## 2019-02-26 DIAGNOSIS — M25461 Effusion, right knee: Secondary | ICD-10-CM

## 2019-02-26 NOTE — Patient Instructions (Addendum)
Get a compression sleeve to keep fluid down.  You could consider VOLTAREN gel over the counter if needed.  Knee Effusion  Knee effusion means that you have extra fluid in your knee. This can cause pain. Your knee may be more difficult to bend and move. What are the causes? Common causes are:  Arthritis.  Infection.  Injury.  Autoimmune disease. This means that your body's defense system (immune system) mistakenly attacks healthy body tissues. Follow these instructions at home: Medicines  Take medicines only as told by your doctor.  Do not drive or use heavy machinery while taking prescription pain medicine.  If you are taking prescription pain medicine, take actions to help prevent or treat trouble pooping (constipation). Your doctor may recommend that you: ? Drink enough fluid to keep your pee (urine) pale yellow. ? Eat foods that are high in fiber. These include fresh fruits and vegetables, whole grains, and beans. ? Avoid eating fatty or sweet foods. ? Take a medicine for constipation. If you have a brace:  Wear the brace as told by your doctor. Remove it only as told by your doctor.  Loosen the brace if your toes tingle, become numb, or turn cold and blue.  Keep the brace clean.  If the brace is not waterproof: ? Do not let it get wet. ? Cover it with a watertight covering when you take a bath or a shower. Managing pain, stiffness, and swelling   If told, apply ice to the swollen area: ? If you have a removable brace, remove it as told by your doctor. ? Put ice in a plastic bag. ? Place a towel between your skin and the bag. ? Leave the ice on for 20 minutes, 2-3 times per day.  Keep your knee raised (elevated) when you are sitting or lying down. General instructions  Do not use any products that contain nicotine or tobacco, such as cigarettes and e-cigarettes. These can delay healing. If you need help quitting, ask your doctor.  Use crutches as told by your  doctor.  Do exercises as told by your doctor.  Rest as told by your doctor.  Keep all follow-up visits as told by your doctor. This is important. Contact a doctor if you:  Continue to have pain in your knee. Get help right away if you:  Have swelling or redness of your knee that gets worse or does not get better.  Have serious pain in your knee.  Have a fever. Summary  Knee effusion is when you have extra fluid in your knee. This causes pain and swelling and makes it hard to bend and move your knee.  Take medicines only as told by your doctor.  If you have a brace, wear the brace as told by your doctor. This information is not intended to replace advice given to you by your health care provider. Make sure you discuss any questions you have with your health care provider. Document Released: 06/22/2010 Document Revised: 06/02/2017 Document Reviewed: 06/01/2017 Elsevier Patient Education  2020 [employer].

## 2019-02-26 NOTE — Progress Notes (Signed)
Subjective: CC: knee swelling PCP: Dettinger, [name], MD [id number] Joseph Byrd is a 85 y.o. male presenting to clinic today for:  1. Knee swelling Patient reports 1 day history of knee swelling.  He has had similar but much worse in the past and it needed to be drained.  He denies any pain, instability.  No preceding injury.  No treatments since onset yesterday.  It is slightly better compared to yesterday.   ROS: Per HPI  No Known Allergies Past Medical History:  Diagnosis Date  . Arthritis    "knees, wrists, knuckles" (04/26/2014)  . CAD (coronary artery disease)   . Headache    "had a couple/wk til ~ 2 wks ago" (04/26/2014)  . Hyperlipidemia   . Hypertension   . LBBB (left bundle branch block) 07/06/2018  . PAD (peripheral artery disease) ([place])   . Type II diabetes mellitus (HCC)     Current Outpatient Medications:  .  acetaminophen (TYLENOL) 500 MG tablet, Take 500 mg by mouth every 6 (six) hours as needed., Disp: , Rfl:  .  amLODIPine-Valsartan-HCTZ 10-160-25 MG TABS, Take 1 tablet by mouth daily., Disp: 90 tablet, Rfl: 1 .  aspirin 325 MG tablet, Take 325 mg by mouth daily., Disp: , Rfl:  .  atorvastatin (LIPITOR) 20 MG tablet, Take 1 tablet (20 mg total) by mouth daily., Disp: 90 tablet, Rfl: 3 .  cilostazol (PLETAL) 100 MG tablet, Take 1 tablet (100 mg total) by mouth 2 (two) times daily., Disp: 180 tablet, Rfl: 3 .  donepezil (ARICEPT) 5 MG tablet, Take 5 mg by mouth at bedtime., Disp: , Rfl:  .  doxazosin (CARDURA) 8 MG tablet, Take 1 tablet (8 mg total) by mouth at bedtime., Disp: 90 tablet, Rfl: 3 .  meclizine (ANTIVERT) 25 MG tablet, Take 25 mg by mouth 3 (three) times daily as needed for dizziness., Disp: , Rfl:  .  Multiple Vitamin (MULTIVITAMIN WITH MINERALS) TABS tablet, Take 1 tablet by mouth daily., Disp: , Rfl:  .  nitroGLYCERIN (NITROSTAT) 0.4 MG SL tablet, DISSOLVE 1 TABLET UNDER TONGUE EVERY 5 MIN AS NEEDED FOR CHEST PAIN, MAX 3 DOSES IN 15 MIN, Disp: 25  tablet, Rfl: 1 Social History   Socioeconomic History  . Marital status: Married    Spouse name: Clarise Cruz  . Number of children: 6  . Years of education: 3  . Highest education level: 3rd grade  Occupational History  . Occupation: retired  Scientific laboratory technician  . Financial resource strain: Not hard at all  . Food insecurity    Worry: Never true    Inability: Never true  . Transportation needs    Medical: No    Non-medical: No  Tobacco Use  . Smoking status: Former Smoker    Packs/day: 0.12    Years: 10.00    Pack years: 1.20    Types: Cigarettes    Quit date: 09/04/1982    Years since quitting: 36.5  . Smokeless tobacco: Never Used  Substance and Sexual Activity  . Alcohol use: No  . Drug use: No  . Sexual activity: Yes  Lifestyle  . Physical activity    Days per week: 7 days    Minutes per session: 20 min  . Stress: Not at all  Relationships  . Social connections    Talks on phone: More than three times a week    Gets together: More than three times a week    Attends religious service: More than 4 times per year  Active member of club or organization: No    Attends meetings of clubs or organizations: Never    Relationship status: Married  . Intimate partner violence    Fear of current or ex partner: No    Emotionally abused: No    Physically abused: No    Forced sexual activity: No  Other Topics Concern  . Not on file  Social History Narrative  . Not on file   Family History  Problem Relation Age of Onset  . Cancer Sister     Objective: Office vital signs reviewed. BP 119/67   Pulse 75   Temp 98.6 F (37 C) (Temporal)   Ht 5\' 9"  (1.753 m)   Wt 175 lb (79.4 kg)   BMI 25.84 kg/m   Physical Examination:  General: Awake, alert, well nourished, No acute distress MSK: Ambulating independently and without difficulty.  Right knee: Very mild joint effusion noted.  No tenderness palpation to the patella, patellar tendon, quad tendon or joint line.  No ligamentous  laxity.  No palpable posterior popliteal masses.  He has full active range of motion.  Assessment/ Plan: 85 y.o. male   1. Effusion of right knee Of uncertain etiology.  Does not appear to be in pain and therefore no oral medications prescribed.  I recommended a knee sleeve for compression.  Continue to stay physically active to promote fluid movement.  We discussed consideration of topical Voltaren gel if needed.  Would avoid oral NSAIDs given recent creatinine level being elevated.  Home care instructions reviewed and reasons for return discussed.  If starts to have pain or joint swelling worsens, advised him to follow-up in the office versus referral to orthopedist.   No orders of the defined types were placed in this encounter.  No orders of the defined types were placed in this encounter.    Janora Norlander, DO [place] [phone]

## 2019-04-27 ENCOUNTER — Emergency Department (HOSPITAL_COMMUNITY)
Admission: EM | Admit: 2019-04-27 | Discharge: 2019-04-27 | Disposition: A | Discharge status: Home / Self Care | Payer: Medicare HMO | Attending: Emergency Medicine | Admitting: Emergency Medicine

## 2019-04-27 ENCOUNTER — Other Ambulatory Visit: Payer: Self-pay

## 2019-04-27 ENCOUNTER — Emergency Department (HOSPITAL_COMMUNITY): Payer: Medicare HMO

## 2019-04-27 ENCOUNTER — Encounter (HOSPITAL_COMMUNITY): Payer: Self-pay | Admitting: Emergency Medicine

## 2019-04-27 DIAGNOSIS — R0789 Other chest pain: Secondary | ICD-10-CM | POA: Insufficient documentation

## 2019-04-27 DIAGNOSIS — R079 Chest pain, unspecified: Secondary | ICD-10-CM | POA: Diagnosis not present

## 2019-04-27 DIAGNOSIS — E119 Type 2 diabetes mellitus without complications: Secondary | ICD-10-CM | POA: Diagnosis not present

## 2019-04-27 DIAGNOSIS — I2510 Atherosclerotic heart disease of native coronary artery without angina pectoris: Secondary | ICD-10-CM | POA: Insufficient documentation

## 2019-04-27 DIAGNOSIS — Z87891 Personal history of nicotine dependence: Secondary | ICD-10-CM | POA: Insufficient documentation

## 2019-04-27 DIAGNOSIS — Z79899 Other long term (current) drug therapy: Secondary | ICD-10-CM | POA: Diagnosis not present

## 2019-04-27 DIAGNOSIS — I10 Essential (primary) hypertension: Secondary | ICD-10-CM | POA: Insufficient documentation

## 2019-04-27 DIAGNOSIS — Z7982 Long term (current) use of aspirin: Secondary | ICD-10-CM | POA: Insufficient documentation

## 2019-04-27 LAB — BASIC METABOLIC PANEL
Anion gap: 6 (ref 5–15)
BUN: 20 mg/dL (ref 8–23)
CO2: 28 mmol/L (ref 22–32)
Calcium: 8.7 mg/dL — ABNORMAL LOW (ref 8.9–10.3)
Chloride: 102 mmol/L (ref 98–111)
Creatinine, Ser: 1.1 mg/dL (ref 0.61–1.24)
GFR calc Af Amer: >60 mL/min (ref >60)
GFR calc non Af Amer: >60 mL/min (ref >60)
Glucose, Bld: 112 mg/dL — ABNORMAL HIGH (ref 70–99)
Potassium: 3.8 mmol/L (ref 3.5–5.1)
Sodium: 136 mmol/L (ref 135–145)

## 2019-04-27 LAB — CBC
HCT: 39.6 % (ref 39.0–52.0)
Hemoglobin: 11.8 g/dL — ABNORMAL LOW (ref 13.0–17.0)
MCH: 29.7 pg (ref 26.0–34.0)
MCHC: 29.8 g/dL — ABNORMAL LOW (ref 30.0–36.0)
MCV: 99.7 fL (ref 80.0–100.0)
Platelets: 604 10*3/uL — ABNORMAL HIGH (ref 150–400)
RBC: 3.97 MIL/uL — ABNORMAL LOW (ref 4.22–5.81)
RDW: 13 % (ref 11.5–15.5)
WBC: 5.8 10*3/uL (ref 4.0–10.5)
nRBC: 0 % (ref 0.0–0.2)

## 2019-04-27 LAB — TROPONIN I (HIGH SENSITIVITY)
Troponin I (High Sensitivity): 7 ng/L (ref <18)
Troponin I (High Sensitivity): 8 ng/L (ref <18)

## 2019-04-27 NOTE — ED Triage Notes (Signed)
PT c/o middle sharp chest pain that lasted about 4 minutes this am. PT denies any pain or complaints since then.

## 2019-04-27 NOTE — ED Provider Notes (Signed)
[facility] EMERGENCY DEPARTMENT Provider Note   CSN: [id number] Arrival date & time: 04/27/19  1326     History   Chief Complaint Chief Complaint  Patient presents with  . Chest Pain    HPI Joseph Byrd is a 85 y.o. male past medical story of CAD, hyperlipidemia, hypertension, PAD, type 2 diabetes who presents for evaluation of an episode of chest pain that began at 10 AM this morning.  He reports that he was sitting down at his house and he had a brief, sharp chest pain on his left side of his chest.  He states it did not radiate.  Not associated with any shortness of breath, diaphoresis, nausea/vomiting.  He states it lasted a few seconds and then resolved on its own.  He did not take any medication for the pain.  He reports that since then, he has not had any chest pain but his wife wanted him to be checked out.  He states he has not had any recent fevers, chills, cough, difficulty breathing, abdominal pain, nausea/vomiting.  He is not a current smoker.  He does have history of hypertension and diabetes.  He reports history of cardiovascular disease but no personal MI.  He states members of his family have had heart attacks but none before the age of 57.  HPI: A 85 year old patient with a history of peripheral artery disease, treated diabetes, hypertension and hypercholesterolemia presents for evaluation of chest pain. Initial onset of pain was approximately 3-6 hours ago. The patient's chest pain is sharp and is not worse with exertion. The patient's chest pain is middle- or left-sided, is not well-localized, is not described as heaviness/pressure/tightness and does not radiate to the arms/jaw/neck. The patient does not complain of nausea and denies diaphoresis. The patient has no history of stroke, has not smoked in the past 90 days, has no relevant family history of coronary artery disease (first degree relative at less than age 58) and does not have an elevated BMI (>=30).   The  history is provided by the patient.    Past Medical History:  Diagnosis Date  . Arthritis    "knees, wrists, knuckles" (04/26/2014)  . CAD (coronary artery disease)   . Headache    "had a couple/wk til ~ 2 wks ago" (04/26/2014)  . Hyperlipidemia   . Hypertension   . LBBB (left bundle branch block) 07/06/2018  . PAD (peripheral artery disease) ([place])   . Type II diabetes mellitus [facility])     Patient Active Problem List   Diagnosis Date Noted  . Memory impairment 09/21/2018  . Claudication in peripheral vascular disease ([place]) 07/06/2018  . Angina pectoris ([place]) 03/19/2018  . Overweight (BMI 25.0-29.9) 12/18/2017  . Anemia 12/16/2016  . Vitamin D deficiency 08/30/2014  . Postsurgical percutaneous transluminal coronary angioplasty (PTCA) status 04/26/2014  . Cataract   . Arthritis of both knees 01/05/2013  . Hyperglycemia 01/05/2013  . Essential hypertension, benign 08/22/2010  . CAD, multiple vessel 08/22/2010  . Hyperlipidemia with target LDL less than 70 08/22/2010    Past Surgical History:  Procedure Laterality Date  . BACK SURGERY    . CATARACT EXTRACTION W/ INTRAOCULAR LENS  IMPLANT, BILATERAL Bilateral ~ 2014  . CORONARY ANGIOPLASTY WITH STENT PLACEMENT  08/2010; 04/26/2014   "1, LAD; 1"  . LEFT HEART CATHETERIZATION WITH CORONARY ANGIOGRAM N/A 04/26/2014   Procedure: LEFT HEART CATHETERIZATION WITH CORONARY ANGIOGRAM;  Surgeon: Laverda Page, MD;  Location: [facility] CATH LAB;  Service: Cardiovascular;  Laterality:  N/A;  . LUMBAR LAMINECTOMY/DECOMPRESSION MICRODISCECTOMY  01/2010        Home Medications    Prior to Admission medications   Medication Sig Start Date End Date Taking? Authorizing Provider  amLODIPine-Valsartan-HCTZ 10-160-25 MG TABS Take 1 tablet by mouth daily. 01/14/19  Yes Dettinger, [name], MD  aspirin 325 MG tablet Take 325 mg by mouth once as needed for headache.    Yes [provider]  aspirin EC 81 MG tablet Take 81 mg by mouth every  morning.   Yes [provider]  atorvastatin (LIPITOR) 20 MG tablet Take 1 tablet (20 mg total) by mouth daily. 06/22/18  Yes Dettinger, [name], MD  cilostazol (PLETAL) 100 MG tablet Take 1 tablet (100 mg total) by mouth 2 (two) times daily. 06/22/18  Yes Dettinger, [name], MD  donepezil (ARICEPT) 10 MG tablet Take 10 mg by mouth at bedtime.    Yes [provider]  doxazosin (CARDURA) 8 MG tablet Take 1 tablet (8 mg total) by mouth at bedtime. 06/22/18  Yes Dettinger, [name], MD  Multiple Vitamin (MULTIVITAMIN WITH MINERALS) TABS tablet Take 1 tablet by mouth daily.   Yes [provider]  nitroGLYCERIN (NITROSTAT) 0.4 MG SL tablet DISSOLVE 1 TABLET UNDER TONGUE EVERY 5 MIN AS NEEDED FOR CHEST PAIN, MAX 3 DOSES IN 15 MIN Patient taking differently: Place 0.4 mg under the tongue every 5 (five) minutes as needed for chest pain.  01/01/19  Yes Adrian Prows, MD  meclizine (ANTIVERT) 25 MG tablet Take 25 mg by mouth 3 (three) times daily as needed for dizziness.    [provider]    Family History Family History  Problem Relation Age of Onset  . Cancer Sister     Social History Social History   Tobacco Use  . Smoking status: Former Smoker    Packs/day: 0.12    Years: 10.00    Pack years: 1.20    Types: Cigarettes    Quit date: 09/04/1982    Years since quitting: 36.6  . Smokeless tobacco: Never Used  Substance Use Topics  . Alcohol use: No  . Drug use: No     Allergies   Patient has no known allergies.   Review of Systems Review of Systems  Constitutional: Negative for fever.  Respiratory: Negative for cough and shortness of breath.   Cardiovascular: Positive for chest pain.  Gastrointestinal: Negative for abdominal pain, nausea and vomiting.  Genitourinary: Negative for dysuria and hematuria.  Neurological: Negative for headaches.  All other systems reviewed and are negative.    Physical Exam Updated Vital Signs BP 120/71   Pulse 60    Temp 98.9 F (37.2 C) (Oral)   Resp 16   Ht 5\' 9"  (1.753 m)   Wt 77.1 kg   SpO2 98%   BMI 25.10 kg/m   Physical Exam Vitals signs and nursing note reviewed.  Constitutional:      Appearance: Normal appearance. He is well-developed.  HENT:     Head: Normocephalic and atraumatic.  Eyes:     General: Lids are normal.     Conjunctiva/sclera: Conjunctivae normal.     Pupils: Pupils are equal, round, and reactive to light.  Neck:     Musculoskeletal: Full passive range of motion without pain.  Cardiovascular:     Rate and Rhythm: Normal rate and regular rhythm.     Pulses: Normal pulses.     Heart sounds: Normal heart sounds. No murmur. No friction rub.  No gallop.   Pulmonary:     Effort: Pulmonary effort is normal.     Breath sounds: Normal breath sounds.     Comments: Lungs clear to auscultation bilaterally.  Symmetric chest rise.  No wheezing, rales, rhonchi. Abdominal:     Palpations: Abdomen is soft. Abdomen is not rigid.     Tenderness: There is no abdominal tenderness. There is no guarding.  Musculoskeletal: Normal range of motion.     Comments: Bilateral lower extremities are symmetric in appearance without any overlying warmth, erythema, edema.  Skin:    General: Skin is warm and dry.     Capillary Refill: Capillary refill takes less than 2 seconds.  Neurological:     Mental Status: He is alert and oriented to person, place, and time.  Psychiatric:        Speech: Speech normal.      ED Treatments / Results  Labs (all labs ordered are listed, but only abnormal results are displayed) Labs Reviewed  BASIC METABOLIC PANEL - Abnormal; Notable for the following components:      Result Value   Glucose, Bld 112 (*)    Calcium 8.7 (*)    All other components within normal limits  CBC - Abnormal; Notable for the following components:   RBC 3.97 (*)    Hemoglobin 11.8 (*)    MCHC 29.8 (*)    Platelets 604 (*)    All other components within normal limits  TROPONIN  I (HIGH SENSITIVITY)  TROPONIN I (HIGH SENSITIVITY)    EKG EKG Interpretation  Date/Time:  Tuesday April 27 2019 18:43:41 EST Ventricular Rate:  60 PR Interval:  146 QRS Duration: 115 QT Interval:  442 QTC Calculation: 442 R Axis:   -28 Text Interpretation: Sinus rhythm Atrial premature complex Nonspecific intraventricular conduction delay Low voltage, extremity leads No significant change was found Confirmed by Ezequiel Essex [id number]) on 04/27/2019 6:47:01 PM   Radiology Dg Chest 2 View  Result Date: 04/27/2019 CLINICAL DATA:  Chest pain EXAM: CHEST - 2 VIEW COMPARISON:  03/29/2018, 03/19/2018 FINDINGS: Hyperinflation. No focal opacity or pleural effusion. There is a stable nodularity in the right mid to lower lung. Stable cardiomediastinal silhouette with aortic atherosclerosis. No pneumothorax. IMPRESSION: No active cardiopulmonary disease. Electronically Signed   By: Donavan Foil M.D.   On: 04/27/2019 15:05    Procedures Procedures (including critical care time)  Medications Ordered in ED Medications - No data to display   Initial Impression / Assessment and Plan / ED Course  I have reviewed the triage vital signs and the nursing notes.  Pertinent labs & imaging results that were available during my care of the patient were reviewed by me and considered in my medical decision making (see chart for details).     HEAR Score: 46  85 year old male who presents for evaluation of episode of chest pain that began at 10 AM this morning.  He reports a sharp pain to the left side of his chest.  It was nonradiating.  States that it only lasted a few seconds before improved.  Currently not having any chest pain.  Wife wanted him to get checked out.  Initially arrival, he is afebrile, nontoxic-appearing.  Vital signs are stable.  Low suspicion for ACS etiology but given age, risk factors, will evaluate.  History/physical exam not concerning for dissection, PE.  Plan to check labs,  EKG, chest x-ray.  CBC shows no leukocytosis.  Hemoglobin is 11.8.  BMP shows normal BUN and  creatinine.  Initial troponin is negative.  Chest x-ray negative for any acute infectious etiology.  Delta troponin is negative.  This time, given 2 - delta troponins as well as atypical story, I feel that this is not related to ACS etiology.  He is not having any pain except a brief few minutes of pain that he had at 10 AM.  I instructed patient to follow-up with his cardiologist. At this time, patient exhibits no emergent life-threatening condition that require further evaluation in ED or admission. Patient had ample opportunity for questions and discussion. All patient's questions were answered with full understanding. Strict return precautions discussed. Patient expresses understanding and agreement to plan.   Portions of this note were generated with [employer]. Dictation errors may occur despite best attempts at proofreading.   Final Clinical Impressions(s) / ED Diagnoses   Final diagnoses:  Atypical chest pain    ED Discharge Orders    None       Desma Mcgregor 04/27/19 [place], MD 04/28/19 [phone]

## 2019-04-27 NOTE — Discharge Instructions (Signed)
As we discussed, your work-up today was reassuring.  Please follow-up with your cardiologist.  Return to the Emergency Department immediately if you experiencing worsening chest pain, difficulty breathing, nausea/vomiting, get very sweaty, headache or any other worsening or concerning symptoms.

## 2019-05-14 ENCOUNTER — Ambulatory Visit: Payer: Medicare HMO | Admitting: Cardiology

## 2019-05-14 ENCOUNTER — Ambulatory Visit (INDEPENDENT_AMBULATORY_CARE_PROVIDER_SITE_OTHER): Payer: Medicare HMO

## 2019-05-14 ENCOUNTER — Other Ambulatory Visit (HOSPITAL_COMMUNITY)
Admission: RE | Admit: 2019-05-14 | Discharge: 2019-05-14 | Disposition: A | Discharge status: Home / Self Care | Payer: Medicare HMO | Source: Ambulatory Visit | Attending: Cardiology | Admitting: Cardiology

## 2019-05-14 ENCOUNTER — Encounter: Payer: Self-pay | Admitting: Cardiology

## 2019-05-14 ENCOUNTER — Other Ambulatory Visit: Payer: Self-pay

## 2019-05-14 VITALS — BP 116/69 | HR 66 | Temp 97.9°F | Resp 14 | Ht 69.0 in | Wt 174.5 lb

## 2019-05-14 DIAGNOSIS — Z01812 Encounter for preprocedural laboratory examination: Secondary | ICD-10-CM | POA: Diagnosis not present

## 2019-05-14 DIAGNOSIS — E7800 Pure hypercholesterolemia, unspecified: Secondary | ICD-10-CM

## 2019-05-14 DIAGNOSIS — R0602 Shortness of breath: Secondary | ICD-10-CM

## 2019-05-14 DIAGNOSIS — I10 Essential (primary) hypertension: Secondary | ICD-10-CM | POA: Diagnosis not present

## 2019-05-14 DIAGNOSIS — I25118 Atherosclerotic heart disease of native coronary artery with other forms of angina pectoris: Secondary | ICD-10-CM

## 2019-05-14 DIAGNOSIS — Z20828 Contact with and (suspected) exposure to other viral communicable diseases: Secondary | ICD-10-CM | POA: Insufficient documentation

## 2019-05-14 MED ORDER — METOPROLOL SUCCINATE ER 25 MG PO TB24: 25.0000 mg | ORAL_TABLET | Freq: Every day | ORAL | 2 refills | Status: DC

## 2019-05-14 MED ORDER — ISOSORBIDE MONONITRATE ER 30 MG PO TB24: 30.0000 mg | ORAL_TABLET | Freq: Every day | ORAL | 2 refills | Status: DC

## 2019-05-14 NOTE — Progress Notes (Signed)
Primary Physician/Referring:  Dettinger, [name], MD  Patient ID: Joseph Byrd, male    DOB: [birth year], 85 y.o.   MRN: [id number]  Chief Complaint  Patient presents with  . Chest Pain  . Fatigue  . Transitions Of [facility] FU    HPI:    HPI: Joseph Byrd  is a 85 y.o. African-American male with coronary artery disease and angioplasty to his mid LAD in 2012 and to distal dominant circumflex coronary artery in 2015, hypertension, hyperlipidemia, symptoms suggestive mild PAD and claudication.  Patient presented to the emergency room on 04/27/2019 with chest tightness, high sensitive troponins negative troponin follow-up.  He is accompanied by his wife, states that for the past few weeks he has started to notice chest tightness similar to his angina previously even with minimal activities around the house.  He has healed symptomatic relief.  No associated symptoms of dyspnea, dizziness or syncope.   Past Medical History:  Diagnosis Date  . Arthritis    "knees, wrists, knuckles" (04/26/2014)  . CAD (coronary artery disease)   . Headache    "had a couple/wk til ~ 2 wks ago" (04/26/2014)  . Hyperlipidemia   . Hypertension   . LBBB (left bundle branch block) 07/06/2018  . PAD (peripheral artery disease) ([place])   . Type II diabetes mellitus ([place])    Past Surgical History:  Procedure Laterality Date  . BACK SURGERY    . CATARACT EXTRACTION W/ INTRAOCULAR LENS  IMPLANT, BILATERAL Bilateral ~ 2014  . CORONARY ANGIOPLASTY WITH STENT PLACEMENT  08/2010; 04/26/2014   "1, LAD; 1"  . LEFT HEART CATHETERIZATION WITH CORONARY ANGIOGRAM N/A 04/26/2014   Procedure: LEFT HEART CATHETERIZATION WITH CORONARY ANGIOGRAM;  Surgeon: Laverda Page, MD;  Location: [facility] CATH LAB;  Service: Cardiovascular;  Laterality: N/A;  . LUMBAR LAMINECTOMY/DECOMPRESSION MICRODISCECTOMY  01/2010   Social History   Socioeconomic History  . Marital status: Married    Spouse name: Clarise Cruz  . Number  of children: 6  . Years of education: 3  . Highest education level: 3rd grade  Occupational History  . Occupation: retired  Tobacco Use  . Smoking status: Former Smoker    Packs/day: 0.12    Years: 10.00    Pack years: 1.20    Types: Cigarettes    Quit date: 09/04/1982    Years since quitting: 36.7  . Smokeless tobacco: Never Used  Substance and Sexual Activity  . Alcohol use: No  . Drug use: No  . Sexual activity: Yes  Other Topics Concern  . Not on file  Social History Narrative  . Not on file   Social Determinants of Health   Financial Resource Strain:   . Difficulty of Paying Living Expenses: Not on file  Food Insecurity:   . Worried About Charity fundraiser in the Last Year: Not on file  . Ran Out of Food in the Last Year: Not on file  Transportation Needs:   . Lack of Transportation (Medical): Not on file  . Lack of Transportation (Non-Medical): Not on file  Physical Activity:   . Days of Exercise per Week: Not on file  . Minutes of Exercise per Session: Not on file  Stress:   . Feeling of Stress : Not on file  Social Connections:   . Frequency of Communication with Friends and Family: Not on file  . Frequency of Social Gatherings with Friends and Family: Not on file  . Attends Religious Services: Not on  file  . Active Member of Clubs or Organizations: Not on file  . Attends Archivist Meetings: Not on file  . Marital Status: Not on file  Intimate Partner Violence:   . Fear of Current or Ex-Partner: Not on file  . Emotionally Abused: Not on file  . Physically Abused: Not on file  . Sexually Abused: Not on file   ROS  Review of Systems  Constitution: Negative for chills, decreased appetite, malaise/fatigue and weight gain.  Eyes: Positive for visual halos (chronic).  Cardiovascular: Positive for chest pain and claudication (mild and stable, left leg worse). Negative for dyspnea on exertion, leg swelling and syncope.  Endocrine: Negative for cold  intolerance.  Hematologic/Lymphatic: Does not bruise/bleed easily.  Musculoskeletal: Negative for joint swelling.  Gastrointestinal: Negative for abdominal pain, anorexia, change in bowel habit, hematochezia and melena.  Neurological: Negative for headaches and light-headedness.  Psychiatric/Behavioral: Negative for depression and substance abuse.  All other systems reviewed and are negative.  Objective  Blood pressure 116/69, pulse 66, temperature 97.9 F (36.6 C), resp. rate 14, height 5\' 9"  (1.753 m), weight 174 lb 8 oz (79.2 kg), SpO2 97 %. Body mass index is 25.77 kg/m.   Vitals with BMI 05/14/2019 04/27/2019 04/27/2019  Height 5\' 9"  - -  Weight 174 lbs 8 oz - -  BMI Q000111Q - -  Systolic 99991111 [id number]  Diastolic 69 71 85  Pulse 66 52 51    Physical Exam  Constitutional: He appears well-developed and well-nourished.  HENT:  Head: Normocephalic.  Cardiovascular: S1 normal and S2 normal. Exam reveals no gallop.  No murmur heard. Pulses:      Carotid pulses are 2+ on the right side and 2+ on the left side.      Femoral pulses are 2+ on the right side and 2+ on the left side.      Popliteal pulses are 0 on the right side and 1+ on the left side.       Dorsalis pedis pulses are 0 on the right side and 0 on the left side.       Posterior tibial pulses are 0 on the right side and 0 on the left side.  No edema.   Pulmonary/Chest: Effort normal and breath sounds normal. He has no wheezes. He has no rales.  Musculoskeletal:     Cervical back: Neck supple.   Radiology: No results found.  Laboratory examination:   CMP Latest Ref Rng & Units 04/27/2019 10/05/2018 06/22/2018  Glucose 70 - 99 mg/dL 112(H) 109(H) 101(H)  BUN 8 - 23 mg/dL 20 23 20   Creatinine 0.61 - 1.24 mg/dL 1.10 1.26(H) 1.15  Sodium 135 - 145 mmol/L 136 142 143  Potassium 3.5 - 5.1 mmol/L 3.8 3.4(L) 4.7  Chloride 98 - 111 mmol/L 102 108 106  CO2 22 - 32 mmol/L 28 26 23   Calcium 8.9 - 10.3 mg/dL 8.7(L) 8.7(L) 9.4    Total Protein 6.5 - 8.1 g/dL - - -  Total Bilirubin 0.3 - 1.2 mg/dL - - -  Alkaline Phos 38 - 126 U/L - - -  AST 15 - 41 U/L - - -  ALT 0 - 44 U/L - - -   CBC Latest Ref Rng & Units 04/27/2019 10/05/2018 03/29/2018  WBC 4.0 - 10.5 K/uL 5.8 5.2 5.2  Hemoglobin 13.0 - 17.0 g/dL 11.8(L) 11.1(L) 12.3(L)  Hematocrit 39.0 - 52.0 % 39.6 35.7(L) 39.4  Platelets 150 - 400 K/uL 604(H) 568(H) 567(H)  Lipid Panel     Component Value Date/Time   CHOL 124 05/14/2019 1433   CHOL 130 09/03/2012 1629   TRIG 41 05/14/2019 1433   TRIG 60 08/27/2013 1056   TRIG 66 09/03/2012 1629   HDL 64 05/14/2019 1433   HDL 47 08/27/2013 1056   HDL 46 09/03/2012 1629   CHOLHDL 2.6 03/20/2018 0516   VLDL 6 03/20/2018 0516   LDLCALC 50 05/14/2019 1433   LDLCALC 67 08/27/2013 1056   LDLCALC 71 09/03/2012 1629   HEMOGLOBIN A1C Lab Results  Component Value Date   HGBA1C 5.0 06/22/2018   MPG 82.45 03/19/2018   TSH Recent Labs    05/14/19 1433  TSH 1.530   Medications   Current Outpatient Medications  Medication Instructions  . amLODIPine-Valsartan-HCTZ 10-160-25 MG TABS 1 tablet, Oral, Daily  . aspirin EC 81 mg, Oral,  Every morning - 10a  . atorvastatin (LIPITOR) 20 mg, Oral, Daily  . cilostazol (PLETAL) 100 mg, Oral, 2 times daily  . donepezil (ARICEPT) 10 mg, Oral, Daily at bedtime  . doxazosin (CARDURA) 8 mg, Oral, Daily at bedtime  . isosorbide mononitrate (IMDUR) 30 mg, Oral, Daily  . metoprolol succinate (TOPROL-XL) 25 mg, Oral, Daily, Take with or immediately following a meal.  . Multiple Vitamin (MULTIVITAMIN WITH MINERALS) TABS tablet 1 tablet, Oral, Daily  . nitroGLYCERIN (NITROSTAT) 0.4 MG SL tablet DISSOLVE 1 TABLET UNDER TONGUE EVERY 5 MIN AS NEEDED FOR CHEST PAIN, MAX 3 DOSES IN 15 MIN   Cardiac Studies:   Cardiac studies: LE arterial duplex 02/10/12:  Bilat ABI 0.92, improved from 0.6 Rt, 0.7 Lt on 02/25/11. Bilat dist SFA stenosis.  Left Heart Catheterization 04/26/2014:  Distal  circumflex (dominant) 3.5x68mm Promus stent. Mid LAD stent placed on 08/21/10 (IVUS guided 2.25x22 Resolute DES) widely patent. Mild lumiinal irregularities in other vessels.   Hospital Lexiscan Myoview stress test 03/20/2018:  There was no ST segment deviation noted during stress.   Findings consistent with prior inferior/basal inferoseptal/inferoapical myocardial infarction. There is no current ischemia. This is a low to intermediate risk study based on prior scar and mildly decreased LVEF, there is no myocardium currently at jeopardy. The left ventricular ejection fraction is mildly decreased (48%).   Assessment     ICD-10-CM   1. Coronary artery disease of native artery of native heart with stable angina pectoris (HCC)  I25.118 PCV ECHOCARDIOGRAM COMPLETE    isosorbide mononitrate (IMDUR) 30 MG 24 hr tablet    metoprolol succinate (TOPROL-XL) 25 MG 24 hr tablet  2. Shortness of breath  R06.02   3. Essential hypertension  I10 EKG 12-Lead    TSH  4. Hypercholesteremia  E78.00 Lipid Panel With LDL/HDL Ratio    EKG 05/14/2019: Normal sinus rhythm at rate of 72 bpm, left atrial enlargement, normal axis.  Incomplete right bundle branch block.  Baseline artifact.  Nonspecific T abnormality.  No significant change from 01/06/2019.   Recommendations:   Kermitt Marcello  is a 85 y.o. African-American male with coronary artery disease and angioplasty to his mid LAD in 2012 and to distal dominant circumflex coronary artery in 2015, hypertension, hyperlipidemia, symptoms suggestive mild PAD and claudication.  Patient presented to the emergency room on 04/27/2019 with chest tightness, high sensitive troponins negative troponin follow-up.  Symptoms are suggestive of progressive angina pectoris, unresectable polyp catheterization.  I have added Imdur 30 mg daily and metoprolol succinate 25 mg daily for now.  I reviewed his labs, obtain lipids which are under excellent control  and TSH is normal as  well. Schedule for cardiac catheterization, and possible angioplasty. We discussed regarding risks, benefits, alternatives to this including stress testing, CTA and continued medical therapy. Patient wants to proceed. Understands <1-2% risk of death, stroke, MI, urgent CABG, bleeding, infection, renal failure but not limited to these.   He is presently on dual antiplatelet therapy with cilostazol and also aspirin and he has not had any bleeding diathesis on the same and symptoms of claudication are very mild.  He has chronic mild anemia which is remained stable, also has thrombocytosis.  Hence would recommend continuing Pletal and aspirin for now until coronary angiogram performed and evaluated.   Adrian Prows, MD, [facility] 05/15/2019, 9:19 AM [facility]. [place] Pager: [phone] Office: [phone] If no answer Cell [phone]

## 2019-05-14 NOTE — H&P (View-Only) (Signed)
Primary Physician/Referring:  Dettinger, [name], MD  Patient ID: Joseph Byrd, male    DOB: 1934/02/10, 85 y.o.   MRN: [id number]  Chief Complaint  Patient presents with  . Chest Pain  . Fatigue  . Transitions Of [facility] FU    HPI:    HPI: Joseph Byrd  is a 85 y.o. African-American male with coronary artery disease and angioplasty to his mid LAD in 2012 and to distal dominant circumflex coronary artery in 2015, hypertension, hyperlipidemia, symptoms suggestive mild PAD and claudication.  Patient presented to the emergency room on 04/27/2019 with chest tightness, high sensitive troponins negative troponin follow-up.  He is accompanied by his wife, states that for the past few weeks he has started to notice chest tightness similar to his angina previously even with minimal activities around the house.  He has healed symptomatic relief.  No associated symptoms of dyspnea, dizziness or syncope.   Past Medical History:  Diagnosis Date  . Arthritis    "knees, wrists, knuckles" (04/26/2014)  . CAD (coronary artery disease)   . Headache    "had a couple/wk til ~ 2 wks ago" (04/26/2014)  . Hyperlipidemia   . Hypertension   . LBBB (left bundle branch block) 07/06/2018  . PAD (peripheral artery disease) ([place])   . Type II diabetes mellitus ([place])    Past Surgical History:  Procedure Laterality Date  . BACK SURGERY    . CATARACT EXTRACTION W/ INTRAOCULAR LENS  IMPLANT, BILATERAL Bilateral ~ 2014  . CORONARY ANGIOPLASTY WITH STENT PLACEMENT  08/2010; 04/26/2014   "1, LAD; 1"  . LEFT HEART CATHETERIZATION WITH CORONARY ANGIOGRAM N/A 04/26/2014   Procedure: LEFT HEART CATHETERIZATION WITH CORONARY ANGIOGRAM;  Surgeon: Laverda Page, MD;  Location: [facility] CATH LAB;  Service: Cardiovascular;  Laterality: N/A;  . LUMBAR LAMINECTOMY/DECOMPRESSION MICRODISCECTOMY  01/2010   Social History   Socioeconomic History  . Marital status: Married    Spouse name: Clarise Cruz  . Number  of children: 6  . Years of education: 3  . Highest education level: 3rd grade  Occupational History  . Occupation: retired  Tobacco Use  . Smoking status: Former Smoker    Packs/day: 0.12    Years: 10.00    Pack years: 1.20    Types: Cigarettes    Quit date: 09/04/1982    Years since quitting: 36.7  . Smokeless tobacco: Never Used  Substance and Sexual Activity  . Alcohol use: No  . Drug use: No  . Sexual activity: Yes  Other Topics Concern  . Not on file  Social History Narrative  . Not on file   Social Determinants of Health   Financial Resource Strain:   . Difficulty of Paying Living Expenses: Not on file  Food Insecurity:   . Worried About Charity fundraiser in the Last Year: Not on file  . Ran Out of Food in the Last Year: Not on file  Transportation Needs:   . Lack of Transportation (Medical): Not on file  . Lack of Transportation (Non-Medical): Not on file  Physical Activity:   . Days of Exercise per Week: Not on file  . Minutes of Exercise per Session: Not on file  Stress:   . Feeling of Stress : Not on file  Social Connections:   . Frequency of Communication with Friends and Family: Not on file  . Frequency of Social Gatherings with Friends and Family: Not on file  . Attends Religious Services: Not on  file  . Active Member of Clubs or Organizations: Not on file  . Attends Archivist Meetings: Not on file  . Marital Status: Not on file  Intimate Partner Violence:   . Fear of Current or Ex-Partner: Not on file  . Emotionally Abused: Not on file  . Physically Abused: Not on file  . Sexually Abused: Not on file   ROS  Review of Systems  Constitution: Negative for chills, decreased appetite, malaise/fatigue and weight gain.  Eyes: Positive for visual halos (chronic).  Cardiovascular: Positive for chest pain and claudication (mild and stable, left leg worse). Negative for dyspnea on exertion, leg swelling and syncope.  Endocrine: Negative for cold  intolerance.  Hematologic/Lymphatic: Does not bruise/bleed easily.  Musculoskeletal: Negative for joint swelling.  Gastrointestinal: Negative for abdominal pain, anorexia, change in bowel habit, hematochezia and melena.  Neurological: Negative for headaches and light-headedness.  Psychiatric/Behavioral: Negative for depression and substance abuse.  All other systems reviewed and are negative.  Objective  Blood pressure 116/69, pulse 66, temperature 97.9 F (36.6 C), resp. rate 14, height 5\' 9"  (1.753 m), weight 174 lb 8 oz (79.2 kg), SpO2 97 %. Body mass index is 25.77 kg/m.   Vitals with BMI 05/14/2019 04/27/2019 04/27/2019  Height 5\' 9"  - -  Weight 174 lbs 8 oz - -  BMI Q000111Q - -  Systolic 99991111 0000000 [id number]  Diastolic 69 71 85  Pulse 66 52 51    Physical Exam  Constitutional: He appears well-developed and well-nourished.  HENT:  Head: Normocephalic.  Cardiovascular: S1 normal and S2 normal. Exam reveals no gallop.  No murmur heard. Pulses:      Carotid pulses are 2+ on the right side and 2+ on the left side.      Femoral pulses are 2+ on the right side and 2+ on the left side.      Popliteal pulses are 0 on the right side and 1+ on the left side.       Dorsalis pedis pulses are 0 on the right side and 0 on the left side.       Posterior tibial pulses are 0 on the right side and 0 on the left side.  No edema.   Pulmonary/Chest: Effort normal and breath sounds normal. He has no wheezes. He has no rales.  Musculoskeletal:     Cervical back: Neck supple.   Radiology: No results found.  Laboratory examination:   CMP Latest Ref Rng & Units 04/27/2019 10/05/2018 06/22/2018  Glucose 70 - 99 mg/dL 112(H) 109(H) 101(H)  BUN 8 - 23 mg/dL 20 23 20   Creatinine 0.61 - 1.24 mg/dL 1.10 1.26(H) 1.15  Sodium 135 - 145 mmol/L 136 142 143  Potassium 3.5 - 5.1 mmol/L 3.8 3.4(L) 4.7  Chloride 98 - 111 mmol/L 102 108 106  CO2 22 - 32 mmol/L 28 26 23   Calcium 8.9 - 10.3 mg/dL 8.7(L) 8.7(L) 9.4    Total Protein 6.5 - 8.1 g/dL - - -  Total Bilirubin 0.3 - 1.2 mg/dL - - -  Alkaline Phos 38 - 126 U/L - - -  AST 15 - 41 U/L - - -  ALT 0 - 44 U/L - - -   CBC Latest Ref Rng & Units 04/27/2019 10/05/2018 03/29/2018  WBC 4.0 - 10.5 K/uL 5.8 5.2 5.2  Hemoglobin 13.0 - 17.0 g/dL 11.8(L) 11.1(L) 12.3(L)  Hematocrit 39.0 - 52.0 % 39.6 35.7(L) 39.4  Platelets 150 - 400 K/uL 604(H) 568(H) 567(H)  Lipid Panel     Component Value Date/Time   CHOL 124 05/14/2019 1433   CHOL 130 09/03/2012 1629   TRIG 41 05/14/2019 1433   TRIG 60 08/27/2013 1056   TRIG 66 09/03/2012 1629   HDL 64 05/14/2019 1433   HDL 47 08/27/2013 1056   HDL 46 09/03/2012 1629   CHOLHDL 2.6 03/20/2018 0516   VLDL 6 03/20/2018 0516   LDLCALC 50 05/14/2019 1433   LDLCALC 67 08/27/2013 1056   LDLCALC 71 09/03/2012 1629   HEMOGLOBIN A1C Lab Results  Component Value Date   HGBA1C 5.0 06/22/2018   MPG 82.45 03/19/2018   TSH Recent Labs    05/14/19 1433  TSH 1.530   Medications   Current Outpatient Medications  Medication Instructions  . amLODIPine-Valsartan-HCTZ 10-160-25 MG TABS 1 tablet, Oral, Daily  . aspirin EC 81 mg, Oral,  Every morning - 10a  . atorvastatin (LIPITOR) 20 mg, Oral, Daily  . cilostazol (PLETAL) 100 mg, Oral, 2 times daily  . donepezil (ARICEPT) 10 mg, Oral, Daily at bedtime  . doxazosin (CARDURA) 8 mg, Oral, Daily at bedtime  . isosorbide mononitrate (IMDUR) 30 mg, Oral, Daily  . metoprolol succinate (TOPROL-XL) 25 mg, Oral, Daily, Take with or immediately following a meal.  . Multiple Vitamin (MULTIVITAMIN WITH MINERALS) TABS tablet 1 tablet, Oral, Daily  . nitroGLYCERIN (NITROSTAT) 0.4 MG SL tablet DISSOLVE 1 TABLET UNDER TONGUE EVERY 5 MIN AS NEEDED FOR CHEST PAIN, MAX 3 DOSES IN 15 MIN   Cardiac Studies:   Cardiac studies: LE arterial duplex 02/10/12:  Bilat ABI 0.92, improved from 0.6 Rt, 0.7 Lt on 02/25/11. Bilat dist SFA stenosis.  Left Heart Catheterization 04/26/2014:  Distal  circumflex (dominant) 3.5x66mm Promus stent. Mid LAD stent placed on 08/21/10 (IVUS guided 2.25x22 Resolute DES) widely patent. Mild lumiinal irregularities in other vessels.   Hospital Lexiscan Myoview stress test 03/20/2018:  There was no ST segment deviation noted during stress.   Findings consistent with prior inferior/basal inferoseptal/inferoapical myocardial infarction. There is no current ischemia. This is a low to intermediate risk study based on prior scar and mildly decreased LVEF, there is no myocardium currently at jeopardy. The left ventricular ejection fraction is mildly decreased (48%).   Assessment     ICD-10-CM   1. Coronary artery disease of native artery of native heart with stable angina pectoris (HCC)  I25.118 PCV ECHOCARDIOGRAM COMPLETE    isosorbide mononitrate (IMDUR) 30 MG 24 hr tablet    metoprolol succinate (TOPROL-XL) 25 MG 24 hr tablet  2. Shortness of breath  R06.02   3. Essential hypertension  I10 EKG 12-Lead    TSH  4. Hypercholesteremia  E78.00 Lipid Panel With LDL/HDL Ratio    EKG 05/14/2019: Normal sinus rhythm at rate of 72 bpm, left atrial enlargement, normal axis.  Incomplete right bundle branch block.  Baseline artifact.  Nonspecific T abnormality.  No significant change from 01/06/2019.   Recommendations:   Joseph Byrd  is a 85 y.o. African-American male with coronary artery disease and angioplasty to his mid LAD in 2012 and to distal dominant circumflex coronary artery in 2015, hypertension, hyperlipidemia, symptoms suggestive mild PAD and claudication.  Patient presented to the emergency room on 04/27/2019 with chest tightness, high sensitive troponins negative troponin follow-up.  Symptoms are suggestive of progressive angina pectoris, unresectable polyp catheterization.  I have added Imdur 30 mg daily and metoprolol succinate 25 mg daily for now.  I reviewed his labs, obtain lipids which are under excellent control  and TSH is normal as  well. Schedule for cardiac catheterization, and possible angioplasty. We discussed regarding risks, benefits, alternatives to this including stress testing, CTA and continued medical therapy. Patient wants to proceed. Understands <1-2% risk of death, stroke, MI, urgent CABG, bleeding, infection, renal failure but not limited to these.   He is presently on dual antiplatelet therapy with cilostazol and also aspirin and he has not had any bleeding diathesis on the same and symptoms of claudication are very mild.  He has chronic mild anemia which is remained stable, also has thrombocytosis.  Hence would recommend continuing Pletal and aspirin for now until coronary angiogram performed and evaluated.   Adrian Prows, MD, [facility] 05/15/2019, 9:19 AM [facility]. [place] Pager: [phone] Office: [phone] If no answer Cell [phone]

## 2019-05-15 LAB — LIPID PANEL WITH LDL/HDL RATIO
Cholesterol, Total: 124 mg/dL (ref 100–199)
HDL: 64 mg/dL (ref >39)
LDL Chol Calc (NIH): 50 mg/dL (ref 0–99)
LDL/HDL Ratio: 0.8 ratio (ref 0.0–3.6)
Triglycerides: 41 mg/dL (ref 0–149)
VLDL Cholesterol Cal: 10 mg/dL (ref 5–40)

## 2019-05-15 LAB — NOVEL CORONAVIRUS, NAA (HOSP ORDER, SEND-OUT TO REF LAB; TAT 18-24 HRS): SARS-CoV-2, NAA: NOT DETECTED

## 2019-05-15 LAB — TSH: TSH: 1.53 u[IU]/mL (ref 0.450–4.500)

## 2019-05-18 ENCOUNTER — Inpatient Hospital Stay (HOSPITAL_COMMUNITY): Payer: Medicare HMO

## 2019-05-18 ENCOUNTER — Encounter (HOSPITAL_COMMUNITY)
Admission: AD | Disposition: A | Discharge status: Home / Self Care | Payer: Self-pay | Source: Home / Self Care | Attending: Thoracic Surgery (Cardiothoracic Vascular Surgery)

## 2019-05-18 ENCOUNTER — Other Ambulatory Visit: Payer: Self-pay

## 2019-05-18 ENCOUNTER — Encounter (HOSPITAL_COMMUNITY): Payer: Self-pay | Admitting: Cardiology

## 2019-05-18 ENCOUNTER — Inpatient Hospital Stay (HOSPITAL_COMMUNITY)
Admission: AD | Admit: 2019-05-18 | Discharge: 2019-05-26 | DRG: 234 | Disposition: A | Discharge status: Home / Self Care | Payer: Medicare HMO | Attending: Thoracic Surgery (Cardiothoracic Vascular Surgery) | Admitting: Thoracic Surgery (Cardiothoracic Vascular Surgery)

## 2019-05-18 DIAGNOSIS — E1159 Type 2 diabetes mellitus with other circulatory complications: Secondary | ICD-10-CM | POA: Diagnosis not present

## 2019-05-18 DIAGNOSIS — H5319 Other subjective visual disturbances: Secondary | ICD-10-CM | POA: Diagnosis present

## 2019-05-18 DIAGNOSIS — D649 Anemia, unspecified: Secondary | ICD-10-CM | POA: Diagnosis present

## 2019-05-18 DIAGNOSIS — M1990 Unspecified osteoarthritis, unspecified site: Secondary | ICD-10-CM | POA: Diagnosis present

## 2019-05-18 DIAGNOSIS — I2584 Coronary atherosclerosis due to calcified coronary lesion: Secondary | ICD-10-CM | POA: Diagnosis present

## 2019-05-18 DIAGNOSIS — I712 Thoracic aortic aneurysm, without rupture: Secondary | ICD-10-CM | POA: Diagnosis not present

## 2019-05-18 DIAGNOSIS — I081 Rheumatic disorders of both mitral and tricuspid valves: Secondary | ICD-10-CM | POA: Diagnosis not present

## 2019-05-18 DIAGNOSIS — I080 Rheumatic disorders of both mitral and aortic valves: Secondary | ICD-10-CM | POA: Diagnosis present

## 2019-05-18 DIAGNOSIS — E1151 Type 2 diabetes mellitus with diabetic peripheral angiopathy without gangrene: Secondary | ICD-10-CM | POA: Diagnosis not present

## 2019-05-18 DIAGNOSIS — Z951 Presence of aortocoronary bypass graft: Secondary | ICD-10-CM

## 2019-05-18 DIAGNOSIS — Z955 Presence of coronary angioplasty implant and graft: Secondary | ICD-10-CM

## 2019-05-18 DIAGNOSIS — Z7902 Long term (current) use of antithrombotics/antiplatelets: Secondary | ICD-10-CM

## 2019-05-18 DIAGNOSIS — E785 Hyperlipidemia, unspecified: Secondary | ICD-10-CM | POA: Diagnosis present

## 2019-05-18 DIAGNOSIS — Z9842 Cataract extraction status, left eye: Secondary | ICD-10-CM

## 2019-05-18 DIAGNOSIS — I209 Angina pectoris, unspecified: Secondary | ICD-10-CM

## 2019-05-18 DIAGNOSIS — I25119 Atherosclerotic heart disease of native coronary artery with unspecified angina pectoris: Secondary | ICD-10-CM | POA: Diagnosis not present

## 2019-05-18 DIAGNOSIS — Z9841 Cataract extraction status, right eye: Secondary | ICD-10-CM | POA: Diagnosis not present

## 2019-05-18 DIAGNOSIS — Z781 Physical restraint status: Secondary | ICD-10-CM | POA: Diagnosis not present

## 2019-05-18 DIAGNOSIS — F05 Delirium due to known physiological condition: Secondary | ICD-10-CM | POA: Diagnosis not present

## 2019-05-18 DIAGNOSIS — E8770 Fluid overload, unspecified: Secondary | ICD-10-CM | POA: Diagnosis not present

## 2019-05-18 DIAGNOSIS — Z01810 Encounter for preprocedural cardiovascular examination: Secondary | ICD-10-CM

## 2019-05-18 DIAGNOSIS — Z7982 Long term (current) use of aspirin: Secondary | ICD-10-CM | POA: Diagnosis not present

## 2019-05-18 DIAGNOSIS — I25110 Atherosclerotic heart disease of native coronary artery with unstable angina pectoris: Secondary | ICD-10-CM | POA: Diagnosis not present

## 2019-05-18 DIAGNOSIS — J939 Pneumothorax, unspecified: Secondary | ICD-10-CM | POA: Diagnosis not present

## 2019-05-18 DIAGNOSIS — F0281 Dementia in other diseases classified elsewhere with behavioral disturbance: Secondary | ICD-10-CM | POA: Diagnosis not present

## 2019-05-18 DIAGNOSIS — R339 Retention of urine, unspecified: Secondary | ICD-10-CM | POA: Diagnosis not present

## 2019-05-18 DIAGNOSIS — D62 Acute posthemorrhagic anemia: Secondary | ICD-10-CM | POA: Diagnosis not present

## 2019-05-18 DIAGNOSIS — Z01811 Encounter for preprocedural respiratory examination: Secondary | ICD-10-CM

## 2019-05-18 DIAGNOSIS — I4519 Other right bundle-branch block: Secondary | ICD-10-CM | POA: Diagnosis present

## 2019-05-18 DIAGNOSIS — Z79899 Other long term (current) drug therapy: Secondary | ICD-10-CM

## 2019-05-18 DIAGNOSIS — G309 Alzheimer's disease, unspecified: Secondary | ICD-10-CM | POA: Diagnosis present

## 2019-05-18 DIAGNOSIS — M160 Bilateral primary osteoarthritis of hip: Secondary | ICD-10-CM | POA: Diagnosis not present

## 2019-05-18 DIAGNOSIS — J90 Pleural effusion, not elsewhere classified: Secondary | ICD-10-CM

## 2019-05-18 DIAGNOSIS — Z961 Presence of intraocular lens: Secondary | ICD-10-CM | POA: Diagnosis present

## 2019-05-18 DIAGNOSIS — D473 Essential (hemorrhagic) thrombocythemia: Secondary | ICD-10-CM | POA: Diagnosis present

## 2019-05-18 DIAGNOSIS — T199XXA Foreign body in genitourinary tract, part unspecified, initial encounter: Secondary | ICD-10-CM

## 2019-05-18 DIAGNOSIS — I2510 Atherosclerotic heart disease of native coronary artery without angina pectoris: Secondary | ICD-10-CM | POA: Diagnosis not present

## 2019-05-18 DIAGNOSIS — R69 Illness, unspecified: Secondary | ICD-10-CM | POA: Diagnosis not present

## 2019-05-18 DIAGNOSIS — I10 Essential (primary) hypertension: Secondary | ICD-10-CM | POA: Diagnosis not present

## 2019-05-18 DIAGNOSIS — J9811 Atelectasis: Secondary | ICD-10-CM | POA: Diagnosis not present

## 2019-05-18 DIAGNOSIS — Z87891 Personal history of nicotine dependence: Secondary | ICD-10-CM | POA: Diagnosis not present

## 2019-05-18 DIAGNOSIS — E1165 Type 2 diabetes mellitus with hyperglycemia: Secondary | ICD-10-CM | POA: Diagnosis not present

## 2019-05-18 HISTORY — PX: LEFT HEART CATH AND CORONARY ANGIOGRAPHY: CATH118249

## 2019-05-18 LAB — MRSA PCR SCREENING: MRSA by PCR: NEGATIVE

## 2019-05-18 LAB — URINALYSIS, ROUTINE W REFLEX MICROSCOPIC
Bilirubin Urine: NEGATIVE
Glucose, UA: NEGATIVE mg/dL
Hgb urine dipstick: NEGATIVE
Ketones, ur: 5 mg/dL — AB
Leukocytes,Ua: NEGATIVE
Nitrite: NEGATIVE
Protein, ur: NEGATIVE mg/dL
Specific Gravity, Urine: 1.02 (ref 1.005–1.030)
pH: 6 (ref 5.0–8.0)

## 2019-05-18 LAB — POCT I-STAT 7, (LYTES, BLD GAS, ICA,H+H)
Acid-Base Excess: 1 mmol/L (ref 0.0–2.0)
Bicarbonate: 26 mmol/L (ref 20.0–28.0)
Calcium, Ion: 1.22 mmol/L (ref 1.15–1.40)
HCT: 40 % (ref 39.0–52.0)
Hemoglobin: 13.6 g/dL (ref 13.0–17.0)
O2 Saturation: 96 %
Patient temperature: 98.8
Potassium: 3.9 mmol/L (ref 3.5–5.1)
Sodium: 138 mmol/L (ref 135–145)
TCO2: 27 mmol/L (ref 22–32)
pCO2 arterial: 41.8 mmHg (ref 32.0–48.0)
pH, Arterial: 7.402 (ref 7.350–7.450)
pO2, Arterial: 84 mmHg (ref 83.0–108.0)

## 2019-05-18 LAB — GLUCOSE, CAPILLARY
Glucose-Capillary: 106 mg/dL — ABNORMAL HIGH (ref 70–99)
Glucose-Capillary: 94 mg/dL (ref 70–99)

## 2019-05-18 SURGERY — LEFT HEART CATH AND CORONARY ANGIOGRAPHY
Anesthesia: LOCAL

## 2019-05-18 MED ORDER — SODIUM CHLORIDE 0.9% FLUSH: 3.0000 mL | Freq: Two times a day (BID) | INTRAVENOUS | Status: DC

## 2019-05-18 MED ORDER — DEXMEDETOMIDINE HCL IN NACL 400 MCG/100ML IV SOLN
0.1000 ug/kg/h | INTRAVENOUS | Status: AC
  Administered 2019-05-19: 11:00:00 .3 ug/kg/h via INTRAVENOUS
  Filled 2019-05-18 (×2): qty 100

## 2019-05-18 MED ORDER — HEPARIN SODIUM (PORCINE) 1000 UNIT/ML IJ SOLN
INTRAMUSCULAR | Status: DC | PRN
  Administered 2019-05-18: 5000 [IU] via INTRAVENOUS

## 2019-05-18 MED ORDER — EPINEPHRINE HCL 5 MG/250ML IV SOLN IN NS
0.0000 ug/min | INTRAVENOUS | Status: DC
  Filled 2019-05-18 (×2): qty 250

## 2019-05-18 MED ORDER — SODIUM CHLORIDE 0.9 % IV SOLN
750.0000 mg | INTRAVENOUS | Status: DC
  Filled 2019-05-18 (×2): qty 750

## 2019-05-18 MED ORDER — SODIUM CHLORIDE 0.9 % IV SOLN
INTRAVENOUS | Status: DC
  Filled 2019-05-18 (×2): qty 30

## 2019-05-18 MED ORDER — TEMAZEPAM 15 MG PO CAPS: 15.0000 mg | ORAL_CAPSULE | Freq: Once | ORAL | Status: DC | PRN

## 2019-05-18 MED ORDER — MIDAZOLAM HCL 2 MG/2ML IJ SOLN
INTRAMUSCULAR | Status: DC | PRN
  Administered 2019-05-18: 1 mg via INTRAVENOUS

## 2019-05-18 MED ORDER — HEPARIN (PORCINE) 25000 UT/250ML-% IV SOLN
1000.0000 [IU]/h | INTRAVENOUS | Status: DC
  Filled 2019-05-18: qty 250

## 2019-05-18 MED ORDER — SODIUM CHLORIDE 0.9% FLUSH: 3.0000 mL | INTRAVENOUS | Status: DC | PRN

## 2019-05-18 MED ORDER — INSULIN REGULAR(HUMAN) IN NACL 100-0.9 UT/100ML-% IV SOLN
INTRAVENOUS | Status: AC
  Administered 2019-05-19: 11:00:00 1.8 [IU]/h via INTRAVENOUS
  Filled 2019-05-18 (×2): qty 100

## 2019-05-18 MED ORDER — ASPIRIN 81 MG PO CHEW: 81.0000 mg | CHEWABLE_TABLET | ORAL | Status: DC

## 2019-05-18 MED ORDER — TRANEXAMIC ACID 1000 MG/10ML IV SOLN
1.5000 mg/kg/h | INTRAVENOUS | Status: AC
  Administered 2019-05-19: 1.5 mg/kg/h via INTRAVENOUS
  Filled 2019-05-18 (×2): qty 25

## 2019-05-18 MED ORDER — MILRINONE LACTATE IN DEXTROSE 20-5 MG/100ML-% IV SOLN
0.3000 ug/kg/min | INTRAVENOUS | Status: DC
  Filled 2019-05-18 (×2): qty 100

## 2019-05-18 MED ORDER — ONDANSETRON HCL 4 MG/2ML IJ SOLN: 4.0000 mg | Freq: Four times a day (QID) | INTRAMUSCULAR | Status: DC | PRN

## 2019-05-18 MED ORDER — HEPARIN SODIUM (PORCINE) 1000 UNIT/ML IJ SOLN
INTRAMUSCULAR | Status: AC
  Filled 2019-05-18: qty 1

## 2019-05-18 MED ORDER — FENTANYL CITRATE (PF) 100 MCG/2ML IJ SOLN
INTRAMUSCULAR | Status: AC
  Filled 2019-05-18: qty 2

## 2019-05-18 MED ORDER — HEPARIN (PORCINE) IN NACL 1000-0.9 UT/500ML-% IV SOLN
INTRAVENOUS | Status: AC
  Filled 2019-05-18: qty 1000

## 2019-05-18 MED ORDER — SODIUM CHLORIDE 0.9 % IV SOLN: 250.0000 mL | INTRAVENOUS | Status: DC | PRN

## 2019-05-18 MED ORDER — VANCOMYCIN HCL 10 G IV SOLR
1250.0000 mg | INTRAVENOUS | Status: AC
  Administered 2019-05-19: 1250 mg via INTRAVENOUS
  Filled 2019-05-18 (×2): qty 1250

## 2019-05-18 MED ORDER — ACETAMINOPHEN 325 MG PO TABS: 650.0000 mg | ORAL_TABLET | ORAL | Status: DC | PRN

## 2019-05-18 MED ORDER — HEPARIN (PORCINE) 25000 UT/250ML-% IV SOLN
1000.0000 [IU]/h | INTRAVENOUS | Status: DC
  Administered 2019-05-18: 1000 [IU]/h via INTRAVENOUS
  Filled 2019-05-18: qty 250

## 2019-05-18 MED ORDER — VERAPAMIL HCL 2.5 MG/ML IV SOLN
INTRAVENOUS | Status: DC | PRN
  Administered 2019-05-18: 5 mL via INTRA_ARTERIAL

## 2019-05-18 MED ORDER — CHLORHEXIDINE GLUCONATE 0.12 % MT SOLN
15.0000 mL | Freq: Once | OROMUCOSAL | Status: AC
  Administered 2019-05-19: 15 mL via OROMUCOSAL
  Filled 2019-05-18: qty 15

## 2019-05-18 MED ORDER — DONEPEZIL HCL 10 MG PO TABS
10.0000 mg | ORAL_TABLET | Freq: Every day | ORAL | Status: DC
  Administered 2019-05-18 – 2019-05-25 (×6): 10 mg via ORAL
  Filled 2019-05-18 (×9): qty 1

## 2019-05-18 MED ORDER — MIDAZOLAM HCL 2 MG/2ML IJ SOLN
INTRAMUSCULAR | Status: AC
  Filled 2019-05-18: qty 2

## 2019-05-18 MED ORDER — POLYETHYL GLYCOL-PROPYL GLYCOL 0.4-0.3 % OP GEL: Freq: Every day | OPHTHALMIC | Status: DC

## 2019-05-18 MED ORDER — TRANEXAMIC ACID (OHS) PUMP PRIME SOLUTION
2.0000 mg/kg | INTRAVENOUS | Status: DC
  Filled 2019-05-18 (×2): qty 1.58

## 2019-05-18 MED ORDER — ACETAMINOPHEN 325 MG PO TABS: 650.0000 mg | ORAL_TABLET | Freq: Four times a day (QID) | ORAL | Status: DC | PRN

## 2019-05-18 MED ORDER — SODIUM CHLORIDE 0.9 % IV SOLN: INTRAVENOUS | Status: DC

## 2019-05-18 MED ORDER — PHENYLEPHRINE HCL-NACL 20-0.9 MG/250ML-% IV SOLN
30.0000 ug/min | INTRAVENOUS | Status: AC
  Administered 2019-05-19: 13:00:00 20 ug/min via INTRAVENOUS
  Administered 2019-05-19: 40 ug/min via INTRAVENOUS
  Administered 2019-05-19: 13:00:00 60 ug/min via INTRAVENOUS
  Filled 2019-05-18 (×2): qty 250

## 2019-05-18 MED ORDER — ASPIRIN EC 81 MG PO TBEC: 81.0000 mg | DELAYED_RELEASE_TABLET | Freq: Every morning | ORAL | Status: DC

## 2019-05-18 MED ORDER — METOPROLOL TARTRATE 12.5 MG HALF TABLET
12.5000 mg | ORAL_TABLET | Freq: Once | ORAL | Status: AC
  Administered 2019-05-19: 12.5 mg via ORAL
  Filled 2019-05-18: qty 1

## 2019-05-18 MED ORDER — SODIUM CHLORIDE 0.9 % WEIGHT BASED INFUSION: 1.0000 mL/kg/h | INTRAVENOUS | Status: AC

## 2019-05-18 MED ORDER — NOREPINEPHRINE 4 MG/250ML-% IV SOLN
0.0000 ug/min | INTRAVENOUS | Status: DC
  Filled 2019-05-18 (×2): qty 250

## 2019-05-18 MED ORDER — LIDOCAINE HCL (PF) 1 % IJ SOLN
INTRAMUSCULAR | Status: DC | PRN
  Administered 2019-05-18: 2 mL via INTRADERMAL

## 2019-05-18 MED ORDER — VERAPAMIL HCL 2.5 MG/ML IV SOLN
INTRAVENOUS | Status: AC
  Filled 2019-05-18: qty 2

## 2019-05-18 MED ORDER — MAGNESIUM SULFATE 50 % IJ SOLN
40.0000 meq | INTRAMUSCULAR | Status: DC
  Filled 2019-05-18 (×2): qty 9.85

## 2019-05-18 MED ORDER — MANNITOL 20 % IV SOLN
Freq: Once | INTRAVENOUS | Status: DC
  Filled 2019-05-18 (×2): qty 13

## 2019-05-18 MED ORDER — IOHEXOL 350 MG/ML SOLN
INTRAVENOUS | Status: DC | PRN
  Administered 2019-05-18: 80 mL via INTRACARDIAC

## 2019-05-18 MED ORDER — METOPROLOL SUCCINATE ER 25 MG PO TB24: 25.0000 mg | ORAL_TABLET | Freq: Every day | ORAL | Status: DC

## 2019-05-18 MED ORDER — VANCOMYCIN HCL 1000 MG IV SOLR
INTRAVENOUS | Status: DC
  Filled 2019-05-18 (×2): qty 1000

## 2019-05-18 MED ORDER — CHLORHEXIDINE GLUCONATE CLOTH 2 % EX PADS
6.0000 | MEDICATED_PAD | Freq: Once | CUTANEOUS | Status: AC
  Administered 2019-05-18: 6 via TOPICAL

## 2019-05-18 MED ORDER — FENTANYL CITRATE (PF) 100 MCG/2ML IJ SOLN
INTRAMUSCULAR | Status: DC | PRN
  Administered 2019-05-18: 25 ug via INTRAVENOUS

## 2019-05-18 MED ORDER — SODIUM CHLORIDE 0.9 % WEIGHT BASED INFUSION
3.0000 mL/kg/h | INTRAVENOUS | Status: DC
  Administered 2019-05-18: 3 mL/kg/h via INTRAVENOUS

## 2019-05-18 MED ORDER — POTASSIUM CHLORIDE 2 MEQ/ML IV SOLN
80.0000 meq | INTRAVENOUS | Status: DC
  Filled 2019-05-18 (×2): qty 40

## 2019-05-18 MED ORDER — LIDOCAINE HCL (PF) 1 % IJ SOLN
INTRAMUSCULAR | Status: AC
  Filled 2019-05-18: qty 30

## 2019-05-18 MED ORDER — SODIUM CHLORIDE 0.9 % WEIGHT BASED INFUSION: 1.0000 mL/kg/h | INTRAVENOUS | Status: DC

## 2019-05-18 MED ORDER — SODIUM CHLORIDE 0.9 % IV SOLN
1.5000 g | INTRAVENOUS | Status: DC
  Filled 2019-05-18 (×2): qty 1.5

## 2019-05-18 MED ORDER — NITROGLYCERIN 0.4 MG SL SUBL
SUBLINGUAL_TABLET | SUBLINGUAL | Status: DC | PRN
  Administered 2019-05-18: .4 mg via SUBLINGUAL

## 2019-05-18 MED ORDER — SODIUM CHLORIDE 0.9% IV SOLUTION: Freq: Once | INTRAVENOUS | Status: DC

## 2019-05-18 MED ORDER — PLASMA-LYTE 148 IV SOLN
INTRAVENOUS | Status: AC
  Administered 2019-05-19: 500 mL
  Filled 2019-05-18 (×2): qty 2.5

## 2019-05-18 MED ORDER — ATORVASTATIN CALCIUM 10 MG PO TABS
20.0000 mg | ORAL_TABLET | Freq: Every day | ORAL | Status: DC
  Administered 2019-05-20 – 2019-05-26 (×7): 20 mg via ORAL
  Filled 2019-05-18 (×7): qty 2

## 2019-05-18 MED ORDER — HEPARIN (PORCINE) IN NACL 1000-0.9 UT/500ML-% IV SOLN
INTRAVENOUS | Status: DC | PRN
  Administered 2019-05-18 (×2): 500 mL via INTRA_ARTERIAL

## 2019-05-18 MED ORDER — NITROGLYCERIN 0.4 MG SL SUBL: 0.4000 mg | SUBLINGUAL_TABLET | SUBLINGUAL | Status: DC | PRN

## 2019-05-18 MED ORDER — CHLORHEXIDINE GLUCONATE CLOTH 2 % EX PADS
6.0000 | MEDICATED_PAD | Freq: Once | CUTANEOUS | Status: AC
  Administered 2019-05-19: 6 via TOPICAL

## 2019-05-18 MED ORDER — BISACODYL 5 MG PO TBEC: 5.0000 mg | DELAYED_RELEASE_TABLET | Freq: Once | ORAL | Status: DC

## 2019-05-18 MED ORDER — POLYVINYL ALCOHOL 1.4 % OP SOLN
1.0000 [drp] | Freq: Every day | OPHTHALMIC | Status: DC
  Administered 2019-05-22 – 2019-05-26 (×4): 1 [drp] via OPHTHALMIC
  Filled 2019-05-18: qty 15

## 2019-05-18 MED ORDER — DOXAZOSIN MESYLATE 8 MG PO TABS
8.0000 mg | ORAL_TABLET | Freq: Every day | ORAL | Status: DC
  Administered 2019-05-18: 8 mg via ORAL
  Filled 2019-05-18 (×2): qty 1

## 2019-05-18 MED ORDER — NITROGLYCERIN IN D5W 200-5 MCG/ML-% IV SOLN
2.0000 ug/min | INTRAVENOUS | Status: DC
  Filled 2019-05-18 (×2): qty 250

## 2019-05-18 MED ORDER — TRANEXAMIC ACID (OHS) BOLUS VIA INFUSION
15.0000 mg/kg | INTRAVENOUS | Status: AC
  Administered 2019-05-19: 09:00:00 1188 mg via INTRAVENOUS
  Filled 2019-05-18: qty 1188

## 2019-05-18 MED ORDER — HYDRALAZINE HCL 20 MG/ML IJ SOLN: 10.0000 mg | INTRAMUSCULAR | Status: AC | PRN

## 2019-05-18 SURGICAL SUPPLY — 12 items
CATH INFINITI 5FR JL4 (CATHETERS) ×2 IMPLANT
CATH INFINITI JR4 5F (CATHETERS) ×2 IMPLANT
CATH OPTITORQUE TIG 4.0 5F (CATHETERS) ×2 IMPLANT
DEVICE RAD COMP TR BAND LRG (VASCULAR PRODUCTS) ×2 IMPLANT
ELECT DEFIB PAD ADLT CADENCE (PAD) ×2 IMPLANT
GLIDESHEATH SLEND A-KIT 6F 22G (SHEATH) ×2 IMPLANT
GUIDEWIRE INQWIRE 1.5J.035X260 (WIRE) ×1 IMPLANT
INQWIRE 1.5J .035X260CM (WIRE) ×2
KIT HEART LEFT (KITS) ×2 IMPLANT
PACK CARDIAC CATHETERIZATION (CUSTOM PROCEDURE TRAY) ×2 IMPLANT
TRANSDUCER W/STOPCOCK (MISCELLANEOUS) ×2 IMPLANT
TUBING CIL FLEX 10 FLL-RA (TUBING) ×2 IMPLANT

## 2019-05-18 NOTE — Consult Note (Signed)
[address]       [place] [address]             [phone]        Joseph Byrd [facility] Medical Record [id number] Date of Birth: 1933-07-07  Referring: No ref. provider found Primary Care: Dettinger, [name], MD Primary Cardiologist:Jay [name], MD  Chief Complaint:   No chief complaint on file.   History of Present Illness:     85 yo male admitted following an elective LHC which showed LM stenosis with ulcerated plaque.  He originally presented in November of this year with anginal symptoms.  He was medically optimized, and instructed to return today for his Cath.  During the procedure, he develop some chest pain, but this stabilized.    In regards to his previous symptoms, he has had some unstable angina over the last several weeks, and exertional angina over the last year.  He denies any lower extremity edema, or orthopnea.  He denies any neurologic symptoms.     Past Medical and Surgical History: Previous Chest Surgery: no Previous Chest Radiation: no Diabetes Mellitus: no.  HbA1C 5 Creatinine: pending  Past Medical History:  Diagnosis Date  . Arthritis    "knees, wrists, knuckles" (04/26/2014)  . CAD (coronary artery disease)   . Headache    "had a couple/wk til ~ 2 wks ago" (04/26/2014)  . Hyperlipidemia   . Hypertension   . LBBB (left bundle branch block) 07/06/2018  . PAD (peripheral artery disease) ([place])   . Type II diabetes mellitus ([place])     Past Surgical History:  Procedure Laterality Date  . BACK SURGERY    . CATARACT EXTRACTION W/ INTRAOCULAR LENS  IMPLANT, BILATERAL Bilateral ~ 2014  . CORONARY ANGIOPLASTY WITH STENT PLACEMENT  08/2010; 04/26/2014   "1, LAD; 1"  . LEFT HEART CATHETERIZATION WITH CORONARY ANGIOGRAM N/A 04/26/2014   Procedure: LEFT HEART CATHETERIZATION WITH CORONARY ANGIOGRAM;  Surgeon: Laverda Page, MD;  Location: [facility] CATH LAB;  Service: Cardiovascular;  Laterality: N/A;  . LUMBAR  LAMINECTOMY/DECOMPRESSION MICRODISCECTOMY  01/2010    Social History: Support: lives with wife.  Child in [place] History   Tobacco Use  Smoking Status Former Smoker  . Packs/day: 0.12  . Years: 10.00  . Pack years: 1.20  . Types: Cigarettes  . Quit date: 09/04/1982  . Years since quitting: 36.7  Smokeless Tobacco Never Used    Social History   Substance and Sexual Activity  Alcohol Use No     No Known Allergies  Medications: Asprin: yes Statin: yes Beta Blocker: yes Ace Inhibitor: no Anti-Coagulation: pletal  Current Facility-Administered Medications  Medication Dose Route Frequency Provider Last Rate Last Admin  . 0.9 %  sodium chloride infusion  250 mL Intravenous PRN [place] Prows, MD      . 0.9% sodium chloride infusion  1 mL/kg/hr Intravenous Continuous Adrian Prows, MD 79.2 mL/hr at 05/18/19 0734 1 mL/kg/hr at 05/18/19 0734  . 0.9% sodium chloride infusion  1 mL/kg/hr Intravenous Continuous Adrian Prows, MD      . aspirin chewable tablet 81 mg  81 mg Oral Adolph Pollack, MD      . heparin ADULT infusion 100 units/mL (25000 units/223mL sodium chloride 0.45%)  1,000 Units/hr Intravenous Continuous Einar Grad, RPH      . sodium chloride flush (NS) 0.9 % injection 3 mL  3 mL Intravenous Q12H Adrian Prows, MD      .  sodium chloride flush (NS) 0.9 % injection 3 mL  3 mL Intravenous PRN Adrian Prows, MD        Medications Prior to Admission  Medication Sig Dispense Refill Last Dose  . acetaminophen (TYLENOL) 325 MG tablet Take 650 mg by mouth every 6 (six) hours as needed for moderate pain or headache.   05/17/2019 at Unknown time  . amLODIPine-Valsartan-HCTZ 10-160-25 MG TABS Take 1 tablet by mouth daily. 90 tablet 1 05/18/2019 at 0500  . aspirin EC 81 MG tablet Take 81 mg by mouth every morning.   05/18/2019 at 0500  . atorvastatin (LIPITOR) 20 MG tablet Take 1 tablet (20 mg total) by mouth daily. 90 tablet 3 05/18/2019 at 0500  . cilostazol (PLETAL) 100  MG tablet Take 1 tablet (100 mg total) by mouth 2 (two) times daily. 180 tablet 3 05/18/2019 at 0500  . donepezil (ARICEPT) 10 MG tablet Take 10 mg by mouth at bedtime.    05/17/2019 at Unknown time  . doxazosin (CARDURA) 8 MG tablet Take 1 tablet (8 mg total) by mouth at bedtime. 90 tablet 3 05/17/2019 at Unknown time  . Multiple Vitamin (MULTIVITAMIN WITH MINERALS) TABS tablet Take 1 tablet by mouth daily.   05/18/2019 at 0500  . nitroGLYCERIN (NITROSTAT) 0.4 MG SL tablet DISSOLVE 1 TABLET UNDER TONGUE EVERY 5 MIN AS NEEDED FOR CHEST PAIN, MAX 3 DOSES IN 15 MIN (Patient taking differently: Place 0.4 mg under the tongue every 5 (five) minutes as needed for chest pain. ) 25 tablet 1 Past Month at Unknown time  . Polyethyl Glycol-Propyl Glycol (SYSTANE OP) Place 1 drop into both eyes daily.   05/17/2019 at Unknown time  . isosorbide mononitrate (IMDUR) 30 MG 24 hr tablet Take 1 tablet (30 mg total) by mouth daily. 30 tablet 2   . metoprolol succinate (TOPROL-XL) 25 MG 24 hr tablet Take 1 tablet (25 mg total) by mouth daily. Take with or immediately following a meal. 30 tablet 2     Family History  Problem Relation Age of Onset  . Cancer Sister      Review of Systems:   Review of Systems  Constitutional: Positive for malaise/fatigue.  HENT: Negative.   Eyes: Negative.   Respiratory: Positive for shortness of breath.   Cardiovascular: Positive for chest pain and claudication. Negative for orthopnea and leg swelling.  Musculoskeletal: Negative.   Skin: Negative.   Neurological: Negative.       Physical Exam: BP 123/68   Pulse (!) 52   Temp 98.1 F (36.7 C) (Skin)   Resp (!) 21   Ht 5\' 9"  (1.753 m)   SpO2 100%   BMI 25.77 kg/m  Physical Exam  Constitutional: He is oriented to person, place, and time and well-developed, well-nourished, and in no distress. No distress.  HENT:  Head: Normocephalic and atraumatic.  Eyes: Conjunctivae are normal. No scleral icterus.  Neck: No  tracheal deviation present.  Cardiovascular:  Bradycardic   Pulmonary/Chest: Effort normal. No respiratory distress.  Abdominal: He exhibits no distension.  Neurological: He is alert and oriented to person, place, and time.  Skin: Skin is warm and dry. He is not diaphoretic.      Diagnostic Studies & Laboratory data:    Left Heart Catherization: Good distal targets.  LM stenosis, with ulcerated lesion.  Will need OM1, RI, D1, and LAD Echo: Per report, good heart function, mild aortic and mitral disease.  ? Of interatrial shunt   I have independently reviewed the  above radiologic studies and discussed with the patient   Recent Lab Findings: Lab Results  Component Value Date   WBC 5.8 04/27/2019   HGB 11.8 (L) 04/27/2019   HCT 39.6 04/27/2019   PLT 604 (H) 04/27/2019   GLUCOSE 112 (H) 04/27/2019   CHOL 124 05/14/2019   TRIG 41 05/14/2019   HDL 64 05/14/2019   LDLCALC 50 05/14/2019   ALT 15 03/29/2018   AST 26 03/29/2018   NA 136 04/27/2019   K 3.8 04/27/2019   CL 102 04/27/2019   CREATININE 1.10 04/27/2019   BUN 20 04/27/2019   CO2 28 04/27/2019   TSH 1.530 05/14/2019   INR 1.15 03/19/2018   HGBA1C 5.0 06/22/2018      Assessment / Plan:   85 yo male with LM disease and unstable angina.  He has been on pletal, but given the progression of the ulcer, we will plan to perform surgery on 12/16.  I have explained to him that he is at increased risk of bleeding, but we will have plts available.  CABG 4 tomorrow   I  spent 30 minutes counseling the patient face to face.   Lajuana Matte 05/18/2019 10:56 AM

## 2019-05-18 NOTE — Interval H&P Note (Signed)
History and Physical Interval Note:  05/18/2019 7:47 AM  Joseph Byrd  has presented today for surgery, with the diagnosis of coronary artery disease, shortness of breath.  The various methods of treatment have been discussed with the patient and family. After consideration of risks, benefits and other options for treatment, the patient has consented to  Procedure(s): LEFT HEART CATH AND CORONARY ANGIOGRAPHY (N/A) as a surgical intervention.  The patient's history has been reviewed, patient examined, no change in status, stable for surgery.  I have reviewed the patient's chart and labs.  Questions were answered to the patient's satisfaction.   Symptom Status: Ischemic Symptoms Non-invasive Testing: Indeterminate If no or indeterminate stress test, FFR/iFR results in all diseased vessels: Not done Diabetes Mellitus: Yes S/P CABG: No Antianginal therapy (number of long-acting drugs): >=2 Patient undergoing renal transplant: No Patient undergoing percutaneous valve procedure: No   1 Vessel Disease No proximal LAD involvement, No proximal left dominant LCX involvement  PCI: Not rated  CABG: Not rated Proximal left dominant LCX involvement  PCI: Not rated  CABG: Not rated Proximal LAD involvement  PCI: Not rated  CABG: Not rated  2 Vessel Disease No proximal LAD involvement  PCI: Not rated  CABG: Not rated Proximal LAD involvement  PCI: Not rated  CABG: Not rated  3 Vessel Disease Low disease complexity (e.g., focal stenoses, SYNTAX <=22)  PCI: Not rated  CABG: Not rated Intermediate or high disease complexity (e.g., SYNTAX >=23)  PCI: Not rated  CABG: Not rated  Left Main Disease Isolated LMCA disease: ostial or midshaft  PCI: A (7);  Indication 24  CABG: A (9);  Indication 24 Isolated LMCA disease: bifurcation involvement  PCI: M (6);  Indication 25  CABG: A (9);  Indication 25 LMCA ostial or midshaft, concurrent low disease burden multivessel disease (e.g., 1-2  additional focal stenoses, SYNTAX <=22)  PCI: A (7);  Indication 26  CABG: A (9);  Indication 26 LMCA ostial or midshaft, concurrent intermediate or high disease burden multivessel disease (e.g., 1-2 additional bifurcation stenoses, long stenoses, SYNTAX >=23)  PCI: M (4);  Indication 27  CABG: A (9);  Indication 27 LMCA bifurcation involvement, concurrent low disease burden multivessel disease (e.g., 1-2 additional focal stenoses, SYNTAX <=22)  PCI: M (6);  Indication 28  CABG: A (9);  Indication 28 LMCA bifurcation involvement, concurrent intermediate or high disease burden multivessel disease (e.g., 1-2 additional bifurcation stenoses, long stenoses, SYNTAX >=23)  PCI: R (3);  Indication 29  CABG: A (9);  Indication 29  Notes:  A indicates appropriate. M indicates may be appropriate. R indicates rarely appropriate. Number in parentheses is median score for that indication. Reclassify indicates number of functionally diseased vessels should be decreased given negative FFR/iFR. Re-evaluate the scenario interpreting any FFR/iFR negative vessel as being not significantly stenosed.  Disease means involved vessel provides flow to a sufficient amount of myocardium to be clinically important.  If FFR testing indicates a vessel is not significant, that vessel should not be considered diseased (and the patient should be reclassified with respect to extent of functionally significant disease).  Proximal LAD + proximal left dominant LCX is considered 3 vessel CAD  2 Vessel CAD with FFR/iFR abnormal in only 1 but not both is considered 1 vessel CAD  Disease complexity includes occlusion, bifurcation, trifurcation, ostial, >20 mm, tortuosity, calcification, thrombus  LMCA disease is >=50% by angiography, MLD <2.8 mm, MLA <6 mm2; MLA 6-7.5 mm2 requires further physiologic  See Table B for  risk stratification based on noninvasive testing  Journal of the [employer] of Cardiology Mar 2017, 23391; DOI:  10.1016/j.jacc.2017.02.001 [employer].de.[email or web address].full-text.pdf This App  2018 by the Society for Cardiovascular Angiography and Interventions   [place] Prows

## 2019-05-18 NOTE — Progress Notes (Signed)
I have updated Mrs Crispell about his status and made aware of need for urgent CABG but not emergent.  JG

## 2019-05-18 NOTE — Progress Notes (Signed)
Pre CABG vascular testing complete.  Please see CV proc tab for preliminary results. Lita Mains- RDMS, RVT 5:45 PM  05/18/2019

## 2019-05-18 NOTE — Progress Notes (Signed)
TR band taken off and bandaged with gauze and tegaderm. Site level zero, and palpable radial pulse.

## 2019-05-18 NOTE — Progress Notes (Signed)
ANTICOAGULATION CONSULT NOTE - Initial Consult  Pharmacy Consult for heparin Indication: unstable plaque  No Known Allergies  Patient Measurements: Height: 5\' 9"  (175.3 cm) IBW/kg (Calculated) : 70.7 Heparin Dosing Weight: 79kg  Vital Signs: Temp: 98.1 F (36.7 C) (12/15 0549) Temp Source: Skin (12/15 0549) BP: 153/78 (12/15 0549) Pulse Rate: 72 (12/15 0549)  Estimated Creatinine Clearance: 51.8 mL/min (by C-G formula based on SCr of 1.1 mg/dL).   Medical History: Past Medical History:  Diagnosis Date  . Arthritis    "knees, wrists, knuckles" (04/26/2014)  . CAD (coronary artery disease)   . Headache    "had a couple/wk til ~ 2 wks ago" (04/26/2014)  . Hyperlipidemia   . Hypertension   . LBBB (left bundle branch block) 07/06/2018  . PAD (peripheral artery disease) ([place])   . Type II diabetes mellitus ([facility])     Medications:  Medications Prior to Admission  Medication Sig Dispense Refill Last Dose  . acetaminophen (TYLENOL) 325 MG tablet Take 650 mg by mouth every 6 (six) hours as needed for moderate pain or headache.   05/17/2019 at Unknown time  . amLODIPine-Valsartan-HCTZ 10-160-25 MG TABS Take 1 tablet by mouth daily. 90 tablet 1 05/18/2019 at 0500  . aspirin EC 81 MG tablet Take 81 mg by mouth every morning.   05/18/2019 at 0500  . atorvastatin (LIPITOR) 20 MG tablet Take 1 tablet (20 mg total) by mouth daily. 90 tablet 3 05/18/2019 at 0500  . cilostazol (PLETAL) 100 MG tablet Take 1 tablet (100 mg total) by mouth 2 (two) times daily. 180 tablet 3 05/18/2019 at 0500  . donepezil (ARICEPT) 10 MG tablet Take 10 mg by mouth at bedtime.    05/17/2019 at Unknown time  . doxazosin (CARDURA) 8 MG tablet Take 1 tablet (8 mg total) by mouth at bedtime. 90 tablet 3 05/17/2019 at Unknown time  . Multiple Vitamin (MULTIVITAMIN WITH MINERALS) TABS tablet Take 1 tablet by mouth daily.   05/18/2019 at 0500  . nitroGLYCERIN (NITROSTAT) 0.4 MG SL tablet DISSOLVE 1 TABLET UNDER TONGUE  EVERY 5 MIN AS NEEDED FOR CHEST PAIN, MAX 3 DOSES IN 15 MIN (Patient taking differently: Place 0.4 mg under the tongue every 5 (five) minutes as needed for chest pain. ) 25 tablet 1 Past Month at Unknown time  . Polyethyl Glycol-Propyl Glycol (SYSTANE OP) Place 1 drop into both eyes daily.   05/17/2019 at Unknown time  . isosorbide mononitrate (IMDUR) 30 MG 24 hr tablet Take 1 tablet (30 mg total) by mouth daily. 30 tablet 2   . metoprolol succinate (TOPROL-XL) 25 MG 24 hr tablet Take 1 tablet (25 mg total) by mouth daily. Take with or immediately following a meal. 30 tablet 2    Scheduled:  . aspirin  81 mg Oral Pre-Cath  . sodium chloride flush  3 mL Intravenous Q12H   Infusions:  . sodium chloride    . sodium chloride 1 mL/kg/hr (05/18/19 0734)    Assessment: 85yo male to begin heparin immediately post-cath per Dr [name] for unstable plaque, awaiting CVTS consult.  Goal of Therapy:  Heparin level 0.3-0.7 units/ml Monitor platelets by anticoagulation protocol: Yes   Plan:  Will begin heparin gtt at 1000 units/hr and monitor heparin levels and CBC.  Wynona Neat, PharmD, BCPS  05/18/2019,8:25 AM

## 2019-05-18 NOTE — Anesthesia Preprocedure Evaluation (Addendum)
Anesthesia Evaluation  Patient identified by MRN, date of birth, ID band Patient awake    Reviewed: Allergy & Precautions, NPO status , Patient's Chart, lab work & pertinent test results  Airway Mallampati: II  TM Distance: >3 FB Neck ROM: Full    Dental  (+) Teeth Intact, Dental Advisory Given   Pulmonary former smoker,    Pulmonary exam normal breath sounds clear to auscultation       Cardiovascular hypertension, Pt. on medications and Pt. on home beta blockers + angina + CAD and + Peripheral Vascular Disease  Normal cardiovascular exam+ dysrhythmias (LBBB)  Rhythm:Regular Rate:Normal  Echocardiogram 05/14/2019: Left ventricle cavity is normal in size. Mild concentric hypertrophy of the left ventricle. Normal LV systolic function with EF 55%. Normal global wall motion. Doppler evidence of grade I (impaired) diastolic dysfunction, normal LAP.  Left atrial cavity is mildly dilated. Aneurysmal interatrial septum without 2D or color Doppler evidence of interatrial shunt. Indeterminate number of aortic valve leaflets. Moderate calcification. Trace aortic stenosis. Trace aortic regurgitation. Aortic valve mean gradient of 7 mmHg, Vmax of 2 m/s. Calculated aortic valve area by continuity equation is 2.0  cm. Mild (Grade I) mitral regurgitation. Mild tricuspid regurgitation. Estimated pulmonary artery systolic pressure is 24 mmHg.   Neuro/Psych  Headaches, negative psych ROS   GI/Hepatic negative GI ROS, Neg liver ROS,   Endo/Other  diabetes, Type 2  Renal/GU negative Renal ROS     Musculoskeletal  (+) Arthritis ,   Abdominal   Peds  Hematology  (+) Blood dyscrasia, anemia ,   Anesthesia Other Findings Day of surgery medications reviewed with the patient.  Reproductive/Obstetrics                            Anesthesia Physical Anesthesia Plan  ASA: IV  Anesthesia Plan: General   Post-op Pain  Management:    Induction: Intravenous  PONV Risk Score and Plan: 2 and Treatment may vary due to age or medical condition  Airway Management Planned: Oral ETT  Additional Equipment: Arterial line, CVP, PA Cath, TEE and Ultrasound Guidance Line Placement  Intra-op Plan:   Post-operative Plan: Post-operative intubation/ventilation  Informed Consent: I have reviewed the patients History and Physical, chart, labs and discussed the procedure including the risks, benefits and alternatives for the proposed anesthesia with the patient or authorized representative who has indicated his/her understanding and acceptance.     Dental advisory given  Plan Discussed with: CRNA  Anesthesia Plan Comments:        Anesthesia Quick Evaluation

## 2019-05-18 NOTE — Progress Notes (Addendum)
ANTICOAGULATION CONSULT NOTE - [place] for heparin Indication: unstable plaque  No Known Allergies  Patient Measurements: Height: 5\' 9"  (175.3 cm) IBW/kg (Calculated) : 70.7 Heparin Dosing Weight: 79kg  Vital Signs: Temp: 98.1 F (36.7 C) (12/15 0549) Temp Source: Skin (12/15 0549) BP: 123/68 (12/15 1035) Pulse Rate: 52 (12/15 1035)  Estimated Creatinine Clearance: 51.8 mL/min (by C-G formula based on SCr of 1.1 mg/dL).   Medical History: Past Medical History:  Diagnosis Date  . Arthritis    "knees, wrists, knuckles" (04/26/2014)  . CAD (coronary artery disease)   . Headache    "had a couple/wk til ~ 2 wks ago" (04/26/2014)  . Hyperlipidemia   . Hypertension   . LBBB (left bundle branch block) 07/06/2018  . PAD (peripheral artery disease) ([place])   . Type II diabetes mellitus ([facility])     Medications:  Medications Prior to Admission  Medication Sig Dispense Refill Last Dose  . acetaminophen (TYLENOL) 325 MG tablet Take 650 mg by mouth every 6 (six) hours as needed for moderate pain or headache.   05/17/2019 at Unknown time  . amLODIPine-Valsartan-HCTZ 10-160-25 MG TABS Take 1 tablet by mouth daily. 90 tablet 1 05/18/2019 at 0500  . aspirin EC 81 MG tablet Take 81 mg by mouth every morning.   05/18/2019 at 0500  . atorvastatin (LIPITOR) 20 MG tablet Take 1 tablet (20 mg total) by mouth daily. 90 tablet 3 05/18/2019 at 0500  . cilostazol (PLETAL) 100 MG tablet Take 1 tablet (100 mg total) by mouth 2 (two) times daily. 180 tablet 3 05/18/2019 at 0500  . donepezil (ARICEPT) 10 MG tablet Take 10 mg by mouth at bedtime.    05/17/2019 at Unknown time  . doxazosin (CARDURA) 8 MG tablet Take 1 tablet (8 mg total) by mouth at bedtime. 90 tablet 3 05/17/2019 at Unknown time  . Multiple Vitamin (MULTIVITAMIN WITH MINERALS) TABS tablet Take 1 tablet by mouth daily.   05/18/2019 at 0500  . nitroGLYCERIN (NITROSTAT) 0.4 MG SL tablet DISSOLVE 1 TABLET UNDER TONGUE  EVERY 5 MIN AS NEEDED FOR CHEST PAIN, MAX 3 DOSES IN 15 MIN (Patient taking differently: Place 0.4 mg under the tongue every 5 (five) minutes as needed for chest pain. ) 25 tablet 1 Past Month at Unknown time  . Polyethyl Glycol-Propyl Glycol (SYSTANE OP) Place 1 drop into both eyes daily.   05/17/2019 at Unknown time  . isosorbide mononitrate (IMDUR) 30 MG 24 hr tablet Take 1 tablet (30 mg total) by mouth daily. 30 tablet 2   . metoprolol succinate (TOPROL-XL) 25 MG 24 hr tablet Take 1 tablet (25 mg total) by mouth daily. Take with or immediately following a meal. 30 tablet 2    Scheduled:  . aspirin  81 mg Oral Pre-Cath  . sodium chloride flush  3 mL Intravenous Q12H   Infusions:  . sodium chloride    . sodium chloride 1 mL/kg/hr (05/18/19 0734)  . sodium chloride      Assessment: 84yoM with unstable L main plaque to begin IV heparin 4hr after sheath pull in anticipation of urgent CABG tomorrow 12/16. Sheath out ~0830, no anticoagulation PTA.  Goal of Therapy:  Heparin level 0.3-0.7 units/ml Monitor platelets by anticoagulation protocol: Yes   Plan:  -Start heparin 1000 units/h no bolus at 1230 -Check heparin level in 8hr -CABG tomorrow   ADDENDUM: Per RN, MD wants heparin to start 4hr after TR band off (1330), so will retime heparin to begin  1730 with no bolus. CABG tomorrow so will defer heparin level check.   Arrie Senate, PharmD, BCPS Clinical Pharmacist [phone] Please check AMION for all [facility] numbers 05/18/2019

## 2019-05-19 ENCOUNTER — Inpatient Hospital Stay (HOSPITAL_COMMUNITY): Payer: Medicare HMO

## 2019-05-19 ENCOUNTER — Encounter (HOSPITAL_COMMUNITY)
Admission: AD | Disposition: A | Discharge status: Home / Self Care | Payer: Self-pay | Source: Home / Self Care | Attending: Thoracic Surgery (Cardiothoracic Vascular Surgery)

## 2019-05-19 ENCOUNTER — Inpatient Hospital Stay (HOSPITAL_COMMUNITY): Payer: Medicare HMO | Admitting: Certified Registered Nurse Anesthetist

## 2019-05-19 ENCOUNTER — Other Ambulatory Visit: Payer: Medicare HMO

## 2019-05-19 DIAGNOSIS — Z951 Presence of aortocoronary bypass graft: Secondary | ICD-10-CM

## 2019-05-19 HISTORY — DX: Presence of aortocoronary bypass graft: Z95.1

## 2019-05-19 HISTORY — PX: CORONARY ARTERY BYPASS GRAFT: SHX141

## 2019-05-19 LAB — POCT I-STAT 7, (LYTES, BLD GAS, ICA,H+H)
Acid-base deficit: 1 mmol/L (ref 0.0–2.0)
Acid-base deficit: 2 mmol/L (ref 0.0–2.0)
Acid-base deficit: 1 mmol/L (ref 0.0–2.0)
Bicarbonate: 25.9 mmol/L (ref 20.0–28.0)
Bicarbonate: 23.6 mmol/L (ref 20.0–28.0)
Bicarbonate: 24 mmol/L (ref 20.0–28.0)
Calcium, Ion: 1.28 mmol/L (ref 1.15–1.40)
Calcium, Ion: 1.12 mmol/L — ABNORMAL LOW (ref 1.15–1.40)
Calcium, Ion: 1.04 mmol/L — ABNORMAL LOW (ref 1.15–1.40)
HCT: 28 % — ABNORMAL LOW (ref 39.0–52.0)
HCT: 26 % — ABNORMAL LOW (ref 39.0–52.0)
HCT: 27 % — ABNORMAL LOW (ref 39.0–52.0)
Hemoglobin: 9.5 g/dL — ABNORMAL LOW (ref 13.0–17.0)
Hemoglobin: 8.8 g/dL — ABNORMAL LOW (ref 13.0–17.0)
Hemoglobin: 9.2 g/dL — ABNORMAL LOW (ref 13.0–17.0)
O2 Saturation: 99 %
O2 Saturation: 98 %
O2 Saturation: 97 %
Patient temperature: 35.7
Patient temperature: 35.8
Patient temperature: 36
Potassium: 3.4 mmol/L — ABNORMAL LOW (ref 3.5–5.1)
Potassium: 4.2 mmol/L (ref 3.5–5.1)
Potassium: 4.4 mmol/L (ref 3.5–5.1)
Sodium: 143 mmol/L (ref 135–145)
Sodium: 143 mmol/L (ref 135–145)
Sodium: 147 mmol/L — ABNORMAL HIGH (ref 135–145)
TCO2: 27 mmol/L (ref 22–32)
TCO2: 25 mmol/L (ref 22–32)
TCO2: 25 mmol/L (ref 22–32)
pCO2 arterial: 49.3 mmHg — ABNORMAL HIGH (ref 32.0–48.0)
pCO2 arterial: 40.1 mmHg (ref 32.0–48.0)
pCO2 arterial: 39.8 mmHg (ref 32.0–48.0)
pH, Arterial: 7.322 — ABNORMAL LOW (ref 7.350–7.450)
pH, Arterial: 7.372 (ref 7.350–7.450)
pH, Arterial: 7.383 (ref 7.350–7.450)
pO2, Arterial: 130 mmHg — ABNORMAL HIGH (ref 83.0–108.0)
pO2, Arterial: 112 mmHg — ABNORMAL HIGH (ref 83.0–108.0)
pO2, Arterial: 91 mmHg (ref 83.0–108.0)

## 2019-05-19 LAB — PROTIME-INR
INR: 1.2 (ref 0.8–1.2)
INR: 1.6 — ABNORMAL HIGH (ref 0.8–1.2)
Prothrombin Time: 15 seconds (ref 11.4–15.2)
Prothrombin Time: 18.9 seconds — ABNORMAL HIGH (ref 11.4–15.2)

## 2019-05-19 LAB — ABO/RH: ABO/RH(D): A POS

## 2019-05-19 LAB — GLUCOSE, CAPILLARY
Glucose-Capillary: 95 mg/dL (ref 70–99)
Glucose-Capillary: 106 mg/dL — ABNORMAL HIGH (ref 70–99)
Glucose-Capillary: 112 mg/dL — ABNORMAL HIGH (ref 70–99)
Glucose-Capillary: 153 mg/dL — ABNORMAL HIGH (ref 70–99)
Glucose-Capillary: 114 mg/dL — ABNORMAL HIGH (ref 70–99)
Glucose-Capillary: 174 mg/dL — ABNORMAL HIGH (ref 70–99)
Glucose-Capillary: 186 mg/dL — ABNORMAL HIGH (ref 70–99)
Glucose-Capillary: 167 mg/dL — ABNORMAL HIGH (ref 70–99)
Glucose-Capillary: 189 mg/dL — ABNORMAL HIGH (ref 70–99)

## 2019-05-19 LAB — CBC
HCT: 37.1 % — ABNORMAL LOW (ref 39.0–52.0)
HCT: 27.2 % — ABNORMAL LOW (ref 39.0–52.0)
HCT: 28.4 % — ABNORMAL LOW (ref 39.0–52.0)
Hemoglobin: 11.9 g/dL — ABNORMAL LOW (ref 13.0–17.0)
Hemoglobin: 8.7 g/dL — ABNORMAL LOW (ref 13.0–17.0)
Hemoglobin: 9 g/dL — ABNORMAL LOW (ref 13.0–17.0)
MCH: 30.6 pg (ref 26.0–34.0)
MCH: 31.2 pg (ref 26.0–34.0)
MCH: 30.8 pg (ref 26.0–34.0)
MCHC: 32.1 g/dL (ref 30.0–36.0)
MCHC: 32 g/dL (ref 30.0–36.0)
MCHC: 31.7 g/dL (ref 30.0–36.0)
MCV: 95.4 fL (ref 80.0–100.0)
MCV: 97.5 fL (ref 80.0–100.0)
MCV: 97.3 fL (ref 80.0–100.0)
Platelets: 605 10*3/uL — ABNORMAL HIGH (ref 150–400)
Platelets: 371 10*3/uL (ref 150–400)
Platelets: 458 10*3/uL — ABNORMAL HIGH (ref 150–400)
RBC: 3.89 MIL/uL — ABNORMAL LOW (ref 4.22–5.81)
RBC: 2.79 MIL/uL — ABNORMAL LOW (ref 4.22–5.81)
RBC: 2.92 MIL/uL — ABNORMAL LOW (ref 4.22–5.81)
RDW: 13.1 % (ref 11.5–15.5)
RDW: 13 % (ref 11.5–15.5)
RDW: 13 % (ref 11.5–15.5)
WBC: 6.3 10*3/uL (ref 4.0–10.5)
WBC: 12.9 10*3/uL — ABNORMAL HIGH (ref 4.0–10.5)
WBC: 14.6 10*3/uL — ABNORMAL HIGH (ref 4.0–10.5)
nRBC: 0 % (ref 0.0–0.2)
nRBC: 0 % (ref 0.0–0.2)
nRBC: 0 % (ref 0.0–0.2)

## 2019-05-19 LAB — BASIC METABOLIC PANEL
Anion gap: 11 (ref 5–15)
Anion gap: 8 (ref 5–15)
BUN: 15 mg/dL (ref 8–23)
BUN: 14 mg/dL (ref 8–23)
CO2: 25 mmol/L (ref 22–32)
CO2: 22 mmol/L (ref 22–32)
Calcium: 8.9 mg/dL (ref 8.9–10.3)
Calcium: 8.4 mg/dL — ABNORMAL LOW (ref 8.9–10.3)
Chloride: 104 mmol/L (ref 98–111)
Chloride: 109 mmol/L (ref 98–111)
Creatinine, Ser: 1.11 mg/dL (ref 0.61–1.24)
Creatinine, Ser: 0.81 mg/dL (ref 0.61–1.24)
GFR calc Af Amer: >60 mL/min (ref >60)
GFR calc Af Amer: >60 mL/min (ref >60)
GFR calc non Af Amer: >60 mL/min (ref >60)
GFR calc non Af Amer: >60 mL/min (ref >60)
Glucose, Bld: 104 mg/dL — ABNORMAL HIGH (ref 70–99)
Glucose, Bld: 215 mg/dL — ABNORMAL HIGH (ref 70–99)
Potassium: 3.8 mmol/L (ref 3.5–5.1)
Potassium: 4.1 mmol/L (ref 3.5–5.1)
Sodium: 140 mmol/L (ref 135–145)
Sodium: 139 mmol/L (ref 135–145)

## 2019-05-19 LAB — MAGNESIUM: Magnesium: 3 mg/dL — ABNORMAL HIGH (ref 1.7–2.4)

## 2019-05-19 LAB — HEPARIN LEVEL (UNFRACTIONATED): Heparin Unfractionated: 0.11 IU/mL — ABNORMAL LOW (ref 0.30–0.70)

## 2019-05-19 LAB — HEMOGLOBIN AND HEMATOCRIT, BLOOD
HCT: 27.9 % — ABNORMAL LOW (ref 39.0–52.0)
Hemoglobin: 8.9 g/dL — ABNORMAL LOW (ref 13.0–17.0)

## 2019-05-19 LAB — SURGICAL PCR SCREEN
MRSA, PCR: NEGATIVE
Staphylococcus aureus: POSITIVE — AB

## 2019-05-19 LAB — HEMOGLOBIN A1C
Hgb A1c MFr Bld: 4.9 % (ref 4.8–5.6)
Mean Plasma Glucose: 93.93 mg/dL

## 2019-05-19 LAB — PLATELET COUNT: Platelets: 573 10*3/uL — ABNORMAL HIGH (ref 150–400)

## 2019-05-19 LAB — PREPARE RBC (CROSSMATCH)

## 2019-05-19 LAB — APTT
aPTT: 55 seconds — ABNORMAL HIGH (ref 24–36)
aPTT: 32 seconds (ref 24–36)

## 2019-05-19 SURGERY — CORONARY ARTERY BYPASS GRAFTING (CABG)
Anesthesia: General | Site: Chest

## 2019-05-19 MED ORDER — NICARDIPINE HCL IN NACL 20-0.86 MG/200ML-% IV SOLN
0.0000 mg/h | INTRAVENOUS | Status: DC
  Administered 2019-05-19: 5 mg/h via INTRAVENOUS
  Filled 2019-05-19: qty 200

## 2019-05-19 MED ORDER — ACETAMINOPHEN 160 MG/5ML PO SOLN: 650.0000 mg | Freq: Once | ORAL | Status: DC

## 2019-05-19 MED ORDER — ALBUMIN HUMAN 5 % IV SOLN: INTRAVENOUS | Status: DC | PRN

## 2019-05-19 MED ORDER — METOPROLOL TARTRATE 5 MG/5ML IV SOLN: 2.5000 mg | INTRAVENOUS | Status: DC | PRN

## 2019-05-19 MED ORDER — SODIUM CHLORIDE 0.9 % IV SOLN: INTRAVENOUS | Status: DC | PRN

## 2019-05-19 MED ORDER — VANCOMYCIN HCL IN DEXTROSE 1-5 GM/200ML-% IV SOLN
1000.0000 mg | Freq: Once | INTRAVENOUS | Status: AC
  Administered 2019-05-19: 1000 mg via INTRAVENOUS
  Filled 2019-05-19: qty 200

## 2019-05-19 MED ORDER — MIDAZOLAM HCL 2 MG/2ML IJ SOLN: 2.0000 mg | INTRAMUSCULAR | Status: DC | PRN

## 2019-05-19 MED ORDER — BISACODYL 5 MG PO TBEC
10.0000 mg | DELAYED_RELEASE_TABLET | Freq: Every day | ORAL | Status: DC
  Administered 2019-05-20 – 2019-05-26 (×7): 10 mg via ORAL
  Filled 2019-05-19 (×7): qty 2

## 2019-05-19 MED ORDER — HEPARIN SODIUM (PORCINE) 1000 UNIT/ML IJ SOLN
INTRAMUSCULAR | Status: DC | PRN
  Administered 2019-05-19: 27000 [IU] via INTRAVENOUS

## 2019-05-19 MED ORDER — DOXAZOSIN MESYLATE 4 MG PO TABS
8.0000 mg | ORAL_TABLET | Freq: Every day | ORAL | Status: DC
  Administered 2019-05-20 – 2019-05-22 (×3): 8 mg via ORAL
  Filled 2019-05-19: qty 2
  Filled 2019-05-19 (×3): qty 1

## 2019-05-19 MED ORDER — 0.9 % SODIUM CHLORIDE (POUR BTL) OPTIME
TOPICAL | Status: DC | PRN
  Administered 2019-05-19: 5000 mL

## 2019-05-19 MED ORDER — PHENYLEPHRINE HCL (PRESSORS) 10 MG/ML IV SOLN
INTRAVENOUS | Status: DC | PRN
  Administered 2019-05-19: 20 ug via INTRAVENOUS
  Administered 2019-05-19: 80 ug via INTRAVENOUS
  Administered 2019-05-19 (×2): 20 ug via INTRAVENOUS
  Administered 2019-05-19: 120 ug via INTRAVENOUS

## 2019-05-19 MED ORDER — MORPHINE SULFATE (PF) 2 MG/ML IV SOLN
1.0000 mg | INTRAVENOUS | Status: DC | PRN
  Administered 2019-05-19 – 2019-05-20 (×2): 2 mg via INTRAVENOUS
  Filled 2019-05-19 (×2): qty 1

## 2019-05-19 MED ORDER — SODIUM CHLORIDE 0.45 % IV SOLN: INTRAVENOUS | Status: DC | PRN

## 2019-05-19 MED ORDER — INSULIN REGULAR(HUMAN) IN NACL 100-0.9 UT/100ML-% IV SOLN: INTRAVENOUS | Status: DC

## 2019-05-19 MED ORDER — LACTATED RINGERS IV SOLN
500.0000 mL | Freq: Once | INTRAVENOUS | Status: AC | PRN
  Administered 2019-05-21: 500 mL via INTRAVENOUS

## 2019-05-19 MED ORDER — BISACODYL 10 MG RE SUPP: 10.0000 mg | Freq: Every day | RECTAL | Status: DC

## 2019-05-19 MED ORDER — DEXMEDETOMIDINE HCL IN NACL 400 MCG/100ML IV SOLN
0.0000 ug/kg/h | INTRAVENOUS | Status: DC
  Administered 2019-05-21: 0.3 ug/kg/h via INTRAVENOUS
  Filled 2019-05-19: qty 100

## 2019-05-19 MED ORDER — EPHEDRINE SULFATE 50 MG/ML IJ SOLN
INTRAMUSCULAR | Status: DC | PRN
  Administered 2019-05-19: 5 mg via INTRAVENOUS
  Administered 2019-05-19: 10 mg via INTRAVENOUS
  Administered 2019-05-19 (×3): 5 mg via INTRAVENOUS
  Administered 2019-05-19: 10 mg via INTRAVENOUS

## 2019-05-19 MED ORDER — PHENYLEPHRINE HCL (PRESSORS) 10 MG/ML IV SOLN
INTRAVENOUS | Status: AC
  Filled 2019-05-19: qty 2

## 2019-05-19 MED ORDER — MIDAZOLAM HCL 5 MG/5ML IJ SOLN
INTRAMUSCULAR | Status: DC | PRN
  Administered 2019-05-19: 3 mg via INTRAVENOUS
  Administered 2019-05-19 (×2): 1 mg via INTRAVENOUS
  Administered 2019-05-19: 2 mg via INTRAVENOUS
  Administered 2019-05-19: 3 mg via INTRAVENOUS

## 2019-05-19 MED ORDER — PANTOPRAZOLE SODIUM 40 MG PO TBEC
40.0000 mg | DELAYED_RELEASE_TABLET | Freq: Every day | ORAL | Status: DC
  Administered 2019-05-21 – 2019-05-26 (×6): 40 mg via ORAL
  Filled 2019-05-19 (×6): qty 1

## 2019-05-19 MED ORDER — SODIUM BICARBONATE 8.4 % IV SOLN
INTRAVENOUS | Status: AC
  Filled 2019-05-19: qty 100

## 2019-05-19 MED ORDER — DOBUTAMINE IN D5W 4-5 MG/ML-% IV SOLN
2.5000 ug/kg/min | INTRAVENOUS | Status: AC
  Administered 2019-05-19: 2.5 ug/kg/min via INTRAVENOUS
  Filled 2019-05-19: qty 250

## 2019-05-19 MED ORDER — DOCUSATE SODIUM 100 MG PO CAPS
200.0000 mg | ORAL_CAPSULE | Freq: Every day | ORAL | Status: DC
  Administered 2019-05-20 – 2019-05-26 (×6): 200 mg via ORAL
  Filled 2019-05-19 (×6): qty 2

## 2019-05-19 MED ORDER — ASPIRIN EC 325 MG PO TBEC
325.0000 mg | DELAYED_RELEASE_TABLET | Freq: Every day | ORAL | Status: DC
  Administered 2019-05-20 – 2019-05-26 (×6): 325 mg via ORAL
  Filled 2019-05-19 (×7): qty 1

## 2019-05-19 MED ORDER — ACETAMINOPHEN 160 MG/5ML PO SOLN: 1000.0000 mg | Freq: Four times a day (QID) | ORAL | Status: AC

## 2019-05-19 MED ORDER — MUPIROCIN 2 % EX OINT
1.0000 "application " | TOPICAL_OINTMENT | Freq: Two times a day (BID) | CUTANEOUS | Status: AC
  Administered 2019-05-19 – 2019-05-23 (×10): 1 via NASAL
  Filled 2019-05-19 (×4): qty 22

## 2019-05-19 MED ORDER — ROCURONIUM BROMIDE 10 MG/ML (PF) SYRINGE
PREFILLED_SYRINGE | INTRAVENOUS | Status: AC
  Filled 2019-05-19: qty 30

## 2019-05-19 MED ORDER — NOREPINEPHRINE 4 MG/250ML-% IV SOLN: 0.0000 ug/min | INTRAVENOUS | Status: DC

## 2019-05-19 MED ORDER — CHLORHEXIDINE GLUCONATE CLOTH 2 % EX PADS
6.0000 | MEDICATED_PAD | Freq: Every day | CUTANEOUS | Status: DC
  Administered 2019-05-20 – 2019-05-26 (×7): 6 via TOPICAL

## 2019-05-19 MED ORDER — CHLORHEXIDINE GLUCONATE 0.12 % MT SOLN
15.0000 mL | OROMUCOSAL | Status: AC
  Administered 2019-05-19: 15 mL via OROMUCOSAL

## 2019-05-19 MED ORDER — SODIUM CHLORIDE 0.9 % IV SOLN
INTRAVENOUS | Status: DC | PRN
  Administered 2019-05-19: 1.5 g via INTRAVENOUS
  Administered 2019-05-19: 13:00:00 .75 g via INTRAVENOUS

## 2019-05-19 MED ORDER — DEXTROSE 50 % IV SOLN: 0.0000 mL | INTRAVENOUS | Status: DC | PRN

## 2019-05-19 MED ORDER — METOPROLOL TARTRATE 12.5 MG HALF TABLET
12.5000 mg | ORAL_TABLET | Freq: Two times a day (BID) | ORAL | Status: DC
  Administered 2019-05-20 – 2019-05-26 (×10): 12.5 mg via ORAL
  Filled 2019-05-19 (×11): qty 1

## 2019-05-19 MED ORDER — ASPIRIN 81 MG PO CHEW
324.0000 mg | CHEWABLE_TABLET | Freq: Every day | ORAL | Status: DC
  Administered 2019-05-24: 324 mg
  Filled 2019-05-19: qty 4

## 2019-05-19 MED ORDER — TRAMADOL HCL 50 MG PO TABS
50.0000 mg | ORAL_TABLET | ORAL | Status: DC | PRN
  Administered 2019-05-19: 50 mg via ORAL
  Filled 2019-05-19: qty 1

## 2019-05-19 MED ORDER — PHENYLEPHRINE 40 MCG/ML (10ML) SYRINGE FOR IV PUSH (FOR BLOOD PRESSURE SUPPORT)
PREFILLED_SYRINGE | INTRAVENOUS | Status: AC
  Filled 2019-05-19: qty 10

## 2019-05-19 MED ORDER — HEMOSTATIC AGENTS (NO CHARGE) OPTIME
TOPICAL | Status: DC | PRN
  Administered 2019-05-19 (×2): 1 via TOPICAL

## 2019-05-19 MED ORDER — ARTIFICIAL TEARS OPHTHALMIC OINT
TOPICAL_OINTMENT | OPHTHALMIC | Status: DC | PRN
  Administered 2019-05-19: 1 via OPHTHALMIC

## 2019-05-19 MED ORDER — FAMOTIDINE IN NACL 20-0.9 MG/50ML-% IV SOLN
20.0000 mg | Freq: Two times a day (BID) | INTRAVENOUS | Status: AC
  Administered 2019-05-19: 20 mg via INTRAVENOUS

## 2019-05-19 MED ORDER — METOPROLOL TARTRATE 25 MG/10 ML ORAL SUSPENSION
12.5000 mg | Freq: Two times a day (BID) | ORAL | Status: DC
  Filled 2019-05-19 (×9): qty 5

## 2019-05-19 MED ORDER — ALBUMIN HUMAN 5 % IV SOLN
250.0000 mL | INTRAVENOUS | Status: AC | PRN
  Administered 2019-05-20 (×3): 12.5 g via INTRAVENOUS
  Filled 2019-05-19: qty 250

## 2019-05-19 MED ORDER — FENTANYL CITRATE (PF) 250 MCG/5ML IJ SOLN
INTRAMUSCULAR | Status: AC
  Filled 2019-05-19: qty 25

## 2019-05-19 MED ORDER — POTASSIUM CHLORIDE 10 MEQ/50ML IV SOLN
10.0000 meq | INTRAVENOUS | Status: AC
  Administered 2019-05-19 (×2): 10 meq via INTRAVENOUS

## 2019-05-19 MED ORDER — PROPOFOL 10 MG/ML IV BOLUS
INTRAVENOUS | Status: DC | PRN
  Administered 2019-05-19: 60 mg via INTRAVENOUS

## 2019-05-19 MED ORDER — SODIUM CHLORIDE 0.9 % IV SOLN: 250.0000 mL | INTRAVENOUS | Status: DC

## 2019-05-19 MED ORDER — SODIUM CHLORIDE 0.9 % IV SOLN
1.5000 g | Freq: Two times a day (BID) | INTRAVENOUS | Status: DC
  Filled 2019-05-19 (×2): qty 1.5

## 2019-05-19 MED ORDER — DOBUTAMINE IN D5W 4-5 MG/ML-% IV SOLN: 2.5000 ug/kg/min | INTRAVENOUS | Status: DC

## 2019-05-19 MED ORDER — NITROGLYCERIN IN D5W 200-5 MCG/ML-% IV SOLN: 0.0000 ug/min | INTRAVENOUS | Status: DC

## 2019-05-19 MED ORDER — PROTAMINE SULFATE 10 MG/ML IV SOLN
INTRAVENOUS | Status: DC | PRN
  Administered 2019-05-19: 270 mg via INTRAVENOUS

## 2019-05-19 MED ORDER — ONDANSETRON HCL 4 MG/2ML IJ SOLN
4.0000 mg | Freq: Four times a day (QID) | INTRAMUSCULAR | Status: DC | PRN
  Administered 2019-05-19: 4 mg via INTRAVENOUS
  Filled 2019-05-19: qty 2

## 2019-05-19 MED ORDER — OXYCODONE HCL 5 MG PO TABS
5.0000 mg | ORAL_TABLET | ORAL | Status: DC | PRN
  Administered 2019-05-22 – 2019-05-23 (×3): 5 mg via ORAL
  Filled 2019-05-19 (×4): qty 1

## 2019-05-19 MED ORDER — ACETAMINOPHEN 650 MG RE SUPP: 650.0000 mg | Freq: Once | RECTAL | Status: DC

## 2019-05-19 MED ORDER — ACETAMINOPHEN 500 MG PO TABS
1000.0000 mg | ORAL_TABLET | Freq: Four times a day (QID) | ORAL | Status: AC
  Administered 2019-05-20 – 2019-05-24 (×15): 1000 mg via ORAL
  Filled 2019-05-19 (×15): qty 2

## 2019-05-19 MED ORDER — FAMOTIDINE IN NACL 20-0.9 MG/50ML-% IV SOLN
INTRAVENOUS | Status: AC
  Filled 2019-05-19: qty 50

## 2019-05-19 MED ORDER — SODIUM CHLORIDE 0.9 % IV SOLN
1.5000 g | Freq: Two times a day (BID) | INTRAVENOUS | Status: AC
  Administered 2019-05-19 – 2019-05-21 (×4): 1.5 g via INTRAVENOUS
  Filled 2019-05-19 (×4): qty 1.5

## 2019-05-19 MED ORDER — ACETAMINOPHEN 650 MG RE SUPP
RECTAL | Status: AC
  Filled 2019-05-19: qty 1

## 2019-05-19 MED ORDER — FENTANYL CITRATE (PF) 100 MCG/2ML IJ SOLN
INTRAMUSCULAR | Status: DC | PRN
  Administered 2019-05-19: 50 ug via INTRAVENOUS
  Administered 2019-05-19: 100 ug via INTRAVENOUS
  Administered 2019-05-19: 200 ug via INTRAVENOUS
  Administered 2019-05-19: 100 ug via INTRAVENOUS
  Administered 2019-05-19 (×3): 50 ug via INTRAVENOUS
  Administered 2019-05-19 (×3): 100 ug via INTRAVENOUS

## 2019-05-19 MED ORDER — SODIUM CHLORIDE 0.9% FLUSH
3.0000 mL | Freq: Two times a day (BID) | INTRAVENOUS | Status: DC
  Administered 2019-05-20 – 2019-05-25 (×9): 3 mL via INTRAVENOUS

## 2019-05-19 MED ORDER — LIDOCAINE HCL (CARDIAC) PF 100 MG/5ML IV SOSY
PREFILLED_SYRINGE | INTRAVENOUS | Status: DC | PRN
  Administered 2019-05-19: 80 mg via INTRAVENOUS

## 2019-05-19 MED ORDER — ROCURONIUM BROMIDE 100 MG/10ML IV SOLN
INTRAVENOUS | Status: DC | PRN
  Administered 2019-05-19: 100 mg via INTRAVENOUS
  Administered 2019-05-19: 50 mg via INTRAVENOUS
  Administered 2019-05-19: 100 mg via INTRAVENOUS
  Administered 2019-05-19: 50 mg via INTRAVENOUS

## 2019-05-19 MED ORDER — PHENYLEPHRINE HCL-NACL 20-0.9 MG/250ML-% IV SOLN: 0.0000 ug/min | INTRAVENOUS | Status: DC

## 2019-05-19 MED ORDER — SODIUM CHLORIDE 0.9 % IV SOLN: INTRAVENOUS | Status: DC

## 2019-05-19 MED ORDER — SODIUM CHLORIDE 0.9% FLUSH
3.0000 mL | INTRAVENOUS | Status: DC | PRN
  Administered 2019-05-20: 3 mL via INTRAVENOUS

## 2019-05-19 MED ORDER — LACTATED RINGERS IV SOLN: INTRAVENOUS | Status: DC

## 2019-05-19 MED ORDER — PROPOFOL 10 MG/ML IV BOLUS
INTRAVENOUS | Status: AC
  Filled 2019-05-19: qty 20

## 2019-05-19 MED ORDER — MAGNESIUM SULFATE 4 GM/100ML IV SOLN
4.0000 g | Freq: Once | INTRAVENOUS | Status: AC
  Administered 2019-05-19: 4 g via INTRAVENOUS
  Filled 2019-05-19: qty 100

## 2019-05-19 MED ORDER — LACTATED RINGERS IV SOLN: INTRAVENOUS | Status: DC | PRN

## 2019-05-19 MED ORDER — MIDAZOLAM HCL (PF) 10 MG/2ML IJ SOLN
INTRAMUSCULAR | Status: AC
  Filled 2019-05-19: qty 2

## 2019-05-19 MED ORDER — POTASSIUM CHLORIDE 10 MEQ/50ML IV SOLN
INTRAVENOUS | Status: AC
  Administered 2019-05-19: 10 meq via INTRAVENOUS
  Filled 2019-05-19: qty 150

## 2019-05-19 MED ORDER — SUCCINYLCHOLINE CHLORIDE 200 MG/10ML IV SOSY
PREFILLED_SYRINGE | INTRAVENOUS | Status: AC
  Filled 2019-05-19: qty 20

## 2019-05-19 MED ORDER — ALBUMIN HUMAN 5 % IV SOLN
INTRAVENOUS | Status: AC
  Filled 2019-05-19: qty 500

## 2019-05-19 SURGICAL SUPPLY — 97 items
ADAPTER MULTI PERFUSION 15 (ADAPTER) ×2 IMPLANT
BAG DECANTER FOR FLEXI CONT (MISCELLANEOUS) ×4 IMPLANT
BASKET HEART  (ORDER IN 25'S) (MISCELLANEOUS) ×1
BASKET HEART (ORDER IN 25'S) (MISCELLANEOUS) ×1
BASKET HEART (ORDER IN 25S) (MISCELLANEOUS) ×1 IMPLANT
BLADE CLIPPER SURG (BLADE) ×3 IMPLANT
BLADE STERNUM SYSTEM 6 (BLADE) ×3 IMPLANT
BNDG ELASTIC 4X5.8 VLCR STR LF (GAUZE/BANDAGES/DRESSINGS) ×3 IMPLANT
BNDG ELASTIC 6X5.8 VLCR STR LF (GAUZE/BANDAGES/DRESSINGS) ×3 IMPLANT
BNDG GAUZE ELAST 4 BULKY (GAUZE/BANDAGES/DRESSINGS) ×3 IMPLANT
CABLE SURGICAL S-101-97-12 (CABLE) IMPLANT
CANISTER SUCT 3000ML PPV (MISCELLANEOUS) ×3 IMPLANT
CANNULA EZ GLIDE 8.0 24FR (CANNULA) ×3 IMPLANT
CANNULA MC2 2 STG 29/37 NON-V (CANNULA) ×1 IMPLANT
CANNULA MC2 TWO STAGE (CANNULA) ×2
CANNULA VESSEL 3MM BLUNT TIP (CANNULA) ×4 IMPLANT
CATH CPB KIT HENDRICKSON (MISCELLANEOUS) ×1 IMPLANT
CLIP RETRACTION 3.0MM CORONARY (MISCELLANEOUS) ×3 IMPLANT
CLIP VESOCCLUDE MED 24/CT (CLIP) ×2 IMPLANT
CLIP VESOCCLUDE SM WIDE 24/CT (CLIP) ×4 IMPLANT
CONN ST 1/2X1/2  BEN (MISCELLANEOUS)
CONN ST 1/2X1/2 BEN (MISCELLANEOUS) ×1 IMPLANT
CONNECTOR BLAKE 2:1 CARIO BLK (MISCELLANEOUS) ×3 IMPLANT
COVER WAND RF STERILE (DRAPES) ×1 IMPLANT
DRAIN CHANNEL 19F RND (DRAIN) ×6 IMPLANT
DRAIN CONNECTOR BLAKE 1:1 (MISCELLANEOUS) ×2 IMPLANT
DRAPE CARDIOVASCULAR INCISE (DRAPES) ×2
DRAPE INCISE IOBAN 66X45 STRL (DRAPES) ×1 IMPLANT
DRAPE SLUSH/WARMER DISC (DRAPES) ×3 IMPLANT
DRAPE SRG 135X102X78XABS (DRAPES) ×1 IMPLANT
DRSG AQUACEL AG ADV 3.5X14 (GAUZE/BANDAGES/DRESSINGS) ×2 IMPLANT
DRSG COVADERM 4X14 (GAUZE/BANDAGES/DRESSINGS) ×1 IMPLANT
ELECT REM PT RETURN 9FT ADLT (ELECTROSURGICAL) ×6
ELECTRODE REM PT RTRN 9FT ADLT (ELECTROSURGICAL) ×2 IMPLANT
FELT TEFLON 1X6 (MISCELLANEOUS) ×4 IMPLANT
GAUZE SPONGE 4X4 12PLY STRL (GAUZE/BANDAGES/DRESSINGS) ×6 IMPLANT
GLOVE BIO SURGEON STRL SZ 6.5 (GLOVE) ×1 IMPLANT
GLOVE BIO SURGEON STRL SZ7 (GLOVE) ×12 IMPLANT
GLOVE BIO SURGEONS STRL SZ 6.5 (GLOVE) ×1
GLOVE BIOGEL PI IND STRL 6 (GLOVE) IMPLANT
GLOVE BIOGEL PI IND STRL 6.5 (GLOVE) IMPLANT
GLOVE BIOGEL PI IND STRL 7.5 (GLOVE) ×2 IMPLANT
GLOVE BIOGEL PI INDICATOR 6 (GLOVE) ×4
GLOVE BIOGEL PI INDICATOR 6.5 (GLOVE) ×2
GLOVE BIOGEL PI INDICATOR 7.5 (GLOVE) ×4
GOWN STRL REUS W/ TWL LRG LVL3 (GOWN DISPOSABLE) ×4 IMPLANT
GOWN STRL REUS W/ TWL XL LVL3 (GOWN DISPOSABLE) ×2 IMPLANT
GOWN STRL REUS W/TWL LRG LVL3 (GOWN DISPOSABLE) ×8
GOWN STRL REUS W/TWL XL LVL3 (GOWN DISPOSABLE) ×4
HEMOSTAT POWDER SURGIFOAM 1G (HEMOSTASIS) ×7 IMPLANT
KIT BASIN OR (CUSTOM PROCEDURE TRAY) ×3 IMPLANT
KIT SUCTION CATH 14FR (SUCTIONS) ×5 IMPLANT
KIT TURNOVER KIT B (KITS) ×3 IMPLANT
KIT VASOVIEW HEMOPRO 2 VH 4000 (KITS) ×3 IMPLANT
LEAD PACING MYOCARDI (MISCELLANEOUS) ×1 IMPLANT
MARKER GRAFT CORONARY BYPASS (MISCELLANEOUS) ×9 IMPLANT
NS IRRIG 1000ML POUR BTL (IV SOLUTION) ×15 IMPLANT
PACK E OPEN HEART (SUTURE) ×3 IMPLANT
PACK OPEN HEART (CUSTOM PROCEDURE TRAY) ×3 IMPLANT
PAD ARMBOARD 7.5X6 YLW CONV (MISCELLANEOUS) ×2 IMPLANT
PAD ELECT DEFIB RADIOL ZOLL (MISCELLANEOUS) ×3 IMPLANT
PENCIL BUTTON HOLSTER BLD 10FT (ELECTRODE) ×3 IMPLANT
POSITIONER HEAD DONUT 9IN (MISCELLANEOUS) ×3 IMPLANT
PUNCH AORTIC ROTATE 4.0MM (MISCELLANEOUS) ×2 IMPLANT
PUNCH AORTIC ROTATE 4.5MM 8IN (MISCELLANEOUS) IMPLANT
PUNCH AORTIC ROTATE 5MM 8IN (MISCELLANEOUS) IMPLANT
SET CARDIOPLEGIA MPS 5001102 (MISCELLANEOUS) ×2 IMPLANT
SPONGE LAP 18X18 RF (DISPOSABLE) ×4 IMPLANT
SPONGE LAP 4X18 RFD (DISPOSABLE) IMPLANT
SUT BONE WAX W31G (SUTURE) ×3 IMPLANT
SUT ETHIBOND X763 2 0 SH 1 (SUTURE) ×4 IMPLANT
SUT MNCRL AB 3-0 PS2 18 (SUTURE) ×10 IMPLANT
SUT PDS AB 1 CTX 36 (SUTURE) ×6 IMPLANT
SUT PROLENE 2 0 SH DA (SUTURE) IMPLANT
SUT PROLENE 3 0 SH DA (SUTURE) ×1 IMPLANT
SUT PROLENE 3 0 SH1 36 (SUTURE) IMPLANT
SUT PROLENE 4 0 RB 1 (SUTURE)
SUT PROLENE 4 0 SH DA (SUTURE) ×2 IMPLANT
SUT PROLENE 4-0 RB1 .5 CRCL 36 (SUTURE) IMPLANT
SUT PROLENE 5 0 C 1 36 (SUTURE) ×11 IMPLANT
SUT PROLENE 6 0 C 1 30 (SUTURE) IMPLANT
SUT PROLENE 7 0 BV 1 (SUTURE) ×6 IMPLANT
SUT PROLENE 7 0 BV1 MDA (SUTURE) ×2 IMPLANT
SUT PROLENE 8 0 BV175 6 (SUTURE) IMPLANT
SUT PROLENE BLUE 7 0 (SUTURE) ×3 IMPLANT
SUT PROLENE POLY MONO (SUTURE) IMPLANT
SUT STEEL 6MS V (SUTURE) ×6 IMPLANT
SUT VIC AB 2-0 CT1 27 (SUTURE) ×4
SUT VIC AB 2-0 CT1 TAPERPNT 27 (SUTURE) IMPLANT
SYSTEM SAHARA CHEST DRAIN ATS (WOUND CARE) ×3 IMPLANT
TAPE CLOTH SURG 4X10 WHT LF (GAUZE/BANDAGES/DRESSINGS) ×2 IMPLANT
TOWEL GREEN STERILE (TOWEL DISPOSABLE) ×3 IMPLANT
TOWEL GREEN STERILE FF (TOWEL DISPOSABLE) ×3 IMPLANT
TRAY FOLEY SLVR 16FR TEMP STAT (SET/KITS/TRAYS/PACK) ×3 IMPLANT
TUBING LAP HI FLOW INSUFFLATIO (TUBING) ×3 IMPLANT
UNDERPAD 30X30 (UNDERPADS AND DIAPERS) ×3 IMPLANT
WATER STERILE IRR 1000ML POUR (IV SOLUTION) ×6 IMPLANT

## 2019-05-19 NOTE — Anesthesia Procedure Notes (Signed)
Procedure Name: Intubation Date/Time: 05/19/2019 9:02 AM Performed by: Larene Beach, CRNA Pre-anesthesia Checklist: Patient identified, Emergency Drugs available, Suction available and Patient being monitored Patient Re-evaluated:Patient Re-evaluated prior to induction Oxygen Delivery Method: Circle system utilized Preoxygenation: Pre-oxygenation with 100% oxygen Induction Type: IV induction Ventilation: Mask ventilation without difficulty Laryngoscope Size: Mac and 4 Tube type: Oral Tube size: 7.5 mm Number of attempts: 1 Airway Equipment and Method: Stylet and Oral airway Placement Confirmation: ETT inserted through vocal cords under direct vision,  positive ETCO2 and breath sounds checked- equal and bilateral Secured at: 24 cm Tube secured with: Tape Dental Injury: Teeth and Oropharynx as per pre-operative assessment

## 2019-05-19 NOTE — Progress Notes (Signed)
     [address]       [place] [address]             [phone]        Some confusion overnight  Vitals:   05/19/19 0536 05/19/19 0600  BP: 131/67 132/76  Pulse: 68 65  Resp:  (!) 26  Temp:    SpO2:  94%   Alert NAD Sinus EWOB  LM CAD, previously on Pletal OR today for CABG [place]

## 2019-05-19 NOTE — Anesthesia Procedure Notes (Signed)
Arterial Line Insertion Start/End12/16/2020 8:08 AM Performed by: Lavell Luster, CRNA, CRNA  Patient location: Pre-op. Preanesthetic checklist: patient identified, IV checked, site marked, risks and benefits discussed, surgical consent, monitors and equipment checked, pre-op evaluation, timeout performed and anesthesia consent Lidocaine 1% used for infiltration Left, radial was placed Catheter size: 20 Fr Hand hygiene performed  and maximum sterile barriers used   Attempts: 2 Procedure performed without using ultrasound guided technique. Following insertion, dressing applied and Biopatch. Post procedure assessment: normal and unchanged  Patient tolerated the procedure well with no immediate complications.

## 2019-05-19 NOTE — Transfer of Care (Signed)
Immediate Anesthesia Transfer of Care Note  Patient: Joseph Byrd  Procedure(s) Performed: CORONARY ARTERY BYPASS GRAFTING (CABG), on pump, times four using left internal mammary artery and right greater saphenous vein harvested endoscopically (N/A Chest)  Patient Location: PACU  Anesthesia Type:General  Level of Consciousness: sedated and Patient remains intubated per anesthesia plan  Airway & Oxygen Therapy: Patient remains intubated per anesthesia plan and Patient placed on Ventilator (see vital sign flow sheet for setting)  Post-op Assessment: Report given to RN and Post -op Vital signs reviewed and stable  Post vital signs: Reviewed and stable  Last Vitals:  Vitals Value Taken Time  BP    Temp    Pulse 68 05/19/19 1431  Resp 12 05/19/19 1431  SpO2 100 % 05/19/19 1431  Vitals shown include unvalidated device data.  Last Pain:  Vitals:   05/19/19 0400  TempSrc: Oral  PainSc: 0-No pain      Patients Stated Pain Goal: 0 ([id number])  Complications: No apparent anesthesia complications   Patient transported to ICU with standard monitors (HR, BP, SPO2, RR) and emergency drugs/equipment. Controlled ventilation maintained via ambu bag. Report given to bedside RN and respiratory therapist. Pt connected to ICU monitor and ventilator. All questions answered and vital signs stable before leaving

## 2019-05-19 NOTE — Procedures (Signed)
Extubation Procedure Note  Patient Details:   Name: Joseph Byrd DOB: 05-Jan-1934 MRN: [id number]   Airway Documentation:    Vent end date: 05/19/19 Vent end time: 1805   Evaluation  O2 sats: stable throughout Complications: No apparent complications Patient did tolerate procedure well. Bilateral Breath Sounds: Clear, Diminished   No dementia at baseline.   Patient was extubated to a 4L [place] without any complications, dyspnea or stridor noted. Patient has dementia at baseline. RT and RN spoke with Dr. Kipp Brood and verbal orders were given to proceed with extubation without NIF & VC. Positive cuff leak and ABG results were within normal limits,   [name] L 05/19/2019, 6:05 PM

## 2019-05-19 NOTE — Progress Notes (Signed)
SICU vent weaning protocol initiated.

## 2019-05-19 NOTE — Progress Notes (Signed)
DR. Kipp Brood was notified about the patient's [place] 7.322 and Acid-base deficit 1.0. No new orders received at this time.

## 2019-05-19 NOTE — Brief Op Note (Signed)
05/18/2019 - 05/19/2019  12:33 PM  PATIENT:  Joseph Byrd  85 y.o. male  PRE-OPERATIVE DIAGNOSIS:  CORONARY ARTERY DISEASE  POST-OPERATIVE DIAGNOSIS:  CORONARY ARTERY DISEASE  PROCEDURE:   CORONARY ARTERY BYPASS GRAFTING (CABG) x 4 (LIMA to LAD, SVG to DIAGONAL, SVG to OM, SVG to RAMUS INTERMEDIATE) with EVH from [place] and LEFT INTERNAL MAMMARY   SURGEON:  Surgeon(s) and Role:    Lightfoot, Lucile Crater, MD - Primary  PHYSICIAN ASSISTANT: Lars Pinks PA-C  ANESTHESIA:   general  EBL: Per anesthesia and perfusion record   DRAINS: Chest tubes placed in the mediastinal and pleural spaces   COUNTS CORRECT:  YES  DICTATION: .Dragon Dictation  PLAN OF CARE: Admit to inpatient   PATIENT DISPOSITION:  ICU - intubated and hemodynamically stable.   Delay start of Pharmacological VTE agent (>24hrs) due to surgical blood loss or risk of bleeding: yes  BASELINE WEIGHT: 75.8 kg

## 2019-05-19 NOTE — Progress Notes (Signed)
Patient's wife, Sadao Hanner was called this morning and notified of patients forgetfulness and memory impairment. Judson Roch stated that was patients baseline due to patient having Alzheimer's disease. Verbal Consent for surgery and blood product transfusion obtained from patient's wife Aemon Faller at 0600 with two nurses witnessing

## 2019-05-19 NOTE — Anesthesia Procedure Notes (Signed)
Procedure Name: Intubation Date/Time: 05/19/2019 9:40 AM Performed by: Larene Beach, CRNA Pre-anesthesia Checklist: Patient identified, Emergency Drugs available, Suction available and Patient being monitored Patient Re-evaluated:Patient Re-evaluated prior to induction Oxygen Delivery Method: Circle system utilized Preoxygenation: Pre-oxygenation with 100% oxygen Laryngoscope Size: Glidescope and 4 Grade View: Grade I Tube type: Oral Tube size: 7.5 mm Number of attempts: 1 Airway Equipment and Method: Oral airway,  Video-laryngoscopy and Rigid stylet Placement Confirmation: ETT inserted through vocal cords under direct vision,  positive ETCO2 and breath sounds checked- equal and bilateral Secured at: 23 cm Tube secured with: Tape Dental Injury: Teeth and Oropharynx as per pre-operative assessment

## 2019-05-19 NOTE — Anesthesia Procedure Notes (Signed)
Central Venous Catheter Insertion Performed by: Roderic Palau, MD, anesthesiologist Start/End12/16/2020 7:35 AM, 05/19/2019 7:50 AM Patient location: Pre-op. Preanesthetic checklist: patient identified, IV checked, site marked, risks and benefits discussed, surgical consent, monitors and equipment checked, pre-op evaluation, timeout performed and anesthesia consent Hand hygiene performed  and maximum sterile barriers used  PA cath was placed.Swan type:thermodilution Procedure performed without using ultrasound guided technique. Attempts: 1 Patient tolerated the procedure well with no immediate complications.

## 2019-05-19 NOTE — Op Note (Signed)
[address]       [place] [address]             [phone]                                          05/19/2019 Patient:  Joseph Byrd Pre-Op Dx:  Joseph Byrd CAD   unstable angina   Dementia   Post-op Dx:  same Procedure: CABG X 4.  LIMA-LAD, RSVG RI, OM1, D1   Endoscopic greater saphenous vein harvest on the right Intra-operative Transesophageal Echocardiogram  Surgeon and Role:      * Joseph Byrd, Joseph Crater, MD - Primary    * D.Zimmerman, PA-C - assisting   Anesthesia  general EBL:  518ml Blood Administration: none Xclamp Time:  76 min Pump Time:  116 min  Drains: 74 F blake drain:  R, L, mediastinal  Wires: none Counts: correct   Indications: 85 yo male admitted following an elective LHC which showed LM stenosis with ulcerated plaque.  He originally presented in November of this year with anginal symptoms.  He was medically optimized, and instructed to return today for his Cath.  During the procedure, he develop some chest pain, but this stabilized.  CTS was consulted to assist with management  Findings: Good LIMA.  varicose vein grafts.  Heavily calcified LAD.  There was a distal stent.  LAD bypass was distal to it.  Intramyocardial OM.  Calcified ramus, and diagonal   Operative Technique: All invasive lines were placed in pre-op holding.  After the risks, benefits and alternatives were thoroughly discussed, the patient was brought to the operative theatre.  Anesthesia was induced, and the patient was prepped and draped in normal sterile fashion.  An appropriate surgical pause was performed, and pre-operative antibiotics were dosed accordingly.  We began with simultaneous incisions were made along the right leg for harvesting of the greater saphenous vein and the chest for the sternotomy.  In regards to the sternotomy, this was carried down with bovie cautery, and the sternum was divided with a reciprocating saw.  Meticulous hemostasis was obtained.   The left internal thoracic artery was exposed and harvested in in pedicled fashion.  The patient was systemically heparinized, and the artery was divided distally, and placed in a papaverine sponge.    The sternal elevator was removed, and a retractor was placed.  The pericardium was divided in the midline and fashioned into a cradle with pericardial stitches.   After we confirmed an appropriate ACT, the ascending aorta was cannulated in standard fashion.  The right atrial appendage was used for venous cannulation site.  Cardiopulmonary bypass was initiated, and the heart retractor was placed. The cross clamp was applied, and a dose of anterograde cardioplegia was given with good arrest of the heart.  We moved to the lateral wall of the heart, and found a good target on the OM1.  An arteriotomy was made, and the vein graft was anastomosed to it in an end to side fashion.  Next we exposed the antero-lateral wall, and found a good target on the ramus intermedius.  An end to side anastomosis with the vein graft was then created.  Next, we exposed the anterior wall of the heart and identified a good target on D1.   An arteriotomy was created.  The vein was anastomosed in an end to  side fashion.  Finally, we exposed a good target on the  LAD, and fashioned an end to side anastomosis between it and the LITA.  We began to re-warm, and a re-animation dose of cardioplegia was given.  The heart was de-aired, and the cross clamp was removed.  Meticulous hemostasis was obtained.    A partial occludding clamp was then placed on the ascending aorta, and we created an end to side anastomosis between it and the proximal vein grafts.  The proximal sites were marked with rings.  Hemostasis was obtained, and we separated from cardiopulmonary bypass without event.the heparin was reversed with protamine.  Chest tubes and wires were placed, and the sternum was re-approximated with with sternal wires.  The soft tissue and skin were  re-approximated wth absorbable suture.    The patient tolerated the procedure without any immediate complications, and was transferred to the ICU in guarded condition.  Joseph Byrd Joseph Byrd

## 2019-05-19 NOTE — Anesthesia Procedure Notes (Signed)
Central Venous Catheter Insertion Performed by: Roderic Palau, MD, anesthesiologist Start/End12/16/2020 7:35 AM, 05/19/2019 7:50 AM Patient location: Pre-op. Preanesthetic checklist: patient identified, IV checked, site marked, risks and benefits discussed, surgical consent, monitors and equipment checked, pre-op evaluation, timeout performed and anesthesia consent Position: Trendelenburg Lidocaine 1% used for infiltration and patient sedated Hand hygiene performed , maximum sterile barriers used  and Seldinger technique used Catheter size: 9 Fr Total catheter length 10. Central line was placed.MAC introducer Procedure performed using ultrasound guided technique. Ultrasound Notes:anatomy identified, needle tip was noted to be adjacent to the nerve/plexus identified, no ultrasound evidence of intravascular and/or intraneural injection and image(s) printed for medical record Attempts: 1 Following insertion, line sutured, dressing applied and Biopatch. Post procedure assessment: blood return through all ports, free fluid flow and no air  Patient tolerated the procedure well with no immediate complications.

## 2019-05-19 NOTE — Progress Notes (Signed)
  Echocardiogram Echocardiogram Transesophageal has been performed.  [name] M 05/19/2019, 9:04 AM

## 2019-05-20 ENCOUNTER — Encounter: Payer: Self-pay | Admitting: *Deleted

## 2019-05-20 ENCOUNTER — Inpatient Hospital Stay (HOSPITAL_COMMUNITY): Payer: Medicare HMO

## 2019-05-20 LAB — POCT I-STAT, CHEM 8
BUN: 15 mg/dL (ref 8–23)
BUN: 12 mg/dL (ref 8–23)
BUN: 14 mg/dL (ref 8–23)
BUN: 14 mg/dL (ref 8–23)
BUN: 14 mg/dL (ref 8–23)
Calcium, Ion: 1.22 mmol/L (ref 1.15–1.40)
Calcium, Ion: 0.94 mmol/L — ABNORMAL LOW (ref 1.15–1.40)
Calcium, Ion: 1.21 mmol/L (ref 1.15–1.40)
Calcium, Ion: 1.14 mmol/L — ABNORMAL LOW (ref 1.15–1.40)
Calcium, Ion: 1.37 mmol/L (ref 1.15–1.40)
Chloride: 105 mmol/L (ref 98–111)
Chloride: 101 mmol/L (ref 98–111)
Chloride: 105 mmol/L (ref 98–111)
Chloride: 105 mmol/L (ref 98–111)
Chloride: 105 mmol/L (ref 98–111)
Creatinine, Ser: 0.9 mg/dL (ref 0.61–1.24)
Creatinine, Ser: 0.6 mg/dL — ABNORMAL LOW (ref 0.61–1.24)
Creatinine, Ser: 0.7 mg/dL (ref 0.61–1.24)
Creatinine, Ser: 0.7 mg/dL (ref 0.61–1.24)
Creatinine, Ser: 0.7 mg/dL (ref 0.61–1.24)
Glucose, Bld: 113 mg/dL — ABNORMAL HIGH (ref 70–99)
Glucose, Bld: 143 mg/dL — ABNORMAL HIGH (ref 70–99)
Glucose, Bld: 156 mg/dL — ABNORMAL HIGH (ref 70–99)
Glucose, Bld: 185 mg/dL — ABNORMAL HIGH (ref 70–99)
Glucose, Bld: 154 mg/dL — ABNORMAL HIGH (ref 70–99)
HCT: 35 % — ABNORMAL LOW (ref 39.0–52.0)
HCT: 26 % — ABNORMAL LOW (ref 39.0–52.0)
HCT: 32 % — ABNORMAL LOW (ref 39.0–52.0)
HCT: 28 % — ABNORMAL LOW (ref 39.0–52.0)
HCT: 26 % — ABNORMAL LOW (ref 39.0–52.0)
Hemoglobin: 11.9 g/dL — ABNORMAL LOW (ref 13.0–17.0)
Hemoglobin: 10.9 g/dL — ABNORMAL LOW (ref 13.0–17.0)
Hemoglobin: 8.8 g/dL — ABNORMAL LOW (ref 13.0–17.0)
Hemoglobin: 9.5 g/dL — ABNORMAL LOW (ref 13.0–17.0)
Hemoglobin: 8.8 g/dL — ABNORMAL LOW (ref 13.0–17.0)
Potassium: 3.8 mmol/L (ref 3.5–5.1)
Potassium: 3.5 mmol/L (ref 3.5–5.1)
Potassium: 3.7 mmol/L (ref 3.5–5.1)
Potassium: 3.7 mmol/L (ref 3.5–5.1)
Potassium: 3.4 mmol/L — ABNORMAL LOW (ref 3.5–5.1)
Sodium: 139 mmol/L (ref 135–145)
Sodium: 140 mmol/L (ref 135–145)
Sodium: 141 mmol/L (ref 135–145)
Sodium: 138 mmol/L (ref 135–145)
Sodium: 140 mmol/L (ref 135–145)
TCO2: 29 mmol/L (ref 22–32)
TCO2: 26 mmol/L (ref 22–32)
TCO2: 28 mmol/L (ref 22–32)
TCO2: 27 mmol/L (ref 22–32)
TCO2: 27 mmol/L (ref 22–32)

## 2019-05-20 LAB — CBC
HCT: 27.8 % — ABNORMAL LOW (ref 39.0–52.0)
HCT: 28.8 % — ABNORMAL LOW (ref 39.0–52.0)
Hemoglobin: 8.9 g/dL — ABNORMAL LOW (ref 13.0–17.0)
Hemoglobin: 8.7 g/dL — ABNORMAL LOW (ref 13.0–17.0)
MCH: 31.6 pg (ref 26.0–34.0)
MCH: 30.2 pg (ref 26.0–34.0)
MCHC: 32 g/dL (ref 30.0–36.0)
MCHC: 30.2 g/dL (ref 30.0–36.0)
MCV: 98.6 fL (ref 80.0–100.0)
MCV: 100 fL (ref 80.0–100.0)
Platelets: 466 10*3/uL — ABNORMAL HIGH (ref 150–400)
Platelets: 561 10*3/uL — ABNORMAL HIGH (ref 150–400)
RBC: 2.82 MIL/uL — ABNORMAL LOW (ref 4.22–5.81)
RBC: 2.88 MIL/uL — ABNORMAL LOW (ref 4.22–5.81)
RDW: 13.2 % (ref 11.5–15.5)
RDW: 13.5 % (ref 11.5–15.5)
WBC: 13 10*3/uL — ABNORMAL HIGH (ref 4.0–10.5)
WBC: 16.3 10*3/uL — ABNORMAL HIGH (ref 4.0–10.5)
nRBC: 0 % (ref 0.0–0.2)
nRBC: 0 % (ref 0.0–0.2)

## 2019-05-20 LAB — GLUCOSE, CAPILLARY
Glucose-Capillary: 101 mg/dL — ABNORMAL HIGH (ref 70–99)
Glucose-Capillary: 115 mg/dL — ABNORMAL HIGH (ref 70–99)
Glucose-Capillary: 119 mg/dL — ABNORMAL HIGH (ref 70–99)
Glucose-Capillary: 119 mg/dL — ABNORMAL HIGH (ref 70–99)
Glucose-Capillary: 120 mg/dL — ABNORMAL HIGH (ref 70–99)
Glucose-Capillary: 123 mg/dL — ABNORMAL HIGH (ref 70–99)
Glucose-Capillary: 124 mg/dL — ABNORMAL HIGH (ref 70–99)
Glucose-Capillary: 128 mg/dL — ABNORMAL HIGH (ref 70–99)
Glucose-Capillary: 131 mg/dL — ABNORMAL HIGH (ref 70–99)
Glucose-Capillary: 131 mg/dL — ABNORMAL HIGH (ref 70–99)
Glucose-Capillary: 143 mg/dL — ABNORMAL HIGH (ref 70–99)
Glucose-Capillary: 175 mg/dL — ABNORMAL HIGH (ref 70–99)

## 2019-05-20 LAB — BASIC METABOLIC PANEL
Anion gap: 10 (ref 5–15)
Anion gap: 8 (ref 5–15)
BUN: 17 mg/dL (ref 8–23)
BUN: 18 mg/dL (ref 8–23)
CO2: 24 mmol/L (ref 22–32)
CO2: 23 mmol/L (ref 22–32)
Calcium: 8.5 mg/dL — ABNORMAL LOW (ref 8.9–10.3)
Calcium: 8.6 mg/dL — ABNORMAL LOW (ref 8.9–10.3)
Chloride: 106 mmol/L (ref 98–111)
Chloride: 107 mmol/L (ref 98–111)
Creatinine, Ser: 1.1 mg/dL (ref 0.61–1.24)
Creatinine, Ser: 1.36 mg/dL — ABNORMAL HIGH (ref 0.61–1.24)
GFR calc Af Amer: >60 mL/min (ref >60)
GFR calc Af Amer: 56 mL/min — ABNORMAL LOW (ref >60)
GFR calc non Af Amer: >60 mL/min (ref >60)
GFR calc non Af Amer: 48 mL/min — ABNORMAL LOW (ref >60)
Glucose, Bld: 148 mg/dL — ABNORMAL HIGH (ref 70–99)
Glucose, Bld: 119 mg/dL — ABNORMAL HIGH (ref 70–99)
Potassium: 4.5 mmol/L (ref 3.5–5.1)
Potassium: 4 mmol/L (ref 3.5–5.1)
Sodium: 140 mmol/L (ref 135–145)
Sodium: 138 mmol/L (ref 135–145)

## 2019-05-20 LAB — POCT I-STAT 7, (LYTES, BLD GAS, ICA,H+H)
Acid-Base Excess: 1 mmol/L (ref 0.0–2.0)
Acid-Base Excess: 3 mmol/L — ABNORMAL HIGH (ref 0.0–2.0)
Bicarbonate: 27.3 mmol/L (ref 20.0–28.0)
Bicarbonate: 25.9 mmol/L (ref 20.0–28.0)
Bicarbonate: 26.2 mmol/L (ref 20.0–28.0)
Calcium, Ion: 1.22 mmol/L (ref 1.15–1.40)
Calcium, Ion: 1.03 mmol/L — ABNORMAL LOW (ref 1.15–1.40)
Calcium, Ion: 1.35 mmol/L (ref 1.15–1.40)
HCT: 36 % — ABNORMAL LOW (ref 39.0–52.0)
HCT: 28 % — ABNORMAL LOW (ref 39.0–52.0)
HCT: 27 % — ABNORMAL LOW (ref 39.0–52.0)
Hemoglobin: 12.2 g/dL — ABNORMAL LOW (ref 13.0–17.0)
Hemoglobin: 9.5 g/dL — ABNORMAL LOW (ref 13.0–17.0)
Hemoglobin: 9.2 g/dL — ABNORMAL LOW (ref 13.0–17.0)
O2 Saturation: 100 %
O2 Saturation: 100 %
O2 Saturation: 100 %
Potassium: 3.7 mmol/L (ref 3.5–5.1)
Potassium: 3.7 mmol/L (ref 3.5–5.1)
Potassium: 3.4 mmol/L — ABNORMAL LOW (ref 3.5–5.1)
Sodium: 140 mmol/L (ref 135–145)
Sodium: 140 mmol/L (ref 135–145)
Sodium: 140 mmol/L (ref 135–145)
TCO2: 29 mmol/L (ref 22–32)
TCO2: 27 mmol/L (ref 22–32)
TCO2: 28 mmol/L (ref 22–32)
pCO2 arterial: 47.7 mmHg (ref 32.0–48.0)
pCO2 arterial: 33.1 mmHg (ref 32.0–48.0)
pCO2 arterial: 52.3 mmHg — ABNORMAL HIGH (ref 32.0–48.0)
pH, Arterial: 7.366 (ref 7.350–7.450)
pH, Arterial: 7.501 — ABNORMAL HIGH (ref 7.350–7.450)
pH, Arterial: 7.308 — ABNORMAL LOW (ref 7.350–7.450)
pO2, Arterial: 492 mmHg — ABNORMAL HIGH (ref 83.0–108.0)
pO2, Arterial: 325 mmHg — ABNORMAL HIGH (ref 83.0–108.0)
pO2, Arterial: 271 mmHg — ABNORMAL HIGH (ref 83.0–108.0)

## 2019-05-20 LAB — MAGNESIUM
Magnesium: 2.7 mg/dL — ABNORMAL HIGH (ref 1.7–2.4)
Magnesium: 2.8 mg/dL — ABNORMAL HIGH (ref 1.7–2.4)

## 2019-05-20 MED ORDER — ENOXAPARIN SODIUM 30 MG/0.3ML ~~LOC~~ SOLN
30.0000 mg | Freq: Every day | SUBCUTANEOUS | Status: DC
  Administered 2019-05-20 – 2019-05-25 (×6): 30 mg via SUBCUTANEOUS
  Filled 2019-05-20 (×6): qty 0.3

## 2019-05-20 MED ORDER — OLANZAPINE 5 MG PO TBDP
5.0000 mg | ORAL_TABLET | Freq: Every day | ORAL | Status: DC
  Administered 2019-05-21 – 2019-05-25 (×5): 5 mg via ORAL
  Filled 2019-05-20 (×8): qty 1

## 2019-05-20 MED ORDER — INSULIN ASPART 100 UNIT/ML ~~LOC~~ SOLN
0.0000 [IU] | SUBCUTANEOUS | Status: DC
  Administered 2019-05-21 – 2019-05-22 (×4): 2 [IU] via SUBCUTANEOUS

## 2019-05-20 MED FILL — Lidocaine HCl Local Preservative Free (PF) Inj 2%: INTRAMUSCULAR | Qty: 15 | Status: AC

## 2019-05-20 MED FILL — Mannitol IV Soln 20%: INTRAVENOUS | Qty: 500 | Status: AC

## 2019-05-20 MED FILL — Potassium Chloride Inj 2 mEq/ML: INTRAVENOUS | Qty: 40 | Status: AC

## 2019-05-20 MED FILL — Sodium Bicarbonate IV Soln 8.4%: INTRAVENOUS | Qty: 50 | Status: AC

## 2019-05-20 MED FILL — Sodium Chloride IV Soln 0.9%: INTRAVENOUS | Qty: 3000 | Status: AC

## 2019-05-20 MED FILL — Vancomycin HCl For IV Soln 1 GM (Base Equivalent): INTRAVENOUS | Qty: 1000 | Status: AC

## 2019-05-20 MED FILL — Calcium Chloride Inj 10%: INTRAVENOUS | Qty: 10 | Status: AC

## 2019-05-20 MED FILL — Heparin Sodium (Porcine) Inj 1000 Unit/ML: INTRAMUSCULAR | Qty: 30 | Status: AC

## 2019-05-20 MED FILL — Electrolyte-R (PH 7.4) Solution: INTRAVENOUS | Qty: 4000 | Status: AC

## 2019-05-20 MED FILL — Heparin Sodium (Porcine) Inj 1000 Unit/ML: INTRAMUSCULAR | Qty: 10 | Status: AC

## 2019-05-20 NOTE — Progress Notes (Signed)
      [address]       [place] [address]             [phone]                 1 Day Post-Op Procedure(s) (LRB): CORONARY ARTERY BYPASS GRAFTING (CABG), on pump, times four using left internal mammary artery and right greater saphenous vein harvested endoscopically (N/A)   Events: No events extubated last night _______________________________________________________________ Vitals: BP 90/60   Pulse 87   Temp 99.5 F (37.5 C)   Resp (!) 27   Ht 5\' 9"  (1.753 m)   Wt 78.9 kg   SpO2 100%   BMI 25.69 kg/m   - Neuro: baseline dementia  - Cardiovascular: sinus  Drips: dobutamine 2.5.   PAP: (19-32)/(4-15) 19/9 CO:  [5.4 L/min-7.5 L/min] 7.4 L/min CI:  [2.9 L/min/m2-4 L/min/m2] 4 L/min/m2  - Pulm: EWOB Vent Mode: PSV;CPAP FiO2 (%):  [40 %-50 %] 40 % Set Rate:  [4 bmp-12 bmp] 4 bmp Vt Set:  [560 mL] 560 mL PEEP:  [5 cmH20] 5 cmH20 Pressure Support:  [10 cmH20] 10 cmH20 Plateau Pressure:  [11 cmH20] 11 cmH20  ABG    Component Value Date/Time   PHART 7.383 05/19/2019 1906   PCO2ART 39.8 05/19/2019 1906   PO2ART 91.0 05/19/2019 1906   HCO3 24.0 05/19/2019 1906   TCO2 25 05/19/2019 1906   ACIDBASEDEF 1.0 05/19/2019 1906   O2SAT 97.0 05/19/2019 1906    - Abd: soft NT - Extremity: trace  .Intake/Output      12/16 0701 - 12/17 0700 12/17 0701 - 12/18 0700   P.O.     I.V. (mL/kg) 2774.3 (35.2)    Blood 300    IV Piggyback 1750.9    Total Intake(mL/kg) 4825.2 (61.2)    Urine (mL/kg/hr) 1470 (0.8)    Emesis/NG output 0    Blood 600    Chest Tube 568    Total Output 2638    Net +2187.2         Emesis Occurrence 1 x       _______________________________________________________________ Labs: CBC Latest Ref Rng & Units 05/20/2019 05/19/2019 05/19/2019  WBC 4.0 - 10.5 K/uL 13.0(H) 14.6(H) -  Hemoglobin 13.0 - 17.0 g/dL 8.9(L) 9.0(L) 9.2(L)  Hematocrit 39.0 - 52.0 % 27.8(L) 28.4(L) 27.0(L)  Platelets 150 - 400 K/uL 466(H) 458(H) -    CMP Latest Ref Rng & Units 05/20/2019 05/19/2019 05/19/2019  Glucose 70 - 99 mg/dL 148(H) 215(H) -  BUN 8 - 23 mg/dL 17 14 -  Creatinine 0.61 - 1.24 mg/dL 1.10 0.81 -  Sodium 135 - 145 mmol/L 140 139 147(H)  Potassium 3.5 - 5.1 mmol/L 4.5 4.1 4.4  Chloride 98 - 111 mmol/L 106 109 -  CO2 22 - 32 mmol/L 24 22 -  Calcium 8.9 - 10.3 mg/dL 8.5(L) 8.4(L) -  Total Protein 6.5 - 8.1 g/dL - - -  Total Bilirubin 0.3 - 1.2 mg/dL - - -  Alkaline Phos 38 - 126 U/L - - -  AST 15 - 41 U/L - - -  ALT 0 - 44 U/L - - -    CXR: clear  _______________________________________________________________  Assessment and Plan: POD 1 s/p CABG 4  Neuro: baseline dementia.  Pain controlled CV: will d/c dobutamine Pulm: pulm toilet Renal: creat stable.   GI: advancing diet Heme: stable ID: [email or web address] Endo: SSI Dispo: ICU care  Melodie Bouillon, MD 05/20/2019 7:52 AM

## 2019-05-20 NOTE — Discharge Summary (Addendum)
Physician Discharge Summary       [address]       [place] [address]             [phone]    Patient ID: Joseph Byrd MRN: [id number] DOB/AGE: 09/17/1933 85 y.o.  Admit date: 05/18/2019 Discharge date: 05/26/2019  Admission Diagnoses: 1. Angina pectoris associated with type 2 diabetes mellitus ([place]) 2. Coronary artery disease  Discharge Diagnoses:  1.  S/P CABG x 4 2. 3. History of Hypertension 4. History of Hyperlipidemia 5. History of LBBB (left bundle branch block) 6. History of PAD (peripheral artery disease) ([place]) 7. History of tobacco abuse 8. History of arthritis 9. History of dementia     Procedure (s):  CABG X 4.  LIMA-LAD, RSVG RI, OM1, D1   Endoscopic greater saphenous vein harvest on the right Intra-operative Transesophageal Echocardiogram by Dr. Kipp Byrd on 05/19/2019.  History of Presenting Illness: This is an 85 yo male admitted following an elective LHC which showed LM stenosis with ulcerated plaque.  He originally presented in November of this year with anginal symptoms. He was medically optimized, and instructed to return today for his Cath.  During the procedure, he develop some chest pain, but this stabilized.    In regards to his previous symptoms, he has had some unstable angina over the last several weeks, and exertional angina over the last year.  He denies any lower extremity edema, or orthopnea.  He denies any neurologic symptoms.    Dr. Kipp Byrd discussed the need for coronary artery bypass grafting surgery. Potential risks, benefits, and complications of the surgery were discussed with the patient and he agreed to proceed with surgery. Pre operative carotid duplex US showed no significant internal carotid artery stenosis bilaterally. Patient underwent a CABG x 4 on 05/19/2019.  Brief Hospital Course:  The patient was extubated the evening of surgery without difficulty. He remained afebrile and hemodynamically  stable. Joseph Byrd, a line, chest tubes, and foley were removed early in the post operative course. Lopressor was started and titrated accordingly. He had confusion, deliruium post op. Of note, he has a history of Alzheimer's and is on Donepezil as taken prior to admission. He was put on Zyprexa at night, given Haldol, had a sitter, and put in soft restraints PRN for safety. Haldol was later discontinued and he was given Ativan PRN. The aforementioned symptoms were worse at night. He was volume over loaded and diuresed. He had ABL anemia. He did not require a post op transfusion. Last H and H was 8.1. He was put on Trinsicon for anemia. He was weaned off the insulin drip.   The patient's glucose remained well controlled.The patient's HGA1C pre op was 4.9. The patient was felt surgically stable for transfer from the ICU to [facility] for further convalescence on 12/19. He continues to progress with cardiac rehab. He was ambulating on room air. He developed urinary retention and the foley was re inserted on 12/20. He was started on Flomax. Voiding trial was done on 12/22. He has been tolerating a diet and has had a bowel movement.  He is urinating without difficulty.  His incisions were healing without difficulty. The patient is felt surgically stable for discharge today.   Latest Vital Signs: Blood pressure 108/69, pulse 85, temperature 98.9 F (37.2 C), temperature source Oral, resp. rate 16, height 5\' 9"  (1.753 m), weight 77.2 kg, SpO2 98 %.  Physical Exam:  1. CV- NSR, BP controlled- continue Lopressor 2. Pulm-  no acute issues, continue IS 3. Renal creatinine has been stable, weight is stable, no lasix at this time 4. Neuro- dementia stable, continue home medications 5. Dispo- patient stable, will dc home today  Discharge Condition: Stable and discharged to home  Recent laboratory studies:  Lab Results  Component Value Date   WBC 9.4 05/23/2019   HGB 8.1 (L) 05/23/2019   HCT 26.4 (L) 05/23/2019    MCV 100.4 (H) 05/23/2019   PLT 544 (H) 05/23/2019   Lab Results  Component Value Date   NA 143 05/24/2019   K 3.8 05/24/2019   CL 112 (H) 05/24/2019   CO2 22 05/24/2019   CREATININE 1.22 05/24/2019   GLUCOSE 94 05/24/2019      Diagnostic Studies: DG Chest 2 View  Result Date: 04/27/2019 CLINICAL DATA:  Chest pain EXAM: CHEST - 2 VIEW COMPARISON:  03/29/2018, 03/19/2018 FINDINGS: Hyperinflation. No focal opacity or pleural effusion. There is a stable nodularity in the right mid to lower lung. Stable cardiomediastinal silhouette with aortic atherosclerosis. No pneumothorax. IMPRESSION: No active cardiopulmonary disease. Electronically Signed   By: Donavan Foil M.D.   On: 04/27/2019 15:05   CT CHEST WO CONTRAST  Result Date: 05/19/2019 CLINICAL DATA:  Chest pain/anginal equivalent. Prior revascularization. EXAM: CT CHEST WITHOUT CONTRAST TECHNIQUE: Multidetector CT imaging of the chest was performed following the standard protocol without IV contrast. COMPARISON:  Radiograph yesterday. FINDINGS: Cardiovascular: Mild aortic atherosclerosis. Fusiform aneurysmal dilatation of the ascending aorta, maximal dimension 4.4 cm. No periaortic stranding. Heart is normal in size. Coronary artery calcifications versus stents. Small pericardial effusion anteriorly. Mediastinum/Nodes: No mediastinal adenopathy. Calcified lymph node in the right hilum sequela prior granulomatous disease. Limited assessment for hilar adenopathy given lack of IV contrast. Esophagus mildly patulous without wall thickening. No suspicious thyroid nodule. Lungs/Pleura: Dependent linear opacities in both lower lobes consistent with atelectasis. No confluent airspace disease. No pulmonary edema. No pleural fluid. No pulmonary mass. Upper Abdomen: Low-density lesion in the upper left kidney is likely cysts but incompletely characterized. Low-density lesion in the upper right kidney consistent with cysts. No acute findings.  Musculoskeletal: There are no acute or suspicious osseous abnormalities. Mild broad-based scoliotic curvature of spine with multilevel degenerative change. IMPRESSION: 1. Mild dependent atelectasis in the lung bases. 2. Aortic atherosclerosis. Fusiform aneurysmal dilatation of the ascending aorta, maximal dimension 4.4 cm. Recommend annual imaging followup by CTA or MRA. This recommendation follows 2010 ACCF/AHA/AATS/ACR/ASA/SCA/SCAI/SIR/STS/SVM Guidelines for the Diagnosis and Management of Patients with Thoracic Aortic Disease. Circulation. 2010; [id number]. Aortic aneurysm NOS (ICD10-I71.9) 3. Coronary artery calcifications versus stents. Small pericardial effusion anteriorly. Aortic Atherosclerosis (ICD10-I70.0). Electronically Signed   By: Keith Rake M.D.   On: 05/19/2019 04:45   CARDIAC CATHETERIZATION  Result Date: 05/18/2019  Ost LM to Dist LM lesion is 50% stenosed.  Ramus lesion is 80% stenosed.  Dist LM to Ost LAD lesion is 90% stenosed.  1st Mrg lesion is 60% stenosed.  1st Diag lesion is 50% stenosed.  LV end diastolic pressure is normal.  Previously placed Mid Cx to Dist Cx stent (unknown type) is widely patent.  Previously placed Mid LAD stent (unknown type) is widely patent.  Left Heart Catheterization 05/18/19: Normal LVEDP.  LV gram although performed is nondiagnostic due to frequent PVCs. Left main: Ulcerated stenosis of 20 to 30% initially, progressed to at least 50 to 60% at the end of diagnostic angiography with dissection plane clearly evident. LAD: Ostial LAD with a 80 to 90% stenosis, calcific.  Diffuse disease noted in the mid to distal LAD.  Previously placed stent in the mid LAD widely patent. Circumflex: Mid circumflex stent widely patent.  Dominant vessel.  Mild diffuse disease.  OM1 is fairly large, ostial 90% stenosis. RI: Large vessel, with multiple branches.  Again calcified.  Ostium has a 90% stenosis. Recommendation: Patient has unstable plaque in the left  main.  This was exacerbated by angiography however remained stable without chest pain, no EKG abnormality, normal hemodynamics.  We will admit the patient to the hospital in view of high risk for ACS and potential for sudden cardiac death.  He will be evaluated for urgent CABG. He is presently on aspirin and also moderate dose of cilostazol 50 mg twice daily.  This will certainly cause bleeding risk.   DG Pelvis Portable  Result Date: 05/21/2019 CLINICAL DATA:  Concern for foreign body. EXAM: PORTABLE PELVIS 1-2 VIEWS COMPARISON:  None. FINDINGS: There is no radiopaque foreign body projecting over the patient's pelvis. There are several lines and tubes projecting over the pelvic bones that are favored to be external to the patient. There appears to be body wall edema and skin thickening overlying the bilateral hips. Mild bilateral hip osteoarthritis is noted. There is an irregular appearance of the right superior pubic ramus which may represent an age-indeterminate fracture. Vascular calcifications are noted. There are few surgical clips projected over the medial right thigh. IMPRESSION: 1. No radiopaque foreign body projecting over the patient's pelvis. 2. Irregular appearance of the right superior pubic ramus which may represent an age-indeterminate fracture or artifact. Electronically Signed   By: Constance Holster M.D.   On: 05/21/2019 00:50   DG CHEST PORT 1 VIEW  Result Date: 05/23/2019 CLINICAL DATA:  Status post CABG EXAM: PORTABLE CHEST 1 VIEW COMPARISON:  05/22/2019 FINDINGS: There is no obvious pneumothorax. The heart size remains enlarged. The patient is status post prior median sternotomy. There are small bilateral pleural effusions. Atelectasis is noted at the lung bases. There is no acute osseous abnormality. IMPRESSION: 1. Persistent small bilateral pleural effusions with bibasilar atelectasis. 2. No large pneumothorax. 3. Stable cardiac silhouette. Electronically Signed   By: Constance Holster M.D.   On: 05/23/2019 19:34   DG CHEST PORT 1 VIEW  Result Date: 05/22/2019 CLINICAL DATA:  Pneumothorax. EXAM: PORTABLE CHEST 1 VIEW COMPARISON:  Chest x-ray from yesterday. FINDINGS: Interval removal of the right internal jugular sheath, bilateral chest tubes, and mediastinal drain. Stable cardiomediastinal silhouette status post CABG. Normal pulmonary vascularity. Unchanged trace biapical pneumothoraces. Unchanged small bilateral pleural effusions and bibasilar atelectasis. No acute osseous abnormality. IMPRESSION: 1. Interval removal of the bilateral chest tubes and mediastinal drain. Unchanged trace biapical pneumothoraces. 2. Unchanged small bilateral pleural effusions and bibasilar atelectasis. Electronically Signed   By: Titus Dubin M.D.   On: 05/22/2019 08:38   DG Chest Port 1 View  Result Date: 05/21/2019 CLINICAL DATA:  Pleural effusion. CABG 2 days ago. EXAM: PORTABLE CHEST 1 VIEW COMPARISON:  Chest x-ray 05/20/2019 FINDINGS: Patient is status post median sternotomy for CABG. The Swan-Ganz catheter was removed. Right IJ sheath remains. Mediastinal drain and bilateral chest tubes are in place. Small pleural effusions remain, left greater than right. Bibasilar airspace disease likely reflects atelectasis. Visualized soft tissues and bony thorax are. IMPRESSION: 1. Status post median sternotomy for CABG. 2. Small bilateral pleural effusions, left greater than right. 3. Bibasilar airspace disease likely reflects atelectasis. 4. Lung volumes and bibasilar airspace opacities are stable. Electronically Signed  By: San Morelle M.D.   On: 05/21/2019 06:40   DG Chest Port 1 View  Result Date: 05/20/2019 CLINICAL DATA:  Post CABG. EXAM: PORTABLE CHEST 1 VIEW COMPARISON:  05/19/2019 FINDINGS: Endotracheal tube has been removed. Nasogastric tube has been removed. Right jugular central line with a Swan-Ganz catheter. Pulmonary artery catheter is probably in the distal right pulmonary  artery region. Medial aspect of the left hemidiaphragm is obscured and suggestive for atelectasis. Evidence for bilateral chest tubes. Negative for a pneumothorax. Slightly decreased lung volumes. Heart size is stable with post CABG changes. IMPRESSION: 1. Slightly decreased lung volumes. Densities at the medial left lung base are most compatible with atelectasis / consolidation. 2. Support apparatuses appear to be appropriately positioned. Negative for pneumothorax. Electronically Signed   By: Markus Daft M.D.   On: 05/20/2019 08:13   DG Chest Port 1 View  Result Date: 05/19/2019 CLINICAL DATA:  Status post CABG. EXAM: PORTABLE CHEST 1 VIEW COMPARISON:  05/18/2019 FINDINGS: Endotracheal tube in good position approximately 5 cm above the carina. Swan-Ganz catheter tip in the main pulmonary artery. NG tube tip at the gastroesophageal junction. Several tubes overlie the lower chest on the right and left. No pneumothorax. Slight atelectasis at the left base medially. No effusions. No pulmonary edema. IMPRESSION: 1. Satisfactory postoperative appearance of the chest. No pneumothorax. 2. Slight atelectasis at the left base medially. Electronically Signed   By: Lorriane Shire M.D.   On: 05/19/2019 17:11   DG CHEST PORT 1 VIEW  Result Date: 05/18/2019 CLINICAL DATA:  Preop EXAM: PORTABLE CHEST 1 VIEW COMPARISON:  04/27/2019 FINDINGS: Heart is normal size. Minimal left base atelectasis. Right lung clear. No effusions or pneumothorax. No acute bony abnormality. IMPRESSION: Minimal left base atelectasis. Electronically Signed   By: Rolm Baptise M.D.   On: 05/18/2019 19:24   ECHO INTRAOPERATIVE TEE  Result Date: 05/21/2019  *INTRAOPERATIVE TRANSESOPHAGEAL REPORT *  Patient Name:   Joseph Byrd Date of Exam: 05/19/2019 Medical Rec #:  [id number]         Height:       69.0 in Accession #:    [id number]        Weight:       167.0 lb Date of Birth:  [birth year]        BSA:          1.91 m Patient Age:    89 years           BP:           132/76 mmHg Patient Gender: M                 HR:           72 bpm. Exam Location:  Anesthesiology Transesophogeal exam was perform intraoperatively during surgical procedure. Patient was closely monitored under general anesthesia during the entirety of examination. Indications:     Coronary artery disease Performing Phys: [id number] Lucile Crater LIGHTFOOT Diagnosing Phys: Hoy Morn MD Complications: No known complications during this procedure. POST-OP IMPRESSIONS - Left Ventricle: The left ventricle is unchanged from pre-bypass. - Right Ventricle: The right ventricle appears unchanged from pre-bypass. - Aorta: The aorta appears unchanged from pre-bypass. - Left Atrium: The left atrium appears unchanged from pre-bypass. - Left Atrial Appendage: The left atrial appendage appears unchanged from pre-bypass. - Aortic Valve: The aortic valve appears unchanged from pre-bypass. - Mitral Valve: The mitral valve appears unchanged from pre-bypass. - Tricuspid Valve: The tricuspid valve appears unchanged  from pre-bypass. - Interatrial Septum: The interatrial septum appears unchanged from pre-bypass. - Interventricular Septum: The interventricular septum appears unchanged from pre-bypass. - Pericardium: The pericardium appears unchanged from pre-bypass. PRE-OP FINDINGS  Left Ventricle: The left ventricle has normal systolic function, with an ejection fraction of 55-60%. The cavity size was normal. There is mildly increased left ventricular wall thickness. No evidence of left ventricular regional wall motion abnormalities. Right Ventricle: The right ventricle has normal systolic function. The cavity was normal. There is no increase in right ventricular wall thickness. Left Atrium: Left atrial size was dilated. The left atrial appendage is well visualized and there is no evidence of thrombus present. Right Atrium: Right atrial size was normal in size. Right atrial pressure is estimated at 10 mmHg. Interatrial  Septum: No atrial level shunt detected by color flow Doppler. Pericardium: There is no evidence of pericardial effusion. Mitral Valve: The mitral valve is normal in structure. Mitral valve regurgitation is trivial by color flow Doppler. Tricuspid Valve: The tricuspid valve was normal in structure. Tricuspid valve regurgitation is trivial by color flow Doppler. Aortic Valve: The aortic valve is normal in structure. There is moderate calcification of the aortic valve Aortic valve regurgitation was not visualized by color flow Doppler. There is no evidence of aortic valve stenosis. Pulmonic Valve: The pulmonic valve was normal in structure. Pulmonic valve regurgitation is trivial by color flow Doppler. Aorta: The aortic root, ascending aorta and aortic arch are normal in size and structure. There is evidence of plaque in the descending aorta; Grade I, measuring 1-12mm in size. +--------------+-------++ LEFT VENTRICLE        +--------------+-------++ PLAX 2D               +--------------+-------++ LVIDd:        4.70 cm +--------------+-------++ LVIDs:        3.40 cm +--------------+-------++ LV SV:        55 ml   +--------------+-------++ LV SV Index:  28.49   +--------------+-------++                       +--------------+-------++  Hoy Morn MD Electronically signed by Hoy Morn MD Signature Date/Time: 05/21/2019/12:22:11 PM    Final    Doppler Pre CABG  Result Date: 05/18/2019 PREOPERATIVE VASCULAR EVALUATION  Performing Technologist: Lita Mains RDMS, RVT  Examination Guidelines: A complete evaluation includes B-mode imaging, spectral Doppler, color Doppler, and power Doppler as needed of all accessible portions of each vessel. Bilateral testing is considered an integral part of a complete examination. Limited examinations for reoccurring indications may be performed as noted.  Right Carotid Findings: +----------+--------+--------+--------+--------+--------+           PSV  cm/sEDV cm/sStenosisDescribeComments +----------+--------+--------+--------+--------+--------+ CCA Prox  80      12                               +----------+--------+--------+--------+--------+--------+ CCA Mid                           calcific         +----------+--------+--------+--------+--------+--------+ CCA Distal77      20                               +----------+--------+--------+--------+--------+--------+ ICA Prox  68      22                               +----------+--------+--------+--------+--------+--------+  ICA Distal55      26                               +----------+--------+--------+--------+--------+--------+ ECA       85      14                               +----------+--------+--------+--------+--------+--------+ Portions of this table do not appear on this page. +----------+--------+-------+--------+------------+           PSV cm/sEDV cmsDescribeArm Pressure +----------+--------+-------+--------+------------+ Subclavian74                                  +----------+--------+-------+--------+------------+ +---------+--------+--+--------+--+---------+ VertebralPSV cm/s35EDV cm/s12Antegrade +---------+--------+--+--------+--+---------+ difficult evaluation of bifrucation due to tissue properties and shadowing. Left Carotid Findings: +----------+-------+--------+--------+--------------------------------+--------+           PSV    EDV cm/sStenosisDescribe                        Comments           cm/s                                                            +----------+-------+--------+--------+--------------------------------+--------+ CCA Prox  81     20              diffuse, heterogenous and                                                 calcific                                 +----------+-------+--------+--------+--------------------------------+--------+ CCA Mid                           diffuse and heterogenous                 +----------+-------+--------+--------+--------------------------------+--------+ CCA Distal78     11                                                       +----------+-------+--------+--------+--------------------------------+--------+ ICA Prox  92     26              calcific                                 +----------+-------+--------+--------+--------------------------------+--------+ ICA Mid   51     17                                                       +----------+-------+--------+--------+--------------------------------+--------+  ICA Distal60     21                                                       +----------+-------+--------+--------+--------------------------------+--------+ ECA       77     14                                                       +----------+-------+--------+--------+--------------------------------+--------+ +----------+--------+--------+--------+------------+ SubclavianPSV cm/sEDV cm/sDescribeArm Pressure +----------+--------+--------+--------+------------+           86                      149          +----------+--------+--------+--------+------------+ +---------+--------+--+--------+--+---------+ VertebralPSV cm/s68EDV cm/s22Antegrade +---------+--------+--+--------+--+---------+  ABI Findings: +--------+------------------+-----+---------+--------+ Right   Rt Pressure (mmHg)IndexWaveform Comment  +--------+------------------+-----+---------+--------+ Brachial                       triphasic         +--------+------------------+-----+---------+--------+ PTA     157               1.05 biphasic          +--------+------------------+-----+---------+--------+ DP      106               0.71 biphasic          +--------+------------------+-----+---------+--------+ +--------+------------------+-----+---------+-------+ Left    Lt Pressure (mmHg)IndexWaveform  Comment +--------+------------------+-----+---------+-------+ [id number]                    triphasic        +--------+------------------+-----+---------+-------+ PTA     180               1.21 biphasic         +--------+------------------+-----+---------+-------+ DP      172               1.15 biphasic         +--------+------------------+-----+---------+-------+  Right Doppler Findings: +--------+--------+-----+---------+--------+ Site    PressureIndexDoppler  Comments +--------+--------+-----+---------+--------+ Brachial             triphasic         +--------+--------+-----+---------+--------+ Radial               triphasic         +--------+--------+-----+---------+--------+ Ulnar                triphasic         +--------+--------+-----+---------+--------+  Left Doppler Findings: +--------+--------+-----+---------+--------+ Site    PressureIndexDoppler  Comments +--------+--------+-----+---------+--------+ [id number]          triphasic         +--------+--------+-----+---------+--------+ Radial               triphasic         +--------+--------+-----+---------+--------+ Ulnar                triphasic         +--------+--------+-----+---------+--------+ Technologist Notes: Unable to take pressure of right arm due to recent TR band.  Summary: Right Carotid: Velocities in the right ICA are consistent with a 1-39% stenosis. Left Carotid:  Velocities in the left ICA are consistent with a 1-39% stenosis. Vertebrals: Bilateral vertebral arteries demonstrate antegrade flow. Right ABI: Resting right ankle-brachial index indicates moderate right lower extremity arterial disease. Right PTA indicated normal limits, Right DP indicates moderate PAD. Left ABI: Resting left ankle-brachial index is within normal range. No evidence of significant left lower extremity arterial disease. Right Upper Extremity: Doppler waveforms remain within normal limits with  right radial compression. Doppler waveforms remain within normal limits with right ulnar compression. Left Upper Extremity: Doppler waveforms remain within normal limits with left radial compression. Doppler waveforms remain within normal limits with left ulnar compression.  Electronically signed by Deitra Mayo MD on 05/18/2019 at 7:58:41 PM.    Final    PCV ECHOCARDIOGRAM COMPLETE  Result Date: 05/15/2019 Echocardiogram 05/14/2019: Left ventricle cavity is normal in size. Mild concentric hypertrophy of the left ventricle. Normal LV systolic function with EF 55%. Normal global wall motion. Doppler evidence of grade I (impaired) diastolic dysfunction, normal LAP. Left atrial cavity is mildly dilated. Aneurysmal interatrial septum without 2D or color Doppler evidence of interatrial shunt. Indeterminate number of aortic valve leaflets. Moderate calcification. Trace aortic stenosis. Trace aortic regurgitation. Aortic valve mean gradient of 7 mmHg, Vmax of 2 m/s. Calculated aortic valve area by continuity equation is 2.0  cm. Mild (Grade I) mitral regurgitation. Mild tricuspid regurgitation. Estimated pulmonary artery systolic pressure is 24 mmHg.  Discharge Instructions    Amb Referral to Cardiac Rehabilitation   Complete by: As directed    Diagnosis: CABG   CABG X ___: 4   After initial evaluation and assessments completed: Virtual Based Care may be provided alone or in conjunction with Phase 2 Cardiac Rehab based on patient barriers.: Yes      Discharge Medications: Allergies as of 05/26/2019   No Known Allergies     Medication List    STOP taking these medications   amLODIPine-Valsartan-HCTZ 10-160-25 MG Tabs   cilostazol 100 MG tablet Commonly known as: PLETAL   doxazosin 8 MG tablet Commonly known as: CARDURA   isosorbide mononitrate 30 MG 24 hr tablet Commonly known as: IMDUR     TAKE these medications   acetaminophen 325 MG tablet Commonly known as: TYLENOL Take  650 mg by mouth every 6 (six) hours as needed for moderate pain or headache.   aspirin EC 81 MG tablet Take 81 mg by mouth every morning.   atorvastatin 20 MG tablet Commonly known as: LIPITOR Take 1 tablet (20 mg total) by mouth daily.   donepezil 10 MG tablet Commonly known as: ARICEPT Take 10 mg by mouth at bedtime.   metoprolol succinate 25 MG 24 hr tablet Commonly known as: TOPROL-XL Take 1 tablet (25 mg total) by mouth daily. Take with or immediately following a meal.   multivitamin with minerals Tabs tablet Take 1 tablet by mouth daily.   nitroGLYCERIN 0.4 MG SL tablet Commonly known as: NITROSTAT DISSOLVE 1 TABLET UNDER TONGUE EVERY 5 MIN AS NEEDED FOR CHEST PAIN, MAX 3 DOSES IN 15 MIN What changed: See the new instructions.   OLANZapine zydis 5 MG disintegrating tablet Commonly known as: ZYPREXA Take 1 tablet (5 mg total) by mouth at bedtime.   SYSTANE OP Place 1 drop into both eyes daily.   tamsulosin 0.4 MG Caps capsule Commonly known as: FLOMAX Take 1 capsule (0.4 mg total) by mouth daily.            Durable Medical Equipment  (From admission, onward)  Start     Ordered   05/25/19 1421  For home use only DME 3 n 1  Once     05/25/19 1420   05/25/19 1420  For home use only DME Walker rolling  Once    Question:  Patient needs a walker to treat with the following condition  Answer:  Weakness   05/25/19 1420         The patient has been discharged on:   1.Beta Blocker:  Yes [  x ]                              No   [   ]                              If No, reason:  2.Ace Inhibitor/ARB: Yes [   ]                                     No  [ x   ]                                     If No, reason: labile BP  3.Statin:   Yes [  x ]                  No  [   ]                  If No, reason:  4.Ecasa:  Yes  [  x ]                  No   [   ]                  If No, reason:  Follow Up Appointments: Follow-up Information    Call Adrian Prows, MD.   Specialty: Cardiology Why: To be seen 1 week upon discharge Contact information: [place] [address] [phone]        Lajuana Matte, MD. Go on 06/08/2019.   Specialty: Cardiothoracic Surgery Why: Appointment time is at 9:30 Contact information: [place] [address] [phone]        [facility] Follow up.   Why: 3 n 1 , rolling walker       [facility] Follow up.   Specialty: Rehabilitation Why: they will call you to schedule apt time. if you do not hear from them please contact them thanks. Contact information: [address] [id number] [place] [phone]          Signed:  Ellwood Handler PA-C 05/26/2019, 8:34 AM

## 2019-05-20 NOTE — Discharge Instructions (Signed)

## 2019-05-21 ENCOUNTER — Inpatient Hospital Stay (HOSPITAL_COMMUNITY): Payer: Medicare HMO

## 2019-05-21 LAB — CBC
HCT: 26.5 % — ABNORMAL LOW (ref 39.0–52.0)
Hemoglobin: 8.2 g/dL — ABNORMAL LOW (ref 13.0–17.0)
MCH: 30.8 pg (ref 26.0–34.0)
MCHC: 30.9 g/dL (ref 30.0–36.0)
MCV: 99.6 fL (ref 80.0–100.0)
Platelets: 457 10*3/uL — ABNORMAL HIGH (ref 150–400)
RBC: 2.66 MIL/uL — ABNORMAL LOW (ref 4.22–5.81)
RDW: 13.4 % (ref 11.5–15.5)
WBC: 11.5 10*3/uL — ABNORMAL HIGH (ref 4.0–10.5)
nRBC: 0 % (ref 0.0–0.2)

## 2019-05-21 LAB — BASIC METABOLIC PANEL
Anion gap: 7 (ref 5–15)
BUN: 21 mg/dL (ref 8–23)
CO2: 24 mmol/L (ref 22–32)
Calcium: 8.4 mg/dL — ABNORMAL LOW (ref 8.9–10.3)
Chloride: 108 mmol/L (ref 98–111)
Creatinine, Ser: 1.27 mg/dL — ABNORMAL HIGH (ref 0.61–1.24)
GFR calc Af Amer: >60 mL/min (ref >60)
GFR calc non Af Amer: 52 mL/min — ABNORMAL LOW (ref >60)
Glucose, Bld: 134 mg/dL — ABNORMAL HIGH (ref 70–99)
Potassium: 3.9 mmol/L (ref 3.5–5.1)
Sodium: 139 mmol/L (ref 135–145)

## 2019-05-21 LAB — GLUCOSE, CAPILLARY
Glucose-Capillary: 112 mg/dL — ABNORMAL HIGH (ref 70–99)
Glucose-Capillary: 113 mg/dL — ABNORMAL HIGH (ref 70–99)
Glucose-Capillary: 123 mg/dL — ABNORMAL HIGH (ref 70–99)
Glucose-Capillary: 123 mg/dL — ABNORMAL HIGH (ref 70–99)
Glucose-Capillary: 130 mg/dL — ABNORMAL HIGH (ref 70–99)
Glucose-Capillary: 97 mg/dL (ref 70–99)
Glucose-Capillary: 97 mg/dL (ref 70–99)

## 2019-05-21 LAB — ECHO INTRAOPERATIVE TEE
Height: 69 in
Weight: 2672 oz

## 2019-05-21 MED ORDER — HALOPERIDOL LACTATE 5 MG/ML IJ SOLN
0.5000 mg | Freq: Four times a day (QID) | INTRAMUSCULAR | Status: DC
  Administered 2019-05-21: 0.5 mg via INTRAVENOUS
  Filled 2019-05-21: qty 1

## 2019-05-21 MED ORDER — THIAMINE HCL 100 MG/ML IJ SOLN
Freq: Once | INTRAVENOUS | Status: AC
  Filled 2019-05-21: qty 1000

## 2019-05-21 NOTE — Progress Notes (Signed)
2 Days Post-Op Procedure(s) (LRB): CORONARY ARTERY BYPASS GRAFTING (CABG), on pump, times four using left internal mammary artery and right greater saphenous vein harvested endoscopically (N/A) Subjective: Confused/delirious  Objective: Vital signs in last 24 hours: Temp:  [98.8 F (37.1 C)-99.7 F (37.6 C)] 98.8 F (37.1 C) (12/18 0400) Pulse Rate:  [62-172] 73 (12/18 0400) Cardiac Rhythm: Normal sinus rhythm (12/17 2000) Resp:  [14-31] 21 (12/18 0400) BP: (69-161)/(46-125) 126/81 (12/18 0400) SpO2:  [81 %-100 %] 96 % (12/18 0400) Weight:  [78.9 kg] 78.9 kg (12/17 0600)  Hemodynamic parameters for last 24 hours: PAP: (19-26)/(8-12) 25/11  Intake/Output from previous day: 12/17 0701 - 12/18 0700 In: 629.9 [P.O.:120; I.V.:409.9; IV Piggyback:100] Out: 900 [Urine:770; Chest Tube:130] Intake/Output this shift: Total I/O In: 323.3 [I.V.:323.3] Out: 725 [Urine:625; Chest Tube:100]  General appearance: appears stated age and combative Neurologic: intact Heart: irregularly irregular rhythm Abdomen: soft, non-tender; bowel sounds normal; no masses,  no organomegaly Extremities: extremities normal, atraumatic, no cyanosis or edema Wound: dressed  Lab Results: Recent Labs    05/20/19 0354 05/20/19 1700  WBC 13.0* 16.3*  HGB 8.9* 8.7*  HCT 27.8* 28.8*  PLT 466* 561*   BMET:  Recent Labs    05/20/19 0354 05/20/19 1700  NA 140 138  K 4.5 4.0  CL 106 107  CO2 24 23  GLUCOSE 148* 119*  BUN 17 18  CREATININE 1.10 1.36*  CALCIUM 8.5* 8.6*    PT/INR:  Recent Labs    05/19/19 1501  LABPROT 18.9*  INR 1.6*   ABG    Component Value Date/Time   PHART 7.383 05/19/2019 1906   HCO3 24.0 05/19/2019 1906   TCO2 25 05/19/2019 1906   ACIDBASEDEF 1.0 05/19/2019 1906   O2SAT 97.0 05/19/2019 1906   CBG (last 3)  Recent Labs    05/20/19 2015 05/21/19 0000 05/21/19 0354  GLUCAP 119* 123* 130*    Assessment/Plan: S/P Procedure(s) (LRB): CORONARY ARTERY BYPASS  GRAFTING (CABG), on pump, times four using left internal mammary artery and right greater saphenous vein harvested endoscopically (N/A) delirium protocol  With haldol   LOS: 3 days    Wonda Olds 05/21/2019

## 2019-05-21 NOTE — Progress Notes (Signed)
Inpatient Rehab Admissions:  Inpatient Rehab Consult received.  I await therapy evaluations. Will f/u on Monday.   Signed: Shann Medal, PT, DPT Admissions Coordinator [phone] 05/21/19  4:37 PM

## 2019-05-21 NOTE — Progress Notes (Signed)
Pt BP decreased, LR bolus administered per order. Precedex gtt titrated down. Dr Orvan Seen on unit, informed of pt status- information acknowledged.

## 2019-05-21 NOTE — Progress Notes (Signed)
Summary- Pt care assumed, assessment completed at 2340. Prior to assessment, pt removed the aquacell dressing from his sternal incision, ripped the chest tube dressings off, and removed all monitoring wires. The departing RN replaced the chest  tube dressing and applied betadine to the sternal incision.  Dr Julien Girt informed of pt status, order rec'd for RX intervention- see MAR. Pt also had self DC'ed his foley catheter. The catheter had been torn in two- one end was still attached to the drainage tubing, the other half, tip intact, was lying on the floor. The catheter was temp sensing, and broken wires were noted where the catheter was separated. Charge RN notified, pelvic Xray obtained per protocol to insure no foreign objects remained in the pt. Secondary to pt continued confusion, NT was assigned to sit with pt for safety.

## 2019-05-21 NOTE — Progress Notes (Signed)
I&O cath per protocol for retention of urine.  24cc old dark brown urine out.  Tolerated procedure well.  Small blood clots noted in urine and in catheter upon removal.

## 2019-05-21 NOTE — Progress Notes (Addendum)
[facility] DAILY ICU PROGRESS NOTE                   [address]            [place] [address]          [phone]   2 Days Post-Op Procedure(s) (LRB): CORONARY ARTERY BYPASS GRAFTING (CABG), on pump, times four using left internal mammary artery and right greater saphenous vein harvested endoscopically (N/A)  Total Length of Stay:  LOS: 3 days   Subjective: Patient with confusion and delirium last evening. He tried to pull central line out. Patient in soft restraints (for safety) and sitter in room. He is pleasant this am and unsure of where he is.  Objective: Vital signs in last 24 hours: Temp:  [98.8 F (37.1 C)-99.7 F (37.6 C)] 98.8 F (37.1 C) (12/18 0400) Pulse Rate:  [59-172] 59 (12/18 0700) Cardiac Rhythm: Normal sinus rhythm (12/17 2000) Resp:  [12-31] 12 (12/18 0700) BP: (64-161)/(46-125) 70/49 (12/18 0700) SpO2:  [81 %-100 %] 100 % (12/18 0700)  Filed Weights   05/19/19 0544 05/20/19 0600  Weight: 75.8 kg 78.9 kg    Weight change:    Hemodynamic parameters for last 24 hours: PAP: (19-26)/(10-12) 25/11  Intake/Output from previous day: 12/17 0701 - 12/18 0700 In: 838.4 [P.O.:120; I.V.:618.4; IV Piggyback:100] Out: 900 [Urine:770; Chest Tube:130]  Intake/Output this shift: No intake/output data recorded.  Current Meds: Scheduled Meds: . acetaminophen  1,000 mg Oral Q6H   Or  . acetaminophen (TYLENOL) oral liquid 160 mg/5 mL  1,000 mg Per Tube Q6H  . acetaminophen (TYLENOL) oral liquid 160 mg/5 mL  650 mg Per Tube Once   Or  . acetaminophen  650 mg Rectal Once  . aspirin EC  325 mg Oral Daily   Or  . aspirin  324 mg Per Tube Daily  . atorvastatin  20 mg Oral Daily  . bisacodyl  10 mg Oral Daily   Or  . bisacodyl  10 mg Rectal Daily  . Chlorhexidine Gluconate Cloth  6 each Topical Daily  . docusate sodium  200 mg Oral Daily  . donepezil  10 mg Oral QHS  . doxazosin  8 mg Oral QHS  . enoxaparin (LOVENOX) injection  30 mg  Subcutaneous QHS  . haloperidol lactate  0.5 mg Intravenous Q6H  . insulin aspart  0-24 Units Subcutaneous Q4H  . metoprolol tartrate  12.5 mg Oral BID   Or  . metoprolol tartrate  12.5 mg Per Tube BID  . mupirocin ointment  1 application Nasal BID  . OLANZapine zydis  5 mg Oral QHS  . pantoprazole  40 mg Oral Daily  . polyvinyl alcohol  1 drop Both Eyes Daily  . sodium chloride flush  3 mL Intravenous Q12H   Continuous Infusions: . sodium chloride    . sodium chloride    . sodium chloride    . cefUROXime (ZINACEF)  IV 1.5 g (05/20/19 2205)  . dexmedetomidine (PRECEDEX) IV infusion 0.3 mcg/kg/hr (05/21/19 0349)  . insulin Stopped (05/20/19 1012)  . lactated ringers Stopped (05/19/19 1516)  . lactated ringers 20 mL/hr at 05/20/19 1100  . niCARDipine Stopped (05/19/19 1641)  . nitroGLYCERIN Stopped (05/19/19 1435)  . norepinephrine (LEVOPHED) Adult infusion    . phenylephrine (NEO-SYNEPHRINE) Adult infusion Stopped (05/20/19 2200)  . banana bag IV 1000 mL     PRN Meds:.sodium chloride, dextrose, metoprolol tartrate, midazolam, morphine injection, ondansetron (ZOFRAN) IV, oxyCODONE, sodium chloride flush,  traMADol  General appearance: cooperative and no distress Heart: RRR Lungs: Diminished bibasilar breath sounds Abdomen: Soft, non tender, bowel sounds present Extremities: Mild LE edema Wound: Sternal wound is clean and dry. RLE wounds are clean and dry and there are no signs of infection in either wound  Lab Results: CBC: Recent Labs    05/20/19 1700 05/21/19 0503  WBC 16.3* 11.5*  HGB 8.7* 8.2*  HCT 28.8* 26.5*  PLT 561* 457*   BMET:  Recent Labs    05/20/19 1700 05/21/19 0503  NA 138 139  K 4.0 3.9  CL 107 108  CO2 23 24  GLUCOSE 119* 134*  BUN 18 21  CREATININE 1.36* 1.27*  CALCIUM 8.6* 8.4*    CMET: Lab Results  Component Value Date   WBC 11.5 (H) 05/21/2019   HGB 8.2 (L) 05/21/2019   HCT 26.5 (L) 05/21/2019   PLT 457 (H) 05/21/2019   GLUCOSE 134  (H) 05/21/2019   CHOL 124 05/14/2019   TRIG 41 05/14/2019   HDL 64 05/14/2019   LDLCALC 50 05/14/2019   ALT 15 03/29/2018   AST 26 03/29/2018   NA 139 05/21/2019   K 3.9 05/21/2019   CL 108 05/21/2019   CREATININE 1.27 (H) 05/21/2019   BUN 21 05/21/2019   CO2 24 05/21/2019   TSH 1.530 05/14/2019   INR 1.6 (H) 05/19/2019   HGBA1C 4.9 05/19/2019   MICROALBUR 20 08/30/2014      PT/INR:  Recent Labs    05/19/19 1501  LABPROT 18.9*  INR 1.6*   Radiology: DG Pelvis Portable  Result Date: 05/21/2019 CLINICAL DATA:  Concern for foreign body. EXAM: PORTABLE PELVIS 1-2 VIEWS COMPARISON:  None. FINDINGS: There is no radiopaque foreign body projecting over the patient's pelvis. There are several lines and tubes projecting over the pelvic bones that are favored to be external to the patient. There appears to be body wall edema and skin thickening overlying the bilateral hips. Mild bilateral hip osteoarthritis is noted. There is an irregular appearance of the right superior pubic ramus which may represent an age-indeterminate fracture. Vascular calcifications are noted. There are few surgical clips projected over the medial right thigh. IMPRESSION: 1. No radiopaque foreign body projecting over the patient's pelvis. 2. Irregular appearance of the right superior pubic ramus which may represent an age-indeterminate fracture or artifact. Electronically Signed   By: Constance Holster M.D.   On: 05/21/2019 00:50   DG Chest Port 1 View  Result Date: 05/21/2019 CLINICAL DATA:  Pleural effusion. CABG 2 days ago. EXAM: PORTABLE CHEST 1 VIEW COMPARISON:  Chest x-ray 05/20/2019 FINDINGS: Patient is status post median sternotomy for CABG. The Swan-Ganz catheter was removed. Right IJ sheath remains. Mediastinal drain and bilateral chest tubes are in place. Small pleural effusions remain, left greater than right. Bibasilar airspace disease likely reflects atelectasis. Visualized soft tissues and bony thorax  are. IMPRESSION: 1. Status post median sternotomy for CABG. 2. Small bilateral pleural effusions, left greater than right. 3. Bibasilar airspace disease likely reflects atelectasis. 4. Lung volumes and bibasilar airspace opacities are stable. Electronically Signed   By: San Morelle M.D.   On: 05/21/2019 06:40     Assessment/Plan: S/P Procedure(s) (LRB): CORONARY ARTERY BYPASS GRAFTING (CABG), on pump, times four using left internal mammary artery and right greater saphenous vein harvested endoscopically (N/A) 1. CV-SR with HR in the 70's this am. On Lopressor 12.5 mg bid 2. Pulmonary-On 2 liters of oxygen via [place]. Wean as able over next  few days. Chest tubes to bulbs and 130 cc of output last 24 hours. Hope to remove as patient likely will pull out on his own. CXR this am shows bibasilar atelectasis and small pleural effusions. Encourage incentive spirometer and flutter valve. 3. Volume overload-likely give low dose Lasix as long as BP not more labile this am, but will defer to Dr. Kipp Brood. 4. ABL anemia-H and H this am decreased to 8.2 and 26.5. On multivitamin  5. Confusion, delirium, history of Alzheimer's-on Donepezil as taken prior to surgery, Haldol. Continue sitter, soft restraints as needed 6. CBG 119/123/130. Pre op HGA1C 4.9. According to medical record, has a history of DM. ? If diet controlled as was not on any medication prior to admission. 7. Creatinine decreased from 1.36 to 1.27.   Donielle Liston Alba PA-C 05/21/2019 7:30 AM   Agree with above Confusion last night, continue sitter, and prn haldol.  Likely sundowning.  Would like to transfer to the floor but will need 24hr sitter Low blood pressure, so will hold off on lasix Will remove all lines and drains.  Jerrine Urschel [name]

## 2019-05-21 NOTE — Progress Notes (Signed)
CT surgery p.m. Rounds  Patient resting comfortably Stable hemodynamics Blood pressure 97/65, pulse 93, temperature 98.9 F (37.2 C), temperature source Oral, resp. rate (!) 26, height 5\' 9"  (1.753 m), weight 78.9 kg, SpO2 92 %.

## 2019-05-21 NOTE — Progress Notes (Addendum)
Came to room to instruct pt on flutter valve, pt was very upset and uncooperative, loudly states "no" and turns his head away.  Flutter valve in room for future use if pt will cooperate.    1353- came back to room and pt was agreeable to do flutter valve.

## 2019-05-21 NOTE — Anesthesia Postprocedure Evaluation (Signed)
Anesthesia Post Note  Patient: Joseph Byrd  Procedure(s) Performed: CORONARY ARTERY BYPASS GRAFTING (CABG), on pump, times four using left internal mammary artery and right greater saphenous vein harvested endoscopically (N/A Chest)     Patient location during evaluation: SICU Anesthesia Type: General Level of consciousness: sedated Pain management: pain level controlled Vital Signs Assessment: post-procedure vital signs reviewed and stable Respiratory status: patient remains intubated per anesthesia plan Cardiovascular status: stable Postop Assessment: no apparent nausea or vomiting Anesthetic complications: no    Last Vitals:  Vitals:   05/21/19 0645 05/21/19 0700  BP: (!) 67/51 (!) 70/49  Pulse: 69 (!) 59  Resp: 18 12  Temp:    SpO2: 100% 100%    Last Pain:  Vitals:   05/21/19 0400  TempSrc: Axillary  PainSc:                  Joseph Byrd

## 2019-05-22 ENCOUNTER — Inpatient Hospital Stay (HOSPITAL_COMMUNITY): Payer: Medicare HMO

## 2019-05-22 LAB — GLUCOSE, CAPILLARY
Glucose-Capillary: 104 mg/dL — ABNORMAL HIGH (ref 70–99)
Glucose-Capillary: 136 mg/dL — ABNORMAL HIGH (ref 70–99)
Glucose-Capillary: 87 mg/dL (ref 70–99)
Glucose-Capillary: 95 mg/dL (ref 70–99)
Glucose-Capillary: 100 mg/dL — ABNORMAL HIGH (ref 70–99)
Glucose-Capillary: 111 mg/dL — ABNORMAL HIGH (ref 70–99)

## 2019-05-22 LAB — BASIC METABOLIC PANEL
Anion gap: 6 (ref 5–15)
BUN: 17 mg/dL (ref 8–23)
CO2: 24 mmol/L (ref 22–32)
Calcium: 8.4 mg/dL — ABNORMAL LOW (ref 8.9–10.3)
Chloride: 112 mmol/L — ABNORMAL HIGH (ref 98–111)
Creatinine, Ser: 1.17 mg/dL (ref 0.61–1.24)
GFR calc Af Amer: >60 mL/min (ref >60)
GFR calc non Af Amer: 58 mL/min — ABNORMAL LOW (ref >60)
Glucose, Bld: 124 mg/dL — ABNORMAL HIGH (ref 70–99)
Potassium: 3.8 mmol/L (ref 3.5–5.1)
Sodium: 142 mmol/L (ref 135–145)

## 2019-05-22 LAB — CBC
HCT: 24.6 % — ABNORMAL LOW (ref 39.0–52.0)
Hemoglobin: 7.7 g/dL — ABNORMAL LOW (ref 13.0–17.0)
MCH: 30.4 pg (ref 26.0–34.0)
MCHC: 31.3 g/dL (ref 30.0–36.0)
MCV: 97.2 fL (ref 80.0–100.0)
Platelets: 472 10*3/uL — ABNORMAL HIGH (ref 150–400)
RBC: 2.53 MIL/uL — ABNORMAL LOW (ref 4.22–5.81)
RDW: 13.6 % (ref 11.5–15.5)
WBC: 10.2 10*3/uL (ref 4.0–10.5)
nRBC: 0 % (ref 0.0–0.2)

## 2019-05-22 MED ORDER — INSULIN ASPART 100 UNIT/ML ~~LOC~~ SOLN: 0.0000 [IU] | Freq: Three times a day (TID) | SUBCUTANEOUS | Status: DC

## 2019-05-22 MED ORDER — FE FUMARATE-B12-VIT C-FA-IFC PO CAPS
1.0000 | ORAL_CAPSULE | Freq: Two times a day (BID) | ORAL | Status: DC
  Administered 2019-05-22 – 2019-05-26 (×7): 1 via ORAL
  Filled 2019-05-22 (×9): qty 1

## 2019-05-22 NOTE — Plan of Care (Signed)
Pt's wife states that the pt does have a cane at home however she would feel more comfortable with having a Cane at home. Will pass along to Case Management. Will continue to monitor and assess for needs at home. Pending PT assessment for CIR admission

## 2019-05-22 NOTE — Evaluation (Signed)
Physical Therapy Evaluation Patient Details Name: Joseph Byrd MRN: [id number] DOB: May 21, 1934 Today's Date: 05/22/2019   History of Present Illness  85 y.o. African-American male with coronary artery disease and angioplasty to his mid LAD in 2012 and to distal dominant circumflex coronary artery in 2015, hypertension, hyperlipidemia, symptoms suggestive mild PAD and claudication. Pt underwent CABG x4 on 12/16 with some post-op confusion.  Clinical Impression  Pt presents to PT with deficits in functional mobility, gait, balance, endurance, cardiopulmonary function, and adherence to sternal precautions. Pt is able to perform transfers and ambulate at a close supervision level currently, however requires frequent cueing to maintain sternal precautions and is tachycardic during mobility. Pt will benefit from continued acute PT services to improve activity tolerance, balance, and independence in mobility, as well as to improve adherent to sternal precautions.    Follow Up Recommendations Home health PT;Supervision/Assistance - 24 hour    Equipment Recommendations  None recommended by PT(pt owns cain)    Recommendations for Other Services       Precautions / Restrictions Precautions Precautions: Fall;Sternal Precaution Comments: PT verbalizes precautions to pt, pt require reinforcement throughout sesion Restrictions Weight Bearing Restrictions: No Other Position/Activity Restrictions: sternal precautions      Mobility  Bed Mobility Overal bed mobility: (pt recieved and left sitting in recliner)                Transfers Overall transfer level: Needs assistance Equipment used: None Transfers: Sit to/from Stand Sit to Stand: Supervision         General transfer comment: PT providing verbal cues for sternal precautions  Ambulation/Gait Ambulation/Gait assistance: Supervision Gait Distance (Feet): 70 Feet Assistive device: None Gait Pattern/deviations: Drifts  right/left;Step-through pattern Gait velocity: reduced Gait velocity interpretation: 1.31 - 2.62 ft/sec, indicative of limited community ambulator General Gait Details: pt with increased lateral sway, shortened step through gait with reduced stride length  Stairs            Wheelchair Mobility    Modified Rankin (Stroke Patients Only)       Balance Overall balance assessment: Needs assistance Sitting-balance support: No upper extremity supported;Feet supported Sitting balance-Leahy Scale: Good Sitting balance - Comments: modI   Standing balance support: No upper extremity supported Standing balance-Leahy Scale: Good Standing balance comment: supervision                             Pertinent Vitals/Pain Pain Assessment: No/denies pain    Home Living Family/patient expects to be discharged to:: Private residence Living Arrangements: Spouse/significant other Available Help at Discharge: Family;Available 24 hours/day Type of Home: House Home Access: Stairs to enter Entrance Stairs-Rails: None Entrance Stairs-Number of Steps: 2 Home Layout: One level Home Equipment: Cane - single point      Prior Function Level of Independence: Independent with assistive device(s)         Comments: pt ambulatory with cane     Hand Dominance   Dominant Hand: Right    Extremity/Trunk Assessment   Upper Extremity Assessment Upper Extremity Assessment: Overall WFL for tasks assessed(limited 2/2 sternal precautions)    Lower Extremity Assessment Lower Extremity Assessment: Overall WFL for tasks assessed    Cervical / Trunk Assessment Cervical / Trunk Assessment: Normal  Communication   Communication: No difficulties  Cognition Arousal/Alertness: Awake/alert Behavior During Therapy: WFL for tasks assessed/performed Overall Cognitive Status: Within Functional Limits for tasks assessed  General Comments  General comments (skin integrity, edema, etc.): Pt tachy to 141 during ambulation with monitor reporting run of a-fib, HR recovers to low 100s with 1-2 minute seated rest. PT asymptomatic    Exercises     Assessment/Plan    PT Assessment Patient needs continued PT services  PT Problem List Decreased activity tolerance;Decreased balance;Decreased mobility;Decreased knowledge of use of DME;Decreased safety awareness;Decreased knowledge of precautions;Cardiopulmonary status limiting activity       PT Treatment Interventions DME instruction;Gait training;Stair training;Functional mobility training;Therapeutic activities;Therapeutic exercise;Balance training;Neuromuscular re-education;Patient/family education    PT Goals (Current goals can be found in the Care Plan section)  Acute Rehab PT Goals Patient Stated Goal: To return to independent mobility PT Goal Formulation: With patient Time For Goal Achievement: 06/05/19 Potential to Achieve Goals: Good    Frequency Min 3X/week   Barriers to discharge        Co-evaluation               AM-PAC PT "6 Clicks" Mobility  Outcome Measure Help needed turning from your back to your side while in a flat bed without using bedrails?: A Little Help needed moving from lying on your back to sitting on the side of a flat bed without using bedrails?: A Little Help needed moving to and from a bed to a chair (including a wheelchair)?: None Help needed standing up from a chair using your arms (e.g., wheelchair or bedside chair)?: None Help needed to walk in hospital room?: None Help needed climbing 3-5 steps with a railing? : A Little 6 Click Score: 21    End of Session Equipment Utilized During Treatment: (none) Activity Tolerance: Patient tolerated treatment well Patient left: in chair;with call bell/phone within reach;with chair alarm set;with nursing/sitter in room Nurse Communication: Mobility status PT Visit Diagnosis: Other  abnormalities of gait and mobility (R26.89)    Time: [id number] PT Time Calculation (min) (ACUTE ONLY): 12 min   Charges:   PT Evaluation $PT Eval Moderate Complexity: 1 Mod          Zenaida Niece, PT, DPT Acute Rehabilitation Pager: [phone]   Zenaida Niece 05/22/2019, 5:32 PM

## 2019-05-23 ENCOUNTER — Inpatient Hospital Stay (HOSPITAL_COMMUNITY): Payer: Medicare HMO

## 2019-05-23 LAB — CBC
HCT: 26.4 % — ABNORMAL LOW (ref 39.0–52.0)
Hemoglobin: 8.1 g/dL — ABNORMAL LOW (ref 13.0–17.0)
MCH: 30.8 pg (ref 26.0–34.0)
MCHC: 30.7 g/dL (ref 30.0–36.0)
MCV: 100.4 fL — ABNORMAL HIGH (ref 80.0–100.0)
Platelets: 544 10*3/uL — ABNORMAL HIGH (ref 150–400)
RBC: 2.63 MIL/uL — ABNORMAL LOW (ref 4.22–5.81)
RDW: 13.5 % (ref 11.5–15.5)
WBC: 9.4 10*3/uL (ref 4.0–10.5)
nRBC: 0 % (ref 0.0–0.2)

## 2019-05-23 LAB — BASIC METABOLIC PANEL
Anion gap: 9 (ref 5–15)
BUN: 14 mg/dL (ref 8–23)
CO2: 20 mmol/L — ABNORMAL LOW (ref 22–32)
Calcium: 8.5 mg/dL — ABNORMAL LOW (ref 8.9–10.3)
Chloride: 111 mmol/L (ref 98–111)
Creatinine, Ser: 1.13 mg/dL (ref 0.61–1.24)
GFR calc Af Amer: >60 mL/min (ref >60)
GFR calc non Af Amer: >60 mL/min (ref >60)
Glucose, Bld: 141 mg/dL — ABNORMAL HIGH (ref 70–99)
Potassium: 3.8 mmol/L (ref 3.5–5.1)
Sodium: 140 mmol/L (ref 135–145)

## 2019-05-23 LAB — TYPE AND SCREEN
ABO/RH(D): A POS
Antibody Screen: NEGATIVE
Unit division: 0
Unit division: 0

## 2019-05-23 LAB — BPAM RBC
Blood Product Expiration Date: 202101152359
Blood Product Expiration Date: 202101152359
ISSUE DATE / TIME: 202012121720
ISSUE DATE / TIME: 202012121720
Unit Type and Rh: 6200
Unit Type and Rh: 6200

## 2019-05-23 LAB — GLUCOSE, CAPILLARY
Glucose-Capillary: 105 mg/dL — ABNORMAL HIGH (ref 70–99)
Glucose-Capillary: 102 mg/dL — ABNORMAL HIGH (ref 70–99)
Glucose-Capillary: 117 mg/dL — ABNORMAL HIGH (ref 70–99)
Glucose-Capillary: 92 mg/dL (ref 70–99)

## 2019-05-23 MED ORDER — LORAZEPAM 2 MG/ML IJ SOLN
1.0000 mg | INTRAMUSCULAR | Status: DC | PRN
  Administered 2019-05-23 – 2019-05-25 (×6): 1 mg via INTRAVENOUS
  Filled 2019-05-23 (×7): qty 1

## 2019-05-23 MED ORDER — HALOPERIDOL LACTATE 5 MG/ML IJ SOLN
1.0000 mg | INTRAMUSCULAR | Status: DC | PRN
  Administered 2019-05-23 (×2): 1 mg via INTRAVENOUS
  Filled 2019-05-23: qty 1

## 2019-05-23 MED ORDER — THIAMINE HCL 100 MG/ML IJ SOLN
100.0000 mg | Freq: Every day | INTRAMUSCULAR | Status: DC
  Administered 2019-05-23 – 2019-05-26 (×4): 100 mg via INTRAVENOUS
  Filled 2019-05-23 (×4): qty 2

## 2019-05-23 MED ORDER — TAMSULOSIN HCL 0.4 MG PO CAPS
0.4000 mg | ORAL_CAPSULE | Freq: Every day | ORAL | Status: DC
  Administered 2019-05-23 – 2019-05-26 (×4): 0.4 mg via ORAL
  Filled 2019-05-23 (×4): qty 1

## 2019-05-23 NOTE — Progress Notes (Signed)
Pt continue to combative and uncooperative today, given ativan.  Spoke to [employer] earlier today.  She advised that if Pt did not become clearer as the day progresses, we could change the chest xray to a portable.  Pt continued, Pt does not follow command and wanted to leave and go and make phone calls earlier.  He did not realize he was in the hospital.  He wanted to call the police on staff and have [place] arrested.  His wife was in the room at the time and tried to help reorient him, but was unsuccessful.  Pt was given 1 mg Ativan.  He is now resting with his eyes closed.  He remains in soft wrist restrains and mittens - he continues to try and pull at mittens, foley, hospital gown and equipment when he can reach at it.

## 2019-05-23 NOTE — Progress Notes (Addendum)
      [address]       [place] [address]             [phone]      4 Days Post-Op Procedure(s) (LRB): CORONARY ARTERY BYPASS GRAFTING (CABG), on pump, times four using left internal mammary artery and right greater saphenous vein harvested endoscopically (N/A)   Subjective:  Patient with continued agitation overnight.  He has also been unable to void.  Per nursing he has over 500 cc of urine in his bladder.  Patient states he is doing okay. He was calm with me and states he needs to go to the bathroom.  Objective: Vital signs in last 24 hours: Temp:  [98 F (36.7 C)-99.5 F (37.5 C)] 99.4 F (37.4 C) (12/20 0653) Pulse Rate:  [75-97] 92 (12/20 0653) Cardiac Rhythm: Normal sinus rhythm (12/20 0701) Resp:  [16-26] 16 (12/20 0700) BP: (97-161)/(63-149) 97/63 (12/20 0700) SpO2:  [90 %-100 %] 100 % (12/20 0653)  Intake/Output from previous day: 12/19 0701 - 12/20 0700 In: 480 [P.O.:480] Out: 550 [Urine:550]  General appearance: cooperative and no distress Heart: regular rate and rhythm Lungs: clear to auscultation bilaterally Abdomen: soft, non-tender; bowel sounds normal; no masses,  no organomegaly Extremities: edema trace Wound: clean and dry  Lab Results: Recent Labs    05/22/19 0423 05/23/19 0447  WBC 10.2 9.4  HGB 7.7* 8.1*  HCT 24.6* 26.4*  PLT 472* 544*   BMET:  Recent Labs    05/22/19 0423 05/23/19 0447  NA 142 140  K 3.8 3.8  CL 112* 111  CO2 24 20*  GLUCOSE 124* 141*  BUN 17 14  CREATININE 1.17 1.13  CALCIUM 8.4* 8.5*    PT/INR: No results for input(s): LABPROT, INR in the last 72 hours. ABG    Component Value Date/Time   PHART 7.383 05/19/2019 1906   HCO3 24.0 05/19/2019 1906   TCO2 25 05/19/2019 1906   ACIDBASEDEF 1.0 05/19/2019 1906   O2SAT 97.0 05/19/2019 1906   CBG (last 3)  Recent Labs    05/22/19 1644 05/22/19 2157 05/23/19 0651  GLUCAP 100* 111* 105*    Assessment/Plan: S/P Procedure(s)  (LRB): CORONARY ARTERY BYPASS GRAFTING (CABG), on pump, times four using left internal mammary artery and right greater saphenous vein harvested endoscopically (N/A)  1.  CV- Sinus tachycardia, BP is labile- HR is elevated when patient is agitated- continue lopressor as BP allows 2. Pulm- no acute issues, previous CXR with small effusions, bilateral pneumothorax.. patient for CXR today, however would wait until he is more comfortable to obtain 3. Renal- slight bump in creatinine to 1.13, likely due to urinary retention, mildly edematous on exam, not currently on Lasix 4. GU- urinary retention, patient previously removed his own foley, I think due to agitation we will replace foley and start flomax, he likely has some urethral/prostate swelling due to trauma 5. Neuro- H/O Dementia- exacerbated by surgery/sundowning, continue prn Ativan, zyprexa, on home aricept 6. Deconditioning-  Patient ambulates when able, mobility is limited when patient is agitated 7. Dispo- urinary retention will place foley, start flomax, repeat BMET in AM, will obtain CXR later today once patient is more alert and calm   LOS: 5 days    Ellwood Handler, PA-C  05/23/2019   Patient with postop dilerium req restraints and small doses of ativan Keep in stepdown and monitor nsr Replace foley

## 2019-05-23 NOTE — Progress Notes (Signed)
Patient continued to be agitated after receiving 2 mg of haldol. Patient's agitation increased.Continues to have hallucinations and speak in a word salad format  Patient attempted to Geophysical data processor and nurse. Notified Jacklynn Ganong Received order for 1mg  ativan now and q4h and cancel haldol order. Sitter present, vitals are stable. Patient placed in soft wrist restraint along with mitts. Will continue to monitor.

## 2019-05-23 NOTE — Progress Notes (Signed)
Paged on call physician. Patient became very agitated, attempted to remove tele leads, and began hallucinating. Patient became very aggressive attempting to Engineer, maintenance (IT) and nurse. Patient placed in soft mitts and redirection was ineffective. Vitals stables. Received order for haldol and to renew safety 1 order. Will continue to monitor patient.

## 2019-05-23 NOTE — Plan of Care (Signed)
Patient has delirium and is disoriented.  Unable to educate at this time.

## 2019-05-23 NOTE — Plan of Care (Signed)

## 2019-05-24 LAB — BASIC METABOLIC PANEL
Anion gap: 9 (ref 5–15)
BUN: 13 mg/dL (ref 8–23)
CO2: 22 mmol/L (ref 22–32)
Calcium: 8.3 mg/dL — ABNORMAL LOW (ref 8.9–10.3)
Chloride: 112 mmol/L — ABNORMAL HIGH (ref 98–111)
Creatinine, Ser: 1.22 mg/dL (ref 0.61–1.24)
GFR calc Af Amer: >60 mL/min (ref >60)
GFR calc non Af Amer: 55 mL/min — ABNORMAL LOW (ref >60)
Glucose, Bld: 94 mg/dL (ref 70–99)
Potassium: 3.8 mmol/L (ref 3.5–5.1)
Sodium: 143 mmol/L (ref 135–145)

## 2019-05-24 LAB — GLUCOSE, CAPILLARY: Glucose-Capillary: 83 mg/dL (ref 70–99)

## 2019-05-24 MED ORDER — FUROSEMIDE 20 MG PO TABS
20.0000 mg | ORAL_TABLET | Freq: Once | ORAL | Status: AC
  Administered 2019-05-24: 20 mg via ORAL
  Filled 2019-05-24: qty 1

## 2019-05-24 MED ORDER — POTASSIUM CHLORIDE CRYS ER 20 MEQ PO TBCR
30.0000 meq | EXTENDED_RELEASE_TABLET | Freq: Once | ORAL | Status: AC
  Administered 2019-05-24: 30 meq via ORAL
  Filled 2019-05-24: qty 1

## 2019-05-24 NOTE — Progress Notes (Addendum)
      [address]       [place] [address]             [phone]        5 Days Post-Op Procedure(s) (LRB): CORONARY ARTERY BYPASS GRAFTING (CABG), on pump, times four using left internal mammary artery and right greater saphenous vein harvested endoscopically (N/A)  Subjective: Patient in soft restraints. He knows he is in the hospital this am.  Objective: Vital signs in last 24 hours: Temp:  [98.4 F (36.9 C)-99.1 F (37.3 C)] 98.4 F (36.9 C) (12/21 0042) Pulse Rate:  [84] 84 (12/20 1936) Cardiac Rhythm: Normal sinus rhythm;Bundle branch block (12/20 1900) Resp:  [20] 20 (12/20 1620) BP: (100-126)/(71-106) 126/106 (12/21 0042) SpO2:  [100 %] 100 % (12/21 0042)  Pre op weight 75.8 kg Current Weight  05/20/19 78.9 kg       Intake/Output from previous day: 12/20 0701 - 12/21 0700 In: 10 [I.V.:10] Out: 925 [Urine:925]   Physical Exam:  Cardiovascular: RRR Pulmonary: Diminished bibasilar breath sounds Abdomen: Soft, non tender, bowel sounds present. Extremities: Trace bilateral lower extremity edema. Wounds: Clean and dry.  No erythema or signs of infection.  Lab Results: CBC: Recent Labs    05/22/19 0423 05/23/19 0447  WBC 10.2 9.4  HGB 7.7* 8.1*  HCT 24.6* 26.4*  PLT 472* 544*   BMET:  Recent Labs    05/23/19 0447 05/24/19 0418  NA 140 143  K 3.8 3.8  CL 111 112*  CO2 20* 22  GLUCOSE 141* 94  BUN 14 13  CREATININE 1.13 1.22  CALCIUM 8.5* 8.3*    PT/INR:  Lab Results  Component Value Date   INR 1.6 (H) 05/19/2019   INR 1.2 05/19/2019   INR 1.15 03/19/2018   ABG:  INR: Will add last result for INR, ABG once components are confirmed Will add last 4 CBG results once components are confirmed  Assessment/Plan:  1. CV - SR. On Lopressor 12.5 mg bid. 2.  Pulmonary - On room air. Encourage incentive spirometer. 3. Volume Overload - Will give low dose Lasix this am 4.  Acute blood loss anemia - H and H yesterday 8.1  and 26.4. Continue Trinsicon 5. Neuro-confusion, delirium, agitation, history of Alzheimer's-on pre op dose of Donepezil, Olanzapine at nightsoft restraints, Ativan PRN 6. Supplement potassium 7. CBGs 117/92/83. According to medical history, patient with a history of diabetes. Pre op HGA1C 4.9. Glucose not above 143. Will stop accu checks and SS 8. GU-had urinary retention so foley re inserted. He was started on Flomax yesterday as well. Voiding trial in am  Donielle M ZimmermanPA-C 05/24/2019,7:10 AM   Agree with above dispo planning  Hadasa Gasner O Chase Arnall

## 2019-05-24 NOTE — Progress Notes (Signed)
Pt care started at 1500. Pt is alert times 1, he can tell his name and he knows his wife in bed side. He thinks he is in nursing home. Physical sitter in bed side, tylenol administered for mild fever, pt swallowed very well. safety restraints loosen and released just to give him some break, presence of pulse and circulation on both hands, Dr Kipp Brood came to the bedside and talked to the patient's wife.  Will continue to monitor the patient  Palma Holter, RN

## 2019-05-24 NOTE — Progress Notes (Signed)
CARDIAC REHAB PHASE I   Offered to walk with pt. Pt adamantly declining at this time. Pt states he walked earlier. Pt given IS. Pt able to demonstrate 375, encouraged continued use. Will continue to follow and encourage ambulation as pt allows.  [id number] Rufina Falco, RN BSN 05/24/2019 2:52 PM

## 2019-05-24 NOTE — Progress Notes (Signed)
Physical Therapy Treatment Patient Details Name: Joseph Byrd MRN: [id number] DOB: [birth year] Today's Date: 05/24/2019    History of Present Illness 85 y.o. African-American male with coronary artery disease and angioplasty to his mid LAD in 2012 and to distal dominant circumflex coronary artery in 2015, hypertension, hyperlipidemia, symptoms suggestive mild PAD and claudication. Pt underwent CABG x4 on 12/16 with some post-op confusion.    PT Comments    Patient confused this morning. Oriented to self only. Reoriented pt to location, date and situation and at end of session, able to state he was in the hospital and for his heart. Requires Mod A for bed mobility, standing and gait training due to instability and balance deficits. Able to follow 1-2 step commands inconsistently with repetition. Needs constant reminders to adhere to sternal precautions during mobility. Pt noted to have impaired attention, awareness, problem solving and memory today. Discharge recommendation may need to be adjusted pending improvement in cognition. Will follow.    Follow Up Recommendations  Home health PT;Supervision/Assistance - 24 hour     Equipment Recommendations  Rolling walker with 5" wheels(pending progress)    Recommendations for Other Services       Precautions / Restrictions Precautions Precautions: Fall;Sternal Precaution Booklet Issued: No Precaution Comments: bil restraints and mitts Restrictions Weight Bearing Restrictions: Yes Other Position/Activity Restrictions: sternal precautions    Mobility  Bed Mobility Overal bed mobility: Needs Assistance Bed Mobility: Supine to Sit;Sit to Supine     Supine to sit: Mod assist;HOB elevated Sit to supine: Mod assist   General bed mobility comments: Able to bring LEs to EOB, assist needed with trunk and scooting bottom to EOB. Increased time/effprt, Cues to refrain from using UEs to push. Assist to bring LES into bed to return to  supine.  Transfers Overall transfer level: Needs assistance Equipment used: 1 person hand held assist Transfers: Sit to/from Stand Sit to Stand: Mod assist         General transfer comment: Assist to power to standing with increased time/effort. Cues to not push up through UEs re: sternal precautions. Unsteady in standing initially. Stood from [employer] x2 for sitter to change sheets  Ambulation/Gait Ambulation/Gait assistance: Mod assist;+2 safety/equipment Gait Distance (Feet): 75 Feet Assistive device: 1 person hand held assist Gait Pattern/deviations: Drifts right/left;Step-through pattern;Scissoring;Narrow base of support Gait velocity: reduced Gait velocity interpretation: <1.8 ft/sec, indicate of risk for recurrent falls General Gait Details: Slow, unsteady gait with Mod A for support; narrow BoS with scissoring gait pattern at times. Bil knee instability noted. 1 standing rest break. Max cues for safety/direction.   Stairs             Wheelchair Mobility    Modified Rankin (Stroke Patients Only)       Balance Overall balance assessment: Needs assistance Sitting-balance support: Feet supported;No upper extremity supported Sitting balance-Leahy Scale: Fair Sitting balance - Comments: close supervision for safety, pulling at lines/gown/foley   Standing balance support: During functional activity Standing balance-Leahy Scale: Poor Standing balance comment: Requires Min A for standing balance and Mod A for dynamic standing/walking.                            Cognition Arousal/Alertness: Awake/alert Behavior During Therapy: WFL for tasks assessed/performed Overall Cognitive Status: Impaired/Different from baseline Area of Impairment: Orientation;Memory;Following commands;Safety/judgement;Awareness;Problem solving                 Orientation Level: Disoriented to;Place;Time;Situation  Memory: Decreased short-term memory;Decreased recall of  precautions Following Commands: Follows multi-step commands inconsistently;Follows one step commands inconsistently Safety/Judgement: Decreased awareness of deficits;Decreased awareness of safety Awareness: Intellectual Problem Solving: Slow processing;Requires verbal cues;Requires tactile cues General Comments: Oriented to self only. Pt confused. Able to state he was in the hospital towards end of session after being reoriented in beginning of session and able to vaguely mention heart for reason for being admitted, No awareness of precautions. Distracted, pulling at gown/foley catheter. Able to be redirected.      Exercises      General Comments General comments (skin integrity, edema, etc.): Sitter present during session to assist with line management.      Pertinent Vitals/Pain Pain Assessment: Faces Faces Pain Scale: No hurt    Home Living                      Prior Function            PT Goals (current goals can now be found in the care plan section) Progress towards PT goals: Not progressing toward goals - comment(due to worsening confusion)    Frequency    Min 3X/week      PT Plan Current plan remains appropriate    Co-evaluation              AM-PAC PT "6 Clicks" Mobility   Outcome Measure  Help needed turning from your back to your side while in a flat bed without using bedrails?: A Little Help needed moving from lying on your back to sitting on the side of a flat bed without using bedrails?: A Lot Help needed moving to and from a bed to a chair (including a wheelchair)?: A Lot Help needed standing up from a chair using your arms (e.g., wheelchair or bedside chair)?: A Lot Help needed to walk in hospital room?: A Lot Help needed climbing 3-5 steps with a railing? : A Lot 6 Click Score: 13    End of Session Equipment Utilized During Treatment: Gait belt Activity Tolerance: Patient tolerated treatment well Patient left: in bed;with call  bell/phone within reach;with bed alarm set;with restraints reapplied;with nursing/sitter in room Nurse Communication: Mobility status PT Visit Diagnosis: Other abnormalities of gait and mobility (R26.89);Unsteadiness on feet (R26.81)     Time: [id number] PT Time Calculation (min) (ACUTE ONLY): 24 min  Charges:  $Gait Training: 8-22 mins $Therapeutic Activity: 8-22 mins                     Marisa Severin, PT, DPT Acute Rehabilitation Services Pager [phone] Office [place] 05/24/2019, 11:52 AM

## 2019-05-24 NOTE — Progress Notes (Signed)
Paged Dr. Lucianne Lei trigt. Pt's Safety 1 expires in one hour. Order renewed.

## 2019-05-24 NOTE — Progress Notes (Signed)
Inpatient Rehab Admissions:  Inpatient Rehab Consult received.  Note PT recommending HH, and with history of dementia (exacerbated in hospital setting), would likely do well in familiar environment.  Will sign off at this time.   Signed: Shann Medal, PT, DPT Admissions Coordinator [phone] 05/24/19  9:24 AM

## 2019-05-24 NOTE — Plan of Care (Signed)
Patient is confused, delirim

## 2019-05-25 NOTE — Progress Notes (Signed)
Bladder scan done, obtained 50 ml  Volume. Continue to monitor.

## 2019-05-25 NOTE — Care Management Important Message (Signed)
Important Message  Patient Details  Name: Joseph Byrd MRN: [id number] Date of Birth: 1933/06/26   Medicare Important Message Given:  Yes     Memory Argue 05/25/2019, 11:03 AM

## 2019-05-25 NOTE — Progress Notes (Signed)
Physical Therapy Treatment Patient Details Name: Joseph Byrd MRN: [id number] DOB: Jun 30, 1933 Today's Date: 05/25/2019    History of Present Illness 85 y.o. African-American male with coronary artery disease and angioplasty to his mid LAD in 2012 and to distal dominant circumflex coronary artery in 2015, hypertension, hyperlipidemia, symptoms suggestive mild PAD and claudication. Pt underwent CABG x4 on 12/16 with some post-op confusion.    PT Comments    Patient progressing well towards PT goals. Confusion seems improved from prior session. Pt oriented x4 and able to state part of his sternal precautions. Requires min A for bed mobility, Min-mod A for transfers and Min A for gait training with RW for safety. Pt will need hands on assist at home initially until strength/balance improve and recommend use of RW to decrease fall risk. Reviewed sternal precautions and needed cues to adhere to them during functional tasks/mobility. Will continue to follow.    Follow Up Recommendations  Home health PT;Supervision/Assistance - 24 hour     Equipment Recommendations  Rolling walker with 5" wheels;3in1 (PT)    Recommendations for Other Services       Precautions / Restrictions Precautions Precautions: Fall;Sternal Precaution Booklet Issued: No Precaution Comments: bil mitts; reviewed sternal precautions Restrictions Weight Bearing Restrictions: Yes Other Position/Activity Restrictions: sternal precautions    Mobility  Bed Mobility Overal bed mobility: Needs Assistance Bed Mobility: Supine to Sit     Supine to sit: Min assist;HOB elevated     General bed mobility comments: Able to bring LEs to EOB, assist with trunk. Cues to refrain from pulling using UEs. Able to scoot to EOB without assist.  Transfers Overall transfer level: Needs assistance Equipment used: 1 person hand held assist Transfers: Sit to/from Stand Sit to Stand: Min assist;Mod assist         General  transfer comment: Assist to power to standing with cues for hand placement across chest and use of momentum; posterior bias once upright needing Min A for support. Stood from [employer], from chair x1, transferred to recliner post ambulation.  Ambulation/Gait Ambulation/Gait assistance: Min assist Gait Distance (Feet): 120 Feet Assistive device: Rolling walker (2 wheeled) Gait Pattern/deviations: Drifts right/left;Step-through pattern;Scissoring;Narrow base of support Gait velocity: reduced   General Gait Details: Slow, unsteady gait with Min A and RW for support; narrow BoS with scissoring gait x1, cues for RW proximity/management esp with turns.   Stairs             Wheelchair Mobility    Modified Rankin (Stroke Patients Only)       Balance Overall balance assessment: Needs assistance Sitting-balance support: Feet supported;No upper extremity supported Sitting balance-Leahy Scale: Fair Sitting balance - Comments: Supervision for safety.   Standing balance support: During functional activity Standing balance-Leahy Scale: Poor Standing balance comment: Requires Min A for standing balance and RW vs HHAife                            Cognition Arousal/Alertness: Awake/alert Behavior During Therapy: WFL for tasks assessed/performed Overall Cognitive Status: Impaired/Different from baseline Area of Impairment: Memory;Following commands;Safety/judgement;Problem solving                     Memory: Decreased short-term memory;Decreased recall of precautions Following Commands: Follows one step commands with increased time Safety/Judgement: Decreased awareness of deficits;Decreased awareness of safety   Problem Solving: Requires verbal cues;Slow processing General Comments: Seems clearer today; A&Ox4. Crying tears of joy once  sitting EOB per report. Able to follow 1-2 step commands today. Able to state part of his sternal precautions but needs cues to adhere to  them during mobility.      Exercises      General Comments General comments (skin integrity, edema, etc.): wife present during session. VSS.      Pertinent Vitals/Pain Pain Assessment: No/denies pain    Home Living                      Prior Function            PT Goals (current goals can now be found in the care plan section) Progress towards PT goals: Progressing toward goals    Frequency    Min 3X/week      PT Plan Current plan remains appropriate    Co-evaluation              AM-PAC PT "6 Clicks" Mobility   Outcome Measure  Help needed turning from your back to your side while in a flat bed without using bedrails?: A Little Help needed moving from lying on your back to sitting on the side of a flat bed without using bedrails?: A Little Help needed moving to and from a bed to a chair (including a wheelchair)?: A Lot Help needed standing up from a chair using your arms (e.g., wheelchair or bedside chair)?: A Lot Help needed to walk in hospital room?: A Little Help needed climbing 3-5 steps with a railing? : A Lot 6 Click Score: 15    End of Session Equipment Utilized During Treatment: Gait belt Activity Tolerance: Patient tolerated treatment well Patient left: in chair;with call bell/phone within reach;with nursing/sitter in room;with family/visitor present Nurse Communication: Mobility status PT Visit Diagnosis: Other abnormalities of gait and mobility (R26.89);Unsteadiness on feet (R26.81)     Time: 1041-1100 PT Time Calculation (min) (ACUTE ONLY): 19 min  Charges:  $Gait Training: 8-22 mins                     Marisa Severin, PT, DPT Acute Rehabilitation Services Pager [phone] Office [place] 05/25/2019, 12:09 PM

## 2019-05-25 NOTE — Progress Notes (Signed)
Wife at bedside, sitter discontinued. continue to monitor.

## 2019-05-25 NOTE — Progress Notes (Signed)
Pt with no urine output since removal of foley catheter. Bladder scan reveals 277ml urine in bladder. Will notify MD. Will continue to monitor.

## 2019-05-25 NOTE — Progress Notes (Signed)
CARDIAC REHAB PHASE I   Offered to walk with pt. Pt declining stating he walked earlier and just got back into bed. Pts wife states they are hopeful for d/c today. Brief education completed with pt and wife. Pt educated on importance of site care and monitoring incision daily. Encouraged IS use, walks, and sternal precautions. Pt given in-the-tube sheet along with heart healthy diet and cardiac surgery booklet. Reviewed restrictions and encouraged ambulation as able. Will refer to CRP II [place], pts wife unsure if they will be able to participate.  [id number] Rufina Falco, RN BSN 05/25/2019 2:26 PM

## 2019-05-25 NOTE — TOC Transition Note (Signed)
Transition of Care [facility]) - CM/SW Discharge Note   Patient Details  Name: Joseph Byrd MRN: [id number] Date of Birth: 03-14-1934  [facility] CM/SW Contact:  Zenon Mayo, RN Phone Number: 05/25/2019, 3:29 PM   Clinical Narrative:    Patient will need 3 n 1 and rolling walker wife ok with [employer], NCM contacted [place] with [employer] , he will bring to patient room prior to dc. He is set up with outpatient physical therapy in [place].  Wife informed of this information.   Final next level of care: Home/Self Care Barriers to Discharge: No Barriers Identified   Patient Goals and CMS Choice        Discharge Placement                       Discharge Plan and Services                DME Arranged: 3-N-1, Walker rolling DME Agency: [employer] Date DME Agency Contacted: 05/25/19 Time DME Agency Contacted: 46 Representative spoke with at DME Agency: zack HH Arranged: NA          Social Determinants of Health ([place]) Interventions     Readmission Risk Interventions No flowsheet data found.

## 2019-05-25 NOTE — Progress Notes (Signed)
     [address]       [place] [address]             [phone]       Straight cathed again this am  Vitals:   05/25/19 0356 05/25/19 0800  BP: 117/71   Pulse: 79 80  Resp: (!) 9   Temp: 97.6 F (36.4 C)   SpO2: 99% 96%    Awake Sinus EWOB Incision clean   POD 6 s/p CABG Dementia with delirium Urinary retention  Will place foley with leg bag, urology consult Home soon

## 2019-05-25 NOTE — Progress Notes (Signed)
In and out cath done aseptically obtained 350 ml urine, tolerated well. Continue to monitor.

## 2019-05-26 MED ORDER — TAMSULOSIN HCL 0.4 MG PO CAPS: 0.4000 mg | ORAL_CAPSULE | Freq: Every day | ORAL | 3 refills | Status: DC

## 2019-05-26 MED ORDER — OLANZAPINE 5 MG PO TBDP: 5.0000 mg | ORAL_TABLET | Freq: Every day | ORAL | 0 refills | Status: DC

## 2019-05-26 NOTE — Progress Notes (Signed)
      [address]       [place] [address]             [phone]      7 Days Post-Op Procedure(s) (LRB): CORONARY ARTERY BYPASS GRAFTING (CABG), on pump, times four using left internal mammary artery and right greater saphenous vein harvested endoscopically (N/A)   Subjective:  Up in chair, he has no complaints.  Happy to be going home.  Objective: Vital signs in last 24 hours: Temp:  [97.8 F (36.6 C)-99.1 F (37.3 C)] 98.9 F (37.2 C) (12/23 0753) Pulse Rate:  [85-103] 85 (12/23 0753) Cardiac Rhythm: Normal sinus rhythm (12/23 0800) Resp:  [11-30] 16 (12/23 0753) BP: (104-135)/(39-82) 108/69 (12/23 0753) SpO2:  [95 %-99 %] 98 % (12/23 0408) Weight:  [77.2 kg] 77.2 kg (12/23 0500)  Intake/Output from previous day: 12/22 0701 - 12/23 0700 In: 480 [P.O.:480] Out: 450 [Urine:450] Intake/Output this shift: Total I/O In: -  Out: 100 [Urine:100]  General appearance: alert, cooperative and no distress Heart: regular rate and rhythm Lungs: clear to auscultation bilaterally Abdomen: soft, non-tender; bowel sounds normal; no masses,  no organomegaly Extremities: edema trace Wound: clean and dyr  Lab Results: No results for input(s): WBC, HGB, HCT, PLT in the last 72 hours. BMET:  Recent Labs    05/24/19 0418  NA 143  K 3.8  CL 112*  CO2 22  GLUCOSE 94  BUN 13  CREATININE 1.22  CALCIUM 8.3*    PT/INR: No results for input(s): LABPROT, INR in the last 72 hours. ABG    Component Value Date/Time   PHART 7.383 05/19/2019 1906   HCO3 24.0 05/19/2019 1906   TCO2 25 05/19/2019 1906   ACIDBASEDEF 1.0 05/19/2019 1906   O2SAT 97.0 05/19/2019 1906   CBG (last 3)  Recent Labs    05/23/19 1603 05/23/19 2135 05/24/19 0640  GLUCAP 117* 92 83    Assessment/Plan: S/P Procedure(s) (LRB): CORONARY ARTERY BYPASS GRAFTING (CABG), on pump, times four using left internal mammary artery and right greater saphenous vein harvested endoscopically  (N/A)  1. CV- NSR, BP controlled- continue Lopressor 2. Pulm- no acute issues, continue IS 3. Renal creatinine has been stable, weight is stable, no lasix at this time 4. Neuro- dementia stable, continue home medications 5. Dispo- patient stable, will dc home today   LOS: 8 days    Ellwood Handler, PA-C  05/26/2019

## 2019-05-26 NOTE — Progress Notes (Signed)
Pt ambulated to the bathroom saying he needed to have a bowel movement. Pt was able to urinate in the toilet. Volume unknown. Repeat bladder scan shows 251ml still in bladder. Will continue to monitor.

## 2019-05-26 NOTE — Progress Notes (Signed)
Discharged home accompanied by wife, discharge instructions given to wife, belongings taken home. Walker and 3 in 1 chair with pt.

## 2019-05-26 NOTE — Progress Notes (Signed)
Pt unable to urinate during night.Bladder scan on pt revealed 276m urine present in bladder. In and out cath attempted but met with resistance and unable to remove any urine from bladder. Pt refusing another attempt. Will pass information along to oncoming nurse. Will continue to monitor pt.

## 2019-05-26 NOTE — Progress Notes (Signed)
CARDIAC REHAB PHASE I   Pt and wife sitting in room, state pt is discharging today. Offered to walk with pt, pt states he's saving it to go home. Pt able to demonstrate 1750 on IS today. Pts wife denies further questions or concerns re yesterdays education. Pt referred to CRP II [place].  [id number] Rufina Falco, RN BSN 05/26/2019 9:24 AM

## 2019-06-01 ENCOUNTER — Other Ambulatory Visit: Payer: Self-pay

## 2019-06-01 ENCOUNTER — Ambulatory Visit: Payer: Medicare HMO | Attending: Thoracic Surgery (Cardiothoracic Vascular Surgery) | Admitting: Physical Therapy

## 2019-06-01 ENCOUNTER — Encounter: Payer: Self-pay | Admitting: Physical Therapy

## 2019-06-01 DIAGNOSIS — M6281 Muscle weakness (generalized): Secondary | ICD-10-CM | POA: Insufficient documentation

## 2019-06-01 NOTE — Therapy (Signed)
[facility] [place], [place], [address] Phone: [phone]   Fax:  [phone]  Physical Therapy Evaluation  Patient Details  Name: Joseph Byrd MRN: [id number] Date of Birth: [birth year] Referring Provider (PT): Melodie Bouillon, MD  Encounter Date: 06/01/2019  PT End of Session - 06/01/19 1902    Visit Number  1    Number of Visits  8    Date for PT Re-Evaluation  08/03/19    Authorization Type  Progress note at 10th visit; kx modifier at 15th visit    PT Start Time  1345    PT Stop Time  1430    PT Time Calculation (min)  45 min    Equipment Utilized During Treatment  Gait belt    Activity Tolerance  Patient limited by fatigue;Patient tolerated treatment well    Behavior During Therapy  [facility] for tasks assessed/performed       Past Medical History:  Diagnosis Date  . Arthritis    "knees, wrists, knuckles" (04/26/2014)  . CAD (coronary artery disease)   . Headache    "had a couple/wk til ~ 2 wks ago" (04/26/2014)  . Hyperlipidemia   . Hypertension   . LBBB (left bundle branch block) 07/06/2018  . PAD (peripheral artery disease) ([place])   . Type II diabetes mellitus ([place])     Past Surgical History:  Procedure Laterality Date  . BACK SURGERY    . CATARACT EXTRACTION W/ INTRAOCULAR LENS  IMPLANT, BILATERAL Bilateral ~ 2014  . CORONARY ANGIOPLASTY WITH STENT PLACEMENT  08/2010; 04/26/2014   "1, LAD; 1"  . CORONARY ARTERY BYPASS GRAFT N/A 05/19/2019   Procedure: CORONARY ARTERY BYPASS GRAFTING (CABG), on pump, times four using left internal mammary artery and right greater saphenous vein harvested endoscopically;  Surgeon: Lajuana Matte, MD;  Location: [facility];  Service: Open Heart Surgery;  Laterality: N/A;  . LEFT HEART CATH AND CORONARY ANGIOGRAPHY N/A 05/18/2019   Procedure: LEFT HEART CATH AND CORONARY ANGIOGRAPHY;  Surgeon: Adrian Prows, MD;  Location: [facility] CV LAB;  Service: Cardiovascular;   Laterality: N/A;  . LEFT HEART CATHETERIZATION WITH CORONARY ANGIOGRAM N/A 04/26/2014   Procedure: LEFT HEART CATHETERIZATION WITH CORONARY ANGIOGRAM;  Surgeon: Laverda Page, MD;  Location: [facility] CATH LAB;  Service: Cardiovascular;  Laterality: N/A;  . LUMBAR LAMINECTOMY/DECOMPRESSION MICRODISCECTOMY  01/2010    There were no vitals filed for this visit.   Subjective Assessment - 06/01/19 1853    Subjective  COVID-19 screening performed upon arrival.Patient arrives to physical therapy with reports of fatigue and weakness secondary to a CABG on 05/17/2019. Patient reports getting some assistance with ADLs but reports his wife has been doing most of the home activities to allow him to rest. Patient reports performing walking program around the house and is maxed at 2 laps before fatigue. Patient ambulates with [facility] for safety. Patient's goals are to improve movement, improvement strength and improve walking.    Pertinent History  CABG 05/19/2019, HTN, history of lumbar surgery    Limitations  House hold activities    How long can you walk comfortably?  short distances, 2 laps around the home with AD.    Patient Stated Goals  Get stronger    Currently in Pain?  No/denies         [facility] PT Assessment - 06/01/19 0001      Assessment   Medical Diagnosis  Weaknes    Referring Provider (PT)  Melodie Bouillon, MD  Onset Date/Surgical Date  05/19/19    Next MD Visit  January 2020    Prior Therapy  no      Precautions   Precautions  Other (comment)    Precaution Comments  3 weeks post op CABG, vital assessment frequently      Balance Screen   Has the patient fallen in the past 6 months  No    Has the patient had a decrease in activity level because of a fear of falling?   No    Is the patient reluctant to leave their home because of a fear of falling?   No      Home Environment   Living Environment  Private residence    Living Arrangements  Spouse/significant other      Prior  Function   Level of Independence  Needs assistance with ADLs;Requires assistive device for independence      Observation/Other Assessments   Skin Integrity  incisions healing well with moderate scabbing for both sternal incision and right lower leg incision      ROM / Strength   AROM / PROM / Strength  AROM;Strength      AROM   Overall AROM Comments  Decreased bilateral shoulder AROM      Strength   Strength Assessment Site  Hip;Knee    Right/Left Hip  Right;Left    Right Hip Flexion  3+/5    Left Hip Flexion  3+/5    Right/Left Knee  Right;Left    Right Knee Flexion  4-/5    Right Knee Extension  4/5    Left Knee Flexion  4/5    Left Knee Extension  4/5      Transfers   Five time sit to stand comments   modified with UE support x13 seconds      Ambulation/Gait   Assistive device  Straight cane    Gait Pattern  Step-through pattern;Decreased stride length      6 Minute Walk- Baseline   6 Minute Walk- Baseline  yes   2 minute walk test   HR (bpm)  80    02 Sat (%RA)  98 %      6 Minute walk- Post Test   6 Minute Walk Post Test  yes   2 Minute Walk Test   HR (bpm)  108    02 Sat (%RA)  90 %      6 minute walk test results    Aerobic Endurance Distance Walked  139    Endurance additional comments  with SPC, rest break required.                Objective measurements completed on examination: See above findings.              PT Education - 06/01/19 1902    Education Details  seated marching, heel raises, hip abduction, pillow squeeze    Person(s) Educated  Patient    Methods  Explanation;Demonstration;Handout    Comprehension  Verbalized understanding;Returned demonstration          PT Long Term Goals - 06/01/19 1904      PT LONG TERM GOAL #1   Title  Patient will be independent with HEP    Time  8    Period  Weeks    Status  New      PT LONG TERM GOAL #2   Title  Patient will report ability to perform ADLs and home activities with  modfied BORG/ RPE  scale no greater than 2-3/10.    Time  8    Period  Weeks    Status  New      PT LONG TERM GOAL #3   Title  Patient will improve functional strength as noted by the ability to perform modifiex 5x sit to stand in 11 seconds or less.    Time  8    Period  Weeks    Status  New      PT LONG TERM GOAL #4   Title  Patient will improve cardiovascular health as noted by the ability to ambulate 150 feet with AD during 2 minute walk test.    Time  8    Period  Weeks    Status  New             Plan - 06/01/19 1919    Clinical Impression Statement  Patient is an 85 year old male who presents to physical therapy with generalized weakness and physical and cardiac deconditioning secondary to a CABG on 05/17/2019. Patient noted with decreased functional LE strength as noted by modified 5x sit to stand in 13 seconds. Patient ambulated 139 feet in 2 minutes with one rest break and SPC. Patient's vitals returned to baseline in approximately 2 minutes. Patient and PT discussed importance of HEP to improve cardiovascular conditioning and strength. Patient reported understanding and agreement. Patient would benefit from skilled physical therapy to address deficits and address patient's goals.    Personal Factors and Comorbidities  Age;Fitness;Comorbidity 2    Comorbidities  CABG 05/17/2019. HTN    Examination-Activity Limitations  Locomotion Level    Stability/Clinical Decision Making  Stable/Uncomplicated    Clinical Decision Making  Low    Rehab Potential  Good    PT Frequency  1x / week   1x per week due to high copay   PT Duration  8 weeks    PT Treatment/Interventions  ADLs/Self Care Home Management;Gait training;Stair training;Functional mobility training;Therapeutic activities;Therapeutic exercise;Balance training;Neuromuscular re-education;Energy conservation;Patient/family education;Manual techniques    PT Next Visit Plan  Please monitor vitals, nustep, gentle strengthening  of LEs, UEs and core (3 weeks post op CABG)    PT Home Exercise Plan  see patient education section    Consulted and Agree with Plan of Care  Patient       Patient will benefit from skilled therapeutic intervention in order to improve the following deficits and impairments:  Cardiopulmonary status limiting activity, Decreased activity tolerance, Decreased strength, Difficulty walking, Decreased balance  Visit Diagnosis: Muscle weakness (generalized)     Problem List Patient Active Problem List   Diagnosis Date Noted  . 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 Coronary artery disease 05/19/2019  . CAD (coronary artery disease) 05/18/2019  . Memory impairment 09/21/2018  . Claudication in peripheral vascular disease ([place]) 07/06/2018  . Angina pectoris ([place]) 03/19/2018  . Overweight (BMI 25.0-29.9) 12/18/2017  . Anemia 12/16/2016  . Vitamin D deficiency 08/30/2014  . Postsurgical percutaneous transluminal coronary angioplasty (PTCA) status 04/26/2014  . Angina pectoris associated with type 2 diabetes mellitus ([place]) 04/25/2014  . Cataract   . Arthritis of both knees 01/05/2013  . Hyperglycemia 01/05/2013  . Essential hypertension, benign 08/22/2010  . CAD, multiple vessel 08/22/2010  . Hyperlipidemia with target LDL less than 70 08/22/2010    Gabriela Eves, PT, DPT 06/01/2019, 7:39 PM  [facility] [address] [place], [place], [address] Phone: [phone]   Fax:  [phone]  Name: Percy  Landau MRN: [id number] Date of Birth: [birth year]

## 2019-06-02 ENCOUNTER — Encounter: Payer: Self-pay | Admitting: Cardiology

## 2019-06-02 ENCOUNTER — Ambulatory Visit: Payer: Medicare HMO | Admitting: Cardiology

## 2019-06-02 VITALS — BP 149/85 | HR 76 | Temp 98.1°F | Ht 69.0 in | Wt 179.6 lb

## 2019-06-02 DIAGNOSIS — I10 Essential (primary) hypertension: Secondary | ICD-10-CM | POA: Diagnosis not present

## 2019-06-02 DIAGNOSIS — I25118 Atherosclerotic heart disease of native coronary artery with other forms of angina pectoris: Secondary | ICD-10-CM

## 2019-06-02 DIAGNOSIS — Z951 Presence of aortocoronary bypass graft: Secondary | ICD-10-CM

## 2019-06-02 DIAGNOSIS — I739 Peripheral vascular disease, unspecified: Secondary | ICD-10-CM

## 2019-06-02 MED ORDER — METOPROLOL SUCCINATE ER 50 MG PO TB24: 50.0000 mg | ORAL_TABLET | Freq: Every day | ORAL | 1 refills | Status: DC

## 2019-06-02 MED ORDER — CLOPIDOGREL BISULFATE 75 MG PO TABS: 75.0000 mg | ORAL_TABLET | Freq: Every day | ORAL | 3 refills | Status: DC

## 2019-06-02 NOTE — Progress Notes (Signed)
Primary Physician/Referring:  Dettinger, [name], MD  Patient ID: Joseph Byrd, male    DOB: [birth year], 85 y.o.   MRN: [id number]  Chief Complaint  Patient presents with  . Coronary Artery Disease    cath  . Hypertension   HPI:    HPI: Joseph Byrd  is a 85 y.o. African-American male with coronary artery disease and angioplasty to his mid LAD in 2012 and to distal dominant circumflex coronary artery in 2015, hypertension, hyperlipidemia, symptoms suggestive mild PAD and claudication, He underwent coronary angiography on 05/18/2019 and was found to have significant ulcerated left main disease and also multivessel disease and was recommended CABG.  He underwent four-vessel bypass on 05/19/2019 by Dr. Melodie Bouillon.  Postoperative course was complicated by slow progress and also agitation and sundowning.  He now presents here for follow-up.   He has recuperated well, is walking and ablating in the house without any restrictions or difficulty.  He has not had any further chest pain, no dyspnea, has right leg edema since surgery but no pain.  Mental status is completely cleared.  Past Medical History:  Diagnosis Date  . Arthritis    "knees, wrists, knuckles" (04/26/2014)  . CAD (coronary artery disease)   . Headache    "had a couple/wk til ~ 2 wks ago" (04/26/2014)  . Hyperlipidemia   . Hypertension   . LBBB (left bundle branch block) 07/06/2018  . PAD (peripheral artery disease) ([place])   . 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 Type II diabetes mellitus ([place])    Past Surgical History:  Procedure Laterality Date  . BACK SURGERY    . CATARACT EXTRACTION W/ INTRAOCULAR LENS  IMPLANT, BILATERAL Bilateral ~ 2014  . CORONARY ANGIOPLASTY WITH STENT PLACEMENT  08/2010; 04/26/2014   "1, LAD; 1"  . CORONARY ARTERY BYPASS GRAFT N/A 05/19/2019   Procedure: CORONARY ARTERY BYPASS GRAFTING (CABG), on pump, times four using left  internal mammary artery and right greater saphenous vein harvested endoscopically;  Surgeon: Lajuana Matte, MD;  Location: [place];  Service: Open Heart Surgery;  Laterality: N/A;  . LEFT HEART CATH AND CORONARY ANGIOGRAPHY N/A 05/18/2019   Procedure: LEFT HEART CATH AND CORONARY ANGIOGRAPHY;  Surgeon: Adrian Prows, MD;  Location: [facility];  Service: Cardiovascular;  Laterality: N/A;  . LEFT HEART CATHETERIZATION WITH CORONARY ANGIOGRAM N/A 04/26/2014   Procedure: LEFT HEART CATHETERIZATION WITH CORONARY ANGIOGRAM;  Surgeon: Laverda Page, MD;  Location: [facility] CATH LAB;  Service: Cardiovascular;  Laterality: N/A;  . LUMBAR LAMINECTOMY/DECOMPRESSION MICRODISCECTOMY  01/2010   Social History   Socioeconomic History  . Marital status: Married    Spouse name: Clarise Cruz  . Number of children: 6  . Years of education: 3  . Highest education level: 3rd grade  Occupational History  . Occupation: retired  Tobacco Use  . Smoking status: Former Smoker    Packs/day: 0.12    Years: 10.00    Pack years: 1.20    Types: Cigarettes    Quit date: 09/04/1982    Years since quitting: 36.7  . Smokeless tobacco: Never Used  Substance and Sexual Activity  . Alcohol use: No  . Drug use: No  . Sexual activity: Yes  Other Topics Concern  . Not on file  Social History Narrative  . Not on file   Social Determinants of Health   Financial Resource Strain:   .  Difficulty of Paying Living Expenses: Not on file  Food Insecurity:   . Worried About Charity fundraiser in the Last Year: Not on file  . Ran Out of Food in the Last Year: Not on file  Transportation Needs:   . Lack of Transportation (Medical): Not on file  . Lack of Transportation (Non-Medical): Not on file  Physical Activity:   . Days of Exercise per Week: Not on file  . Minutes of Exercise per Session: Not on file  Stress:   . Feeling of Stress : Not on file  Social Connections:   . Frequency of Communication with Friends and  Family: Not on file  . Frequency of Social Gatherings with Friends and Family: Not on file  . Attends Religious Services: Not on file  . Active Member of Clubs or Organizations: Not on file  . Attends Archivist Meetings: Not on file  . Marital Status: Not on file  Intimate Partner Violence:   . Fear of Current or Ex-Partner: Not on file  . Emotionally Abused: Not on file  . Physically Abused: Not on file  . Sexually Abused: Not on file   ROS  Review of Systems  Constitution: Negative for chills, decreased appetite, malaise/fatigue and weight gain.  Eyes: Positive for visual halos (chronic).  Cardiovascular: Positive for chest pain and claudication (mild and stable, left leg worse). Negative for dyspnea on exertion, leg swelling and syncope.  Endocrine: Negative for cold intolerance.  Hematologic/Lymphatic: Does not bruise/bleed easily.  Musculoskeletal: Negative for joint swelling.  Gastrointestinal: Negative for abdominal pain, anorexia, change in bowel habit, hematochezia and melena.  Neurological: Negative for headaches and light-headedness.  Psychiatric/Behavioral: Negative for depression and substance abuse.  All other systems reviewed and are negative.  Objective  Blood pressure (!) 149/85, pulse 76, temperature 98.1 F (36.7 C), height 5\' 9"  (1.753 m), weight 179 lb 9.6 oz (81.5 kg), SpO2 95 %. Body mass index is 26.52 kg/m.   Vitals with BMI 06/02/2019 05/26/2019 05/26/2019  Height 5\' 9"  - -  Weight 179 lbs 10 oz - 170 lbs 3 oz  BMI 99991111 - [id number]  Systolic 123456 [id number] -  Diastolic 85 69 -  Pulse 76 85 -    Physical Exam  Constitutional: He appears well-developed and well-nourished.  HENT:  Head: Normocephalic.  Cardiovascular: S1 normal and S2 normal. Exam reveals no gallop.  Murmur heard.  Harsh early systolic murmur is present at the upper right sternal border and apex. Pulses:      Carotid pulses are 2+ on the right side and 2+ on the left side.       Femoral pulses are 2+ on the right side and 2+ on the left side.      Popliteal pulses are 0 on the right side and 1+ on the left side.       Dorsalis pedis pulses are 0 on the right side and 0 on the left side.       Posterior tibial pulses are 0 on the right side and 0 on the left side.  2 + right leg edema (vein harvest site) with mild inflammation without discharge.  Pulmonary/Chest: Effort normal and breath sounds normal. He has no wheezes. He has no rales.  Sternotomy scar noted  Musculoskeletal:     Cervical back: Neck supple.   Radiology: No results found.  Laboratory examination:   CMP Latest Ref Rng & Units 05/24/2019 05/23/2019 05/22/2019  Glucose 70 - 99 mg/dL  94 141(H) 124(H)  BUN 8 - 23 mg/dL 13 14 17   Creatinine 0.61 - 1.24 mg/dL 1.22 1.13 1.17  Sodium 135 - 145 mmol/L 143 140 142  Potassium 3.5 - 5.1 mmol/L 3.8 3.8 3.8  Chloride 98 - 111 mmol/L 112(H) 111 112(H)  CO2 22 - 32 mmol/L 22 20(L) 24  Calcium 8.9 - 10.3 mg/dL 8.3(L) 8.5(L) 8.4(L)  Total Protein 6.5 - 8.1 g/dL - - -  Total Bilirubin 0.3 - 1.2 mg/dL - - -  Alkaline Phos 38 - 126 U/L - - -  AST 15 - 41 U/L - - -  ALT 0 - 44 U/L - - -   CBC Latest Ref Rng & Units 05/23/2019 05/22/2019 05/21/2019  WBC 4.0 - 10.5 K/uL 9.4 10.2 11.5(H)  Hemoglobin 13.0 - 17.0 g/dL 8.1(L) 7.7(L) 8.2(L)  Hematocrit 39.0 - 52.0 % 26.4(L) 24.6(L) 26.5(L)  Platelets 150 - 400 K/uL 544(H) 472(H) 457(H)   Lipid Panel     Component Value Date/Time   CHOL 124 05/14/2019 1433   CHOL 130 09/03/2012 1629   TRIG 41 05/14/2019 1433   TRIG 60 08/27/2013 1056   TRIG 66 09/03/2012 1629   HDL 64 05/14/2019 1433   HDL 47 08/27/2013 1056   HDL 46 09/03/2012 1629   CHOLHDL 2.6 03/20/2018 0516   VLDL 6 03/20/2018 0516   LDLCALC 50 05/14/2019 1433   LDLCALC 67 08/27/2013 1056   LDLCALC 71 09/03/2012 1629   HEMOGLOBIN A1C Lab Results  Component Value Date   HGBA1C 4.9 05/19/2019   MPG 93.93 05/19/2019   TSH Recent Labs     05/14/19 1433  TSH 1.530   Medications   Current Outpatient Medications  Medication Instructions  . acetaminophen (TYLENOL) 650 mg, Oral, Every 6 hours PRN  . aspirin EC 81 mg, Oral,  Every morning - 10a  . atorvastatin (LIPITOR) 20 mg, Oral, Daily  . clopidogrel (PLAVIX) 75 mg, Oral, Daily  . donepezil (ARICEPT) 10 mg, Oral, Daily at bedtime  . metoprolol succinate (TOPROL-XL) 50 mg, Oral, Daily, Take with or immediately following a meal.  . Multiple Vitamin (MULTIVITAMIN WITH MINERALS) TABS tablet 1 tablet, Oral, Daily  . nitroGLYCERIN (NITROSTAT) 0.4 MG SL tablet DISSOLVE 1 TABLET UNDER TONGUE EVERY 5 MIN AS NEEDED FOR CHEST PAIN, MAX 3 DOSES IN 15 MIN  . OLANZapine zydis (ZYPREXA) 5 mg, Oral, Daily at bedtime  . Polyethyl Glycol-Propyl Glycol (SYSTANE OP) 1 drop, Both Eyes, Daily  . tamsulosin (FLOMAX) 0.4 mg, Oral, Daily   Cardiac Studies:   Hospital Lexiscan Myoview stress test 03/20/2018:  There was no ST segment deviation noted during stress.   Findings consistent with prior inferior/basal inferoseptal/inferoapical myocardial infarction. There is no current ischemia. This is a low to intermediate risk study based on prior scar and mildly decreased LVEF, there is no myocardium currently at jeopardy. The left ventricular ejection fraction is mildly decreased (48%).  Left Heart Catheterization 05/18/19:  Normal LVEDP.  LV gram although performed is nondiagnostic due to frequent PVCs. Left main: Ulcerated stenosis of 20 to 30% initially, progressed to at least 50 to 60% at the end of diagnostic angiography with dissection plane clearly evident. LAD: Ostial LAD with a 80 to 90% stenosis, calcific.  Diffuse disease noted in the mid to distal LAD.  Previously placed stent in the mid LAD in 2012: 2.25x22 Resolute) widely patent. Circumflex: Mid circumflex stent (3.5x45mm Promus stent 04/26/14) widely patent.  Dominant vessel.  Mild diffuse disease.  OM1 is  fairly large, ostial 90%  stenosis. RI: Large vessel, with multiple branches.  Again calcified.  Ostium has a 90% stenosis.  Recommendation: Patient has unstable plaque in the left main.  This was exacerbated by angiography however remained stable without chest pain, no EKG abnormality, normal hemodynamics.  We will admit the patient to the hospital in view of high risk for ACS and potential for sudden cardiac death.  He will be evaluated for urgent CABG. He is presently on aspirin and also moderate dose of cilostazol 50 mg twice daily.  This will certainly cause bleeding risk.  Carotid artery duplex 05/18/2019: Right Carotid: Velocities in the right ICA are consistent with a 1-39% stenosis.   Left Carotid: Velocities in the left ICA are consistent with a 1-39% stenosis. Vertebrals: Bilateral vertebral arteries demonstrate antegrade flow.  ABI 05/18/2019: Right ABI: Resting right ankle-brachial index indicates moderate right lower extremity arterial disease. Right PTA indicated normal limits, Right DP indicates moderate PAD. Left ABI: Resting left ankle-brachial index is within normal range. No evidence of significant left lower extremity arterial disease. Right   Rt Pressure (mmHg)IndexWaveform Comment  +--------+------------------+-----+---------+--------+ Brachial                       triphasic         +--------+------------------+-----+---------+--------+ PTA     157               1.05 biphasic          +--------+------------------+-----+---------+--------+ DP      106               0.71 biphasic          +--------+------------------+-----+---------+--------+   +--------+------------------+-----+---------+-------+ Left    Lt Pressure (mmHg)IndexWaveform Comment +--------+------------------+-----+---------+-------+ [id number]                    triphasic        +--------+------------------+-----+---------+-------+ PTA     180               1.21 biphasic          +--------+------------------+-----+---------+-------+ DP      172               1.15 biphasic         +--------+------------------+-----+---------+-------+  Assessment     ICD-10-CM   1. Coronary artery disease of native artery of native heart with stable angina pectoris (HCC)  I25.118 EKG 12-Lead    metoprolol succinate (TOPROL-XL) 50 MG 24 hr tablet    clopidogrel (PLAVIX) 75 MG tablet    CBC    CBC  2. S/P CABG x 4 LIMA to LAD, SVG to D1, SVG to OM 1 and SVG to RI 05/19/2019  Z95.1   3. Essential hypertension  I10 metoprolol succinate (TOPROL-XL) 50 MG 24 hr tablet    BASIC METABOLIC PANEL WITH GFR    BASIC METABOLIC PANEL WITH GFR  4. Claudication in peripheral vascular disease ([place])  I73.9    EKG 06/02/2019: Normal sinus rhythm at rate of 77 bpm, left axis deviation, left anterior fascicular block.  Incomplete right bundle branch block.  T wave abnormality, cannot exclude lateral ischemia. Low voltage.   EKG 05/20/2019: Normal sinus rhythm at rate of 86 bpm, left axis deviation, left intravascular block.  ST elevation in V1 to V5, consider early repolarization, cannot exclude anterior injury pattern.  EKG 05/14/2019: Normal sinus rhythm at rate of 72 bpm, left atrial enlargement, normal  axis.  Incomplete right bundle branch block.  Baseline artifact.  Nonspecific T abnormality.  No significant change from 01/06/2019.   Recommendations:   Joseph Byrd  is a 85 y.o. African-American male with coronary artery disease and angioplasty to his mid LAD in 2012 and to distal dominant circumflex coronary artery in 2015, hypertension, hyperlipidemia, symptoms suggestive mild PAD and claudication, He underwent coronary angiography on 05/18/2019 and was found to have significant ulcerated left main disease and also multivessel disease and was recommended CABG.  He underwent four-vessel bypass on 05/19/2019 by Dr. Melodie Bouillon.  Postoperative course was complicated by slow progress  and also agitation and sundowning.  He now presents here for follow-up, has done a remarkable recovery and essentially has returned to baseline activity.  Except for right leg edema from vein harvest site, he has no other significant findings.  Advised them to keep an eye on the redness in the vein harvest site and to inform us if there is any discharge. Mental status has completely cleared and no further sundowning.  Wife present.   We will increase metoprolol succinate to 50 mg daily in view of hypertension and also for CVA protection, I will start him on Plavix on 75 mg daily for at least 6 months to a year  In view of ACS like presentation and recent CABG.  He also has underlying PAD and thrombocytosis.  I'll obtain CBC and BMP in one month and see him back in 6 weeks.  Adrian Prows, MD, [facility] 06/02/2019, 2:22 PM [facility] [place] Office: [phone]  CC: Dr. Melodie Bouillon.

## 2019-06-08 ENCOUNTER — Encounter: Payer: Self-pay | Admitting: Thoracic Surgery (Cardiothoracic Vascular Surgery)

## 2019-06-08 ENCOUNTER — Ambulatory Visit (INDEPENDENT_AMBULATORY_CARE_PROVIDER_SITE_OTHER): Payer: Self-pay | Admitting: Thoracic Surgery (Cardiothoracic Vascular Surgery)

## 2019-06-08 ENCOUNTER — Other Ambulatory Visit: Payer: Self-pay

## 2019-06-08 VITALS — BP 160/82 | HR 84 | Temp 97.7°F | Resp 20 | Ht 69.0 in | Wt 182.0 lb

## 2019-06-08 DIAGNOSIS — I2510 Atherosclerotic heart disease of native coronary artery without angina pectoris: Secondary | ICD-10-CM

## 2019-06-08 DIAGNOSIS — Z951 Presence of aortocoronary bypass graft: Secondary | ICD-10-CM

## 2019-06-08 NOTE — Progress Notes (Signed)
      [address]       [place] [address]             [phone]        [facility] Medical Record [id number] Date of Birth: 10/25/1933  Referring: Adrian Prows, MD Primary Care: Dettinger, [name], MD Primary Cardiologist:Jay [name], MD  Reason for visit:   follow-up  History of Present Illness:     Doing well.  Ambulating.  Physical Exam: Ht 5\' 9"  (1.753 m)   BMI 26.52 kg/m   Alert NAD Incision clean.  Sternum stable Abdomen soft, ND Trace RLE peripheral edema       Assessment / Plan:   S/p CABG.  Hx of dementia Doing well Will f/u in 1 month with CXR   Lajuana Matte 06/08/2019 9:36 AM

## 2019-06-10 ENCOUNTER — Other Ambulatory Visit: Payer: Self-pay

## 2019-06-10 ENCOUNTER — Ambulatory Visit: Payer: Medicare HMO | Attending: Thoracic Surgery (Cardiothoracic Vascular Surgery) | Admitting: Physical Therapy

## 2019-06-10 ENCOUNTER — Encounter: Payer: Self-pay | Admitting: Physical Therapy

## 2019-06-10 DIAGNOSIS — M6281 Muscle weakness (generalized): Secondary | ICD-10-CM

## 2019-06-10 NOTE — Therapy (Signed)
[facility] [place], [place], [address] Phone: [phone]   Fax:  [phone]  Physical Therapy Treatment  Patient Details  Name: Joseph Byrd MRN: [id number] Date of Birth: 06/26/1933 Referring Provider (PT): Melodie Bouillon, MD   Encounter Date: 06/10/2019  PT End of Session - 06/10/19 1340    Visit Number  2    Number of Visits  8    Date for PT Re-Evaluation  08/03/19    Authorization Type  Progress note at 10th visit; kx modifier at 15th visit    PT Start Time  0100    PT Stop Time  0134    PT Time Calculation (min)  34 min    Activity Tolerance  Patient limited by fatigue;Patient tolerated treatment well    Behavior During Therapy  [facility] for tasks assessed/performed       Past Medical History:  Diagnosis Date  . Arthritis    "knees, wrists, knuckles" (04/26/2014)  . CAD (coronary artery disease)   . Headache    "had a couple/wk til ~ 2 wks ago" (04/26/2014)  . Hyperlipidemia   . Hypertension   . LBBB (left bundle branch block) 07/06/2018  . PAD (peripheral artery disease) ([place])   . S/Byrd CABG x 4 05/19/2019   LIMA to LAD, SVG to D1, SVG to OM 1 and SVG to RI 05/19/2019  . Type II diabetes mellitus ([place])     Past Surgical History:  Procedure Laterality Date  . BACK SURGERY    . CATARACT EXTRACTION W/ INTRAOCULAR LENS  IMPLANT, BILATERAL Bilateral ~ 2014  . CORONARY ANGIOPLASTY WITH STENT PLACEMENT  08/2010; 04/26/2014   "1, LAD; 1"  . CORONARY ARTERY BYPASS GRAFT N/A 05/19/2019   Procedure: CORONARY ARTERY BYPASS GRAFTING (CABG), on pump, times four using left internal mammary artery and right greater saphenous vein harvested endoscopically;  Surgeon: Lajuana Matte, MD;  Location: [facility];  Service: Open Heart Surgery;  Laterality: N/A;  . LEFT HEART CATH AND CORONARY ANGIOGRAPHY N/A 05/18/2019   Procedure: LEFT HEART CATH AND CORONARY ANGIOGRAPHY;  Surgeon: Adrian Prows, MD;  Location: [facility]  CV LAB;  Service: Cardiovascular;  Laterality: N/A;  . LEFT HEART CATHETERIZATION WITH CORONARY ANGIOGRAM N/A 04/26/2014   Procedure: LEFT HEART CATHETERIZATION WITH CORONARY ANGIOGRAM;  Surgeon: Laverda Page, MD;  Location: [facility] CATH LAB;  Service: Cardiovascular;  Laterality: N/A;  . LUMBAR LAMINECTOMY/DECOMPRESSION MICRODISCECTOMY  01/2010    There were no vitals filed for this visit.  Subjective Assessment - 06/10/19 1300    Subjective  COVID-19 screening performed upon arrival. No complaints upon arrival    Pertinent History  CABG 05/19/2019, HTN, history of lumbar surgery    Limitations  House hold activities    How long can you walk comfortably?  short distances, 2 laps around the home with AD.    Patient Stated Goals  Get stronger    Currently in Pain?  No/denies                       [facility] Adult PT Treatment/Exercise - 06/10/19 0001      Ambulation/Gait   Ambulation/Gait  Yes    Ambulation Distance (Feet)  78 Feet    Assistive device  Straight cane    Gait Pattern  Step-through pattern;Decreased stride length    Ambulation Surface  Level;Indoor    Gait Comments  slow and required rest after      Exercises  Exercises  Knee/Hip;Lumbar      Knee/Hip Exercises: Aerobic   Nustep  L1 x7min LE only      Knee/Hip Exercises: Standing   Heel Raises  Both;10 reps    Hip Flexion  Stengthening;Knee bent;10 reps;Both    Hip Abduction  Stengthening;Both;10 reps;Knee straight      Knee/Hip Exercises: Seated   Long Arc Quad  Strengthening;Both;2 sets;10 reps;Weights    Long Arc Quad Weight  2 lbs.    Ball Squeeze  x 1 1/2 min    Other Seated Knee/Hip Exercises  clamshell w yellow t-band x15    Other Seated Knee/Hip Exercises  grey ball for reachouts half range 2x10    Marching  Strengthening;Both;2 sets;10 reps;Weights    Marching Limitations  2    Hamstring Curl  Strengthening;Both;15 reps    Hamstring Limitations  yellow tband                   PT Long Term Goals - 06/10/19 1309      PT LONG TERM GOAL #1   Title  Patient will be independent with HEP    Time  8    Period  Weeks    Status  On-going      PT LONG TERM GOAL #2   Title  Patient will report ability to perform ADLs and home activities with modfied BORG/ RPE scale no greater than 2-3/10.    Time  8    Period  Weeks    Status  On-going      PT LONG TERM GOAL #3   Title  Patient will improve functional strength as noted by the ability to perform modifiex 5x sit to stand in 11 seconds or less.    Time  8    Period  Weeks    Status  On-going      PT LONG TERM GOAL #4   Title  Patient will improve cardiovascular health as noted by the ability to ambulate 150 feet with AD during 2 minute walk test.    Time  8    Period  Weeks    Status  On-going            Plan - 06/10/19 1341    Clinical Impression Statement  Patient tolerated treatment well fair today due to deconditioned and faigue quickly. Today focused on gentle exercises and endurance activities with minimal reps to allow patient to recover from fatigue and in hope to prevent soreness due to inactivity. Patient BORG scale was at 4-6 today with exercises. Patient required several rest breaks due to fatigue. Patient vitals WNL today. Discussed with patient a goals of 5 min a day of minimal activity of walking and or initial HEP as given by PT to improve his endurance. Patient current goals ongoing at this time.    Personal Factors and Comorbidities  Age;Fitness;Comorbidity 2    Comorbidities  CABG 05/17/2019. HTN    Examination-Activity Limitations  Locomotion Level    Stability/Clinical Decision Making  Stable/Uncomplicated    Rehab Potential  Good    PT Frequency  1x / week    PT Duration  8 weeks    PT Treatment/Interventions  ADLs/Self Care Home Management;Gait training;Stair training;Functional mobility training;Therapeutic activities;Therapeutic exercise;Balance  training;Neuromuscular re-education;Energy conservation;Patient/family education;Manual techniques    PT Next Visit Plan  cont with POC and Please monitor vitals, nustep, gentle strengthening of LEs, UEs and core (3 weeks post op CABG)    Consulted and Agree with  Plan of Care  Patient       Patient will benefit from skilled therapeutic intervention in order to improve the following deficits and impairments:  Cardiopulmonary status limiting activity, Decreased activity tolerance, Decreased strength, Difficulty walking, Decreased balance  Visit Diagnosis: Muscle weakness (generalized)     Problem List Patient Active Problem List   Diagnosis Date Noted  . S/Byrd CABG x 4 05/19/2019  . Memory impairment 09/21/2018  . Claudication in peripheral vascular disease ([place]) 07/06/2018  . Angina pectoris ([place]) 03/19/2018  . Overweight (BMI 25.0-29.9) 12/18/2017  . Anemia 12/16/2016  . Vitamin D deficiency 08/30/2014  . Postsurgical percutaneous transluminal coronary angioplasty (PTCA) status 04/26/2014  . Angina pectoris associated with type 2 diabetes mellitus ([place]) 04/25/2014  . Cataract   . Arthritis of both knees 01/05/2013  . Hyperglycemia 01/05/2013  . Essential hypertension, benign 08/22/2010  . CAD, multiple vessel 08/22/2010  . Hyperlipidemia with target LDL less than 70 08/22/2010    Joseph Byrd, PTA 06/10/2019, 1:49 PM  [facility] [address] [place], [place], [address] Phone: [phone]   Fax:  [phone]  Name: Joseph Byrd MRN: [id number] Date of Birth: 11/12/1933

## 2019-06-11 ENCOUNTER — Ambulatory Visit: Payer: Medicare HMO | Admitting: Thoracic Surgery (Cardiothoracic Vascular Surgery)

## 2019-06-17 ENCOUNTER — Other Ambulatory Visit: Payer: Self-pay | Admitting: Physician Assistant

## 2019-06-17 ENCOUNTER — Telehealth (HOSPITAL_COMMUNITY): Payer: Self-pay | Admitting: *Deleted

## 2019-06-17 NOTE — Telephone Encounter (Signed)
Called patient today to discuss the Cardiac Rehab program. I spoke with his wife. They do not have a smarthone so she said she would like for [place] to call them back once the CR program reopens. We will call her back at that time,

## 2019-06-24 ENCOUNTER — Ambulatory Visit: Payer: Medicare HMO | Admitting: Physical Therapy

## 2019-06-26 ENCOUNTER — Other Ambulatory Visit: Payer: Self-pay | Admitting: Physician Assistant

## 2019-06-28 DIAGNOSIS — I25118 Atherosclerotic heart disease of native coronary artery with other forms of angina pectoris: Secondary | ICD-10-CM | POA: Diagnosis not present

## 2019-06-28 LAB — CBC
Hematocrit: 34.8 % — ABNORMAL LOW (ref 37.5–51.0)
Hemoglobin: 10.9 g/dL — ABNORMAL LOW (ref 13.0–17.7)
MCH: 27.9 pg (ref 26.6–33.0)
MCHC: 31.3 g/dL — ABNORMAL LOW (ref 31.5–35.7)
MCV: 89 fL (ref 79–97)
Platelets: 537 10*3/uL — ABNORMAL HIGH (ref 150–450)
RBC: 3.91 x10E6/uL — ABNORMAL LOW (ref 4.14–5.80)
RDW: 13.9 % (ref 11.6–15.4)
WBC: 6.4 10*3/uL (ref 3.4–10.8)

## 2019-06-30 ENCOUNTER — Other Ambulatory Visit: Payer: Self-pay | Admitting: Family Medicine

## 2019-07-01 MED FILL — Cefuroxime Sodium For Inj 750 MG: INTRAMUSCULAR | Qty: 750 | Status: AC

## 2019-07-06 ENCOUNTER — Other Ambulatory Visit: Payer: Self-pay | Admitting: Family Medicine

## 2019-07-06 DIAGNOSIS — E785 Hyperlipidemia, unspecified: Secondary | ICD-10-CM

## 2019-07-06 DIAGNOSIS — I2510 Atherosclerotic heart disease of native coronary artery without angina pectoris: Secondary | ICD-10-CM

## 2019-07-07 ENCOUNTER — Other Ambulatory Visit: Payer: Self-pay | Admitting: Thoracic Surgery (Cardiothoracic Vascular Surgery)

## 2019-07-07 DIAGNOSIS — Z951 Presence of aortocoronary bypass graft: Secondary | ICD-10-CM

## 2019-07-09 ENCOUNTER — Encounter: Payer: Self-pay | Admitting: Thoracic Surgery (Cardiothoracic Vascular Surgery)

## 2019-07-09 ENCOUNTER — Ambulatory Visit (INDEPENDENT_AMBULATORY_CARE_PROVIDER_SITE_OTHER): Payer: Self-pay | Admitting: Thoracic Surgery (Cardiothoracic Vascular Surgery)

## 2019-07-09 ENCOUNTER — Ambulatory Visit
Admission: RE | Admit: 2019-07-09 | Discharge: 2019-07-09 | Disposition: A | Discharge status: Home / Self Care | Payer: Medicare HMO | Source: Ambulatory Visit | Attending: Thoracic Surgery (Cardiothoracic Vascular Surgery) | Admitting: Thoracic Surgery (Cardiothoracic Vascular Surgery)

## 2019-07-09 ENCOUNTER — Other Ambulatory Visit: Payer: Self-pay

## 2019-07-09 VITALS — BP 155/93 | HR 79 | Temp 98.1°F | Resp 20 | Ht 69.0 in | Wt 169.5 lb

## 2019-07-09 DIAGNOSIS — J90 Pleural effusion, not elsewhere classified: Secondary | ICD-10-CM | POA: Diagnosis not present

## 2019-07-09 DIAGNOSIS — Z951 Presence of aortocoronary bypass graft: Secondary | ICD-10-CM

## 2019-07-09 DIAGNOSIS — I2510 Atherosclerotic heart disease of native coronary artery without angina pectoris: Secondary | ICD-10-CM

## 2019-07-09 NOTE — Progress Notes (Signed)
      [address]       [place] [address]             [phone]        Kelyn Erno [place] Medical Record [id number] Date of Birth: 04/08/1934  Referring: Adrian Prows, MD Primary Care: Dettinger, [name], MD Primary Cardiologist:Jay [name], MD  Reason for visit:   follow-up  History of Present Illness:     Mr. Mountain comes in for his final post-op visit.  He has no complaints.  He has been ambulating without issue.  Physical Exam: BP (!) 155/93 (BP Location: Right Arm)   Pulse 79   Temp 98.1 F (36.7 C) (Skin)   Resp 20   Ht 5\' 9"  (1.753 m)   Wt 169 lb 8 oz (76.9 kg)   SpO2 96% Comment: RA  BMI 25.03 kg/m   Alert NAD Incision Clean.  Sternum stable Abdomen soft, ND no peripheral edema   Diagnostic Studies & Laboratory data: CXR: clear   Assessment / Plan:   S/p CABG Doing well clear for cardiac rehab F/u prn   Lajuana Matte 07/09/2019 10:35 AM

## 2019-07-12 ENCOUNTER — Other Ambulatory Visit: Payer: Self-pay

## 2019-07-12 ENCOUNTER — Encounter: Payer: Self-pay | Admitting: Cardiology

## 2019-07-12 ENCOUNTER — Ambulatory Visit: Payer: Medicare HMO | Admitting: Cardiology

## 2019-07-12 VITALS — BP 155/100 | HR 88 | Temp 97.1°F | Resp 16 | Ht 69.0 in | Wt 163.0 lb

## 2019-07-12 DIAGNOSIS — Z951 Presence of aortocoronary bypass graft: Secondary | ICD-10-CM

## 2019-07-12 DIAGNOSIS — I10 Essential (primary) hypertension: Secondary | ICD-10-CM

## 2019-07-12 DIAGNOSIS — I25118 Atherosclerotic heart disease of native coronary artery with other forms of angina pectoris: Secondary | ICD-10-CM

## 2019-07-12 MED ORDER — AMLODIPINE-VALSARTAN-HCTZ 5-160-25 MG PO TABS: 1.0000 | ORAL_TABLET | ORAL | 2 refills | Status: DC

## 2019-07-12 NOTE — Patient Instructions (Signed)
Monitor your blood pressure, it should be 130/80 mmHg, if persistently higher please contact us.

## 2019-07-12 NOTE — Progress Notes (Signed)
Primary Physician/Referring:  Dettinger, [name], MD  Patient ID: Joseph Byrd, male    DOB: [birth year], 86 y.o.   MRN: [id number]  Chief Complaint  Patient presents with  . Coronary Artery Disease  . Hypertension  . Results    Labs   HPI:    HPI: Joseph Byrd  is a 86 y.o. African-American male with coronary artery disease and angioplasty to his mid LAD in 2012 and to distal dominant circumflex coronary artery in 2015, hypertension, hyperlipidemia, symptoms suggestive mild PAD and claudication. He underwent four-vessel bypass on 05/19/2019 due to progression of CAD.   He has recuperated well, is walking and ambulating without any limitations.  Has mild dyspnea on exertion, denies claudication.   Past Medical History:  Diagnosis Date  . Arthritis    "knees, wrists, knuckles" (04/26/2014)  . CAD (coronary artery disease)   . Headache    "had a couple/wk til ~ 2 wks ago" (04/26/2014)  . Hyperlipidemia   . Hypertension   . LBBB (left bundle branch block) 07/06/2018  . PAD (peripheral artery disease) ([place])   . 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 Type II diabetes mellitus ([place])    Past Surgical History:  Procedure Laterality Date  . BACK SURGERY    . CATARACT EXTRACTION W/ INTRAOCULAR LENS  IMPLANT, BILATERAL Bilateral ~ 2014  . CORONARY ANGIOPLASTY WITH STENT PLACEMENT  08/2010; 04/26/2014   "1, LAD; 1"  . CORONARY ARTERY BYPASS GRAFT N/A 05/19/2019   Procedure: CORONARY ARTERY BYPASS GRAFTING (CABG), on pump, times four using left internal mammary artery and right greater saphenous vein harvested endoscopically;  Surgeon: Lajuana Matte, MD;  Location: [facility];  Service: Open Heart Surgery;  Laterality: N/A;  . LEFT HEART CATH AND CORONARY ANGIOGRAPHY N/A 05/18/2019   Procedure: LEFT HEART CATH AND CORONARY ANGIOGRAPHY;  Surgeon: Adrian Prows, MD;  Location: [facility];  Service: Cardiovascular;   Laterality: N/A;  . LEFT HEART CATHETERIZATION WITH CORONARY ANGIOGRAM N/A 04/26/2014   Procedure: LEFT HEART CATHETERIZATION WITH CORONARY ANGIOGRAM;  Surgeon: Laverda Page, MD;  Location: [facility] CATH LAB;  Service: Cardiovascular;  Laterality: N/A;  . LUMBAR LAMINECTOMY/DECOMPRESSION MICRODISCECTOMY  01/2010   Social History   Tobacco Use  . Smoking status: Former Smoker    Packs/day: 0.12    Years: 10.00    Pack years: 1.20    Types: Cigarettes    Quit date: 09/04/1982    Years since quitting: 36.8  . Smokeless tobacco: Never Used  Substance Use Topics  . Alcohol use: No    ROS  Review of Systems  Cardiovascular: Positive for dyspnea on exertion and leg swelling.  Gastrointestinal: Negative for melena.   Objective  Blood pressure (!) 155/100, pulse 88, temperature (!) 97.1 F (36.2 C), temperature source Temporal, resp. rate 16, height 5\' 9"  (1.753 m), weight 163 lb (73.9 kg), SpO2 95 %. Body mass index is 24.07 kg/m.   Vitals with BMI 07/12/2019 07/12/2019 07/12/2019  Height 5\' 9"  - 5\' 9"   Weight 163 lbs - 163 lbs  BMI 123456 - 123456  Systolic [id number] 0000000  Diastolic 123XX123 [id number] 123456  Pulse 88 80 85    Physical Exam  Constitutional: He appears well-nourished.  Cardiovascular: S1 normal and S2 normal. Exam reveals no gallop.  Murmur heard.  Harsh early systolic murmur is present at the upper right sternal border and  apex. Pulses:      Carotid pulses are 2+ on the right side and 2+ on the left side.      Femoral pulses are 2+ on the right side and 2+ on the left side.      Popliteal pulses are 0 on the right side and 1+ on the left side.       Dorsalis pedis pulses are 0 on the right side and 0 on the left side.       Posterior tibial pulses are 0 on the right side and 0 on the left side.  2 + right leg edema (vein harvest site) with mild inflammation without discharge.  Pulmonary/Chest: Effort normal and breath sounds normal. He has no wheezes. He has no rales.  Sternotomy scar  noted  Abdominal: Soft. Bowel sounds are normal.   Radiology: No results found.  Laboratory examination:   CMP Latest Ref Rng & Units 05/24/2019 05/23/2019 05/22/2019  Glucose 70 - 99 mg/dL 94 141(H) 124(H)  BUN 8 - 23 mg/dL 13 14 17   Creatinine 0.61 - 1.24 mg/dL 1.22 1.13 1.17  Sodium 135 - 145 mmol/L 143 140 142  Potassium 3.5 - 5.1 mmol/L 3.8 3.8 3.8  Chloride 98 - 111 mmol/L 112(H) 111 112(H)  CO2 22 - 32 mmol/L 22 20(L) 24  Calcium 8.9 - 10.3 mg/dL 8.3(L) 8.5(L) 8.4(L)  Total Protein 6.5 - 8.1 g/dL - - -  Total Bilirubin 0.3 - 1.2 mg/dL - - -  Alkaline Phos 38 - 126 U/L - - -  AST 15 - 41 U/L - - -  ALT 0 - 44 U/L - - -   CBC Latest Ref Rng & Units 06/28/2019 05/23/2019 05/22/2019  WBC 3.4 - 10.8 x10E3/uL 6.4 9.4 10.2  Hemoglobin 13.0 - 17.7 g/dL 10.9(L) 8.1(L) 7.7(L)  Hematocrit 37.5 - 51.0 % 34.8(L) 26.4(L) 24.6(L)  Platelets 150 - 450 x10E3/uL 537(H) 544(H) 472(H)   Lipid Panel     Component Value Date/Time   CHOL 124 05/14/2019 1433   CHOL 130 09/03/2012 1629   TRIG 41 05/14/2019 1433   TRIG 60 08/27/2013 1056   TRIG 66 09/03/2012 1629   HDL 64 05/14/2019 1433   HDL 47 08/27/2013 1056   HDL 46 09/03/2012 1629   CHOLHDL 2.6 03/20/2018 0516   VLDL 6 03/20/2018 0516   LDLCALC 50 05/14/2019 1433   LDLCALC 67 08/27/2013 1056   LDLCALC 71 09/03/2012 1629   HEMOGLOBIN A1C Lab Results  Component Value Date   HGBA1C 4.9 05/19/2019   MPG 93.93 05/19/2019   TSH Recent Labs    05/14/19 1433  TSH 1.530   Medications   Current Outpatient Medications  Medication Instructions  . acetaminophen (TYLENOL) 650 mg, Oral, Every 6 hours PRN  . amLODIPine-Valsartan-HCTZ (EXFORGE HCT) 5-160-25 MG TABS 1 tablet, Oral, BH-each morning  . aspirin EC 81 mg, Oral,  Every morning - 10a  . atorvastatin (LIPITOR) 20 MG tablet TAKE 1 TABLET BY MOUTH EVERY DAY  . clopidogrel (PLAVIX) 75 mg, Oral, Daily  . donepezil (ARICEPT) 10 mg, Oral, Daily at bedtime  . metoprolol  succinate (TOPROL-XL) 50 mg, Oral, Daily, Take with or immediately following a meal.  . Multiple Vitamin (MULTIVITAMIN WITH MINERALS) TABS tablet 1 tablet, Oral, Daily  . nitroGLYCERIN (NITROSTAT) 0.4 MG SL tablet DISSOLVE 1 TABLET UNDER TONGUE EVERY 5 MIN AS NEEDED FOR CHEST PAIN, MAX 3 DOSES IN 15 MIN  . OLANZapine zydis (ZYPREXA) 5 MG disintegrating tablet TAKE 1 TABLET  BY MOUTH EVERYDAY AT BEDTIME  . ONETOUCH VERIO test strip TEST BLOOD SUGAR DAILY DX E11.9  . Polyethyl Glycol-Propyl Glycol (SYSTANE OP) 1 drop, Both Eyes, Daily  . tamsulosin (FLOMAX) 0.4 mg, Oral, Daily   Cardiac Studies:   Hospital Lexiscan Myoview stress test 03/20/2018:  There was no ST segment deviation noted during stress.   Findings consistent with prior inferior/basal inferoseptal/inferoapical myocardial infarction. There is no current ischemia. This is a low to intermediate risk study based on prior scar and mildly decreased LVEF, there is no myocardium currently at jeopardy. The left ventricular ejection fraction is mildly decreased (48%).  Left Heart Catheterization 05/18/19:  Normal LVEDP.  LV gram although performed is nondiagnostic due to frequent PVCs. Left main: Ulcerated stenosis of 20 to 30% initially, progressed to at least 50 to 60% at the end of diagnostic angiography with dissection plane clearly evident. LAD: Ostial LAD with a 80 to 90% stenosis, calcific.  Diffuse disease noted in the mid to distal LAD.  Previously placed stent in the mid LAD in 2012: 2.25x22 Resolute) widely patent. Circumflex: Mid circumflex stent (3.5x41mm Promus stent 04/26/14) widely patent.  Dominant vessel.  Mild diffuse disease.  OM1 is fairly large, ostial 90% stenosis. RI: Large vessel, with multiple branches.  Again calcified.  Ostium has a 90% stenosis.  Recommendation: Patient has unstable plaque in the left main.  This was exacerbated by angiography however remained stable without chest pain, no EKG abnormality,  normal hemodynamics.  We will admit the patient to the hospital in view of high risk for ACS and potential for sudden cardiac death.  He will be evaluated for urgent CABG. He is presently on aspirin and also moderate dose of cilostazol 50 mg twice daily.  This will certainly cause bleeding risk.  Carotid artery duplex 05/18/2019: Right Carotid: Velocities in the right ICA are consistent with a 1-39% stenosis.   Left Carotid: Velocities in the left ICA are consistent with a 1-39% stenosis. Vertebrals: Bilateral vertebral arteries demonstrate antegrade flow.  ABI 05/18/2019: Right ABI: Resting right ankle-brachial index indicates moderate right lower extremity arterial disease. Right PTA indicated normal limits, Right DP indicates moderate PAD. Left ABI: Resting left ankle-brachial index is within normal range. No evidence of significant left lower extremity arterial disease. Right   Rt Pressure (mmHg)IndexWaveform Comment  +--------+------------------+-----+---------+--------+ Brachial                       triphasic         +--------+------------------+-----+---------+--------+ PTA     157               1.05 biphasic          +--------+------------------+-----+---------+--------+ DP      106               0.71 biphasic          +--------+------------------+-----+---------+--------+   +--------+------------------+-----+---------+-------+ Left    Lt Pressure (mmHg)IndexWaveform Comment +--------+------------------+-----+---------+-------+ [id number]                    triphasic        +--------+------------------+-----+---------+-------+ PTA     180               1.21 biphasic         +--------+------------------+-----+---------+-------+ DP      172               1.15 biphasic         +--------+------------------+-----+---------+-------+  Assessment     ICD-10-CM   1. Coronary artery disease of native artery of native heart with stable  angina pectoris ([place])  I25.118   2. S/P CABG x 4 LIMA to LAD, SVG to D1, SVG to OM 1 and SVG to RI 05/19/2019  Z95.1   3. Essential hypertension  I10 amLODIPine-Valsartan-HCTZ (EXFORGE HCT) 5-160-25 MG TABS    CBC    BASIC METABOLIC PANEL WITH GFR   EKG 06/02/2019: Normal sinus rhythm at rate of 77 bpm, left axis deviation, left anterior fascicular block.  Incomplete right bundle branch block.  T wave abnormality, cannot exclude lateral ischemia. Low voltage.   EKG 05/20/2019: Normal sinus rhythm at rate of 86 bpm, left axis deviation, left intravascular block.  ST elevation in V1 to V5, consider early repolarization, cannot exclude anterior injury pattern.  EKG 05/14/2019: Normal sinus rhythm at rate of 72 bpm, left atrial enlargement, normal axis.  Incomplete right bundle branch block.  Baseline artifact.  Nonspecific T abnormality.  No significant change from 01/06/2019.   Recommendations:   Joseph Byrd  is a 86 y.o. African-American male with coronary artery disease and angioplasty to his mid LAD in 2012 and to distal dominant circumflex coronary artery in 2015, hypertension, hyperlipidemia, symptoms suggestive mild PAD and claudication. He underwent four-vessel bypass on 05/19/2019 due to progression of CAD.  He has recuperated well from bypass surgery and has resumed all his physical activity.  He is here on a 6-week office visit and follow-up of hyperlipidemia and also on his last office visit I started him on Plavix which he is tolerating.  I reviewed his labs, renal function is stable and his CBC is improving.  Lipids are normal.    Blood pressure is markedly elevated, previously was on Exforge HCT, I restarted the medication, he will obtain BMP in 2 weeks.  Advised to continue to monitor his blood pressure, goal blood pressure is 130/80 mmHg.  Unless problems and see back in 6 months.  Adrian Prows, MD, [facility] 07/12/2019, 2:26 PM [facility] [place] Office: [phone]

## 2019-07-15 ENCOUNTER — Other Ambulatory Visit: Payer: Self-pay

## 2019-07-15 ENCOUNTER — Encounter: Payer: Self-pay | Admitting: Physical Therapy

## 2019-07-15 ENCOUNTER — Ambulatory Visit: Payer: Medicare HMO | Attending: Thoracic Surgery (Cardiothoracic Vascular Surgery) | Admitting: Physical Therapy

## 2019-07-15 DIAGNOSIS — M6281 Muscle weakness (generalized): Secondary | ICD-10-CM | POA: Diagnosis not present

## 2019-07-15 NOTE — Therapy (Signed)
[facility] [place], [place], [address] Phone: [phone]   Fax:  [phone]  Physical Therapy Treatment  Patient Details  Name: Joseph Byrd MRN: [id number] Date of Birth: [birth year] Referring Provider (PT): Melodie Bouillon, MD   Encounter Date: 07/15/2019  PT End of Session - 07/15/19 1333    Visit Number  3    Number of Visits  8    Date for PT Re-Evaluation  08/03/19    Authorization Type  Progress note at 10th visit; kx modifier at 15th visit    PT Start Time  0100    PT Stop Time  0133    PT Time Calculation (min)  33 min    Activity Tolerance  Patient limited by fatigue;Patient tolerated treatment well    Behavior During Therapy  [facility] for tasks assessed/performed       Past Medical History:  Diagnosis Date  . Arthritis    "knees, wrists, knuckles" (04/26/2014)  . CAD (coronary artery disease)   . Headache    "had a couple/wk til ~ 2 wks ago" (04/26/2014)  . Hyperlipidemia   . Hypertension   . LBBB (left bundle branch block) 07/06/2018  . PAD (peripheral artery disease) ([place])   . 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 Type II diabetes mellitus ([place])     Past Surgical History:  Procedure Laterality Date  . BACK SURGERY    . CATARACT EXTRACTION W/ INTRAOCULAR LENS  IMPLANT, BILATERAL Bilateral ~ 2014  . CORONARY ANGIOPLASTY WITH STENT PLACEMENT  08/2010; 04/26/2014   "1, LAD; 1"  . CORONARY ARTERY BYPASS GRAFT N/A 05/19/2019   Procedure: CORONARY ARTERY BYPASS GRAFTING (CABG), on pump, times four using left internal mammary artery and right greater saphenous vein harvested endoscopically;  Surgeon: Lajuana Matte, MD;  Location: [facility];  Service: Open Heart Surgery;  Laterality: N/A;  . LEFT HEART CATH AND CORONARY ANGIOGRAPHY N/A 05/18/2019   Procedure: LEFT HEART CATH AND CORONARY ANGIOGRAPHY;  Surgeon: Adrian Prows, MD;  Location: [facility]  [facility];  Service: Cardiovascular;  Laterality: N/A;  . LEFT HEART CATHETERIZATION WITH CORONARY ANGIOGRAM N/A 04/26/2014   Procedure: LEFT HEART CATHETERIZATION WITH CORONARY ANGIOGRAM;  Surgeon: Laverda Page, MD;  Location: [facility] CATH LAB;  Service: Cardiovascular;  Laterality: N/A;  . LUMBAR LAMINECTOMY/DECOMPRESSION MICRODISCECTOMY  01/2010    There were no vitals filed for this visit.  Subjective Assessment - 07/15/19 1302    Subjective  COVID-19 screening performed upon arrival. Patient arrived with no new complaints today.    Pertinent History  CABG 05/19/2019, HTN, history of lumbar surgery    Limitations  House hold activities    How long can you walk comfortably?  short distances, 2 laps around the home with AD.    Patient Stated Goals  Get stronger    Currently in Pain?  No/denies                       [facility] Adult PT Treatment/Exercise - 07/15/19 0001      Knee/Hip Exercises: Aerobic   Nustep  L1 x 6 min UE/LE activity      Knee/Hip Exercises: Standing   Heel Raises  Both;2 sets;10 reps    Hip Flexion  Stengthening;Both;15 reps;Knee bent    Other Standing Knee Exercises  standing on airex for balance enurance 36min 22 seconds with  int UE support      Knee/Hip Exercises: Seated   Long Arc Quad  Strengthening;Both;2 sets;10 reps;Weights    Long Arc Quad Weight  2 lbs.    Ball Squeeze  3sec hold 1 1/2    Other Seated Knee/Hip Exercises  clamshell w yellow t-band x15    Marching  Strengthening;Both;2 sets;10 reps;Weights    Marching Limitations  2    Hamstring Curl  Strengthening;Both;20 reps   Bil LE   Hamstring Limitations  yellow tband    Sit to Sand  5 reps;1 set;with UE support      Knee/Hip Exercises: Supine   Short Arc Quad Sets  Strengthening;Both;2 sets;10 reps    Short Arc Quad Sets Limitations  2#    Straight Leg Raises  Strengthening;Both;1 set;10 reps                  PT Long Term Goals - 07/15/19 1307      PT LONG TERM  GOAL #1   Title  Patient will be independent with HEP    Time  8    Period  Weeks    Status  On-going      PT LONG TERM GOAL #2   Title  Patient will report ability to perform ADLs and home activities with modfied BORG/ RPE scale no greater than 2-3/10.    Time  8    Period  Weeks    Status  On-going   5 BORG/RPE scale today 07/15/19     PT LONG TERM GOAL #3   Title  Patient will improve functional strength as noted by the ability to perform modifiex 5x sit to stand in 11 seconds or less.    Time  8    Period  Weeks    Status  On-going   x5 in 13 seconds 07/15/19     PT LONG TERM GOAL #4   Title  Patient will improve cardiovascular health as noted by the ability to ambulate 150 feet with AD during 2 minute walk test.    Time  8    Period  Weeks    Status  On-going            Plan - 07/15/19 1334    Clinical Impression Statement  Patient tolerated treatment fair today due to ongoing fatigue with exercises. Patient only able to tolerate minimal exercises with BORG/RPE scale at 5 today. Patient vitals WNL today yet required rest breaks. Patient gets fatigue with each exercises and minimal reps.Educational cues required for pace and technique to complete each exercise. Patient goals ongoing at this time.    Personal Factors and Comorbidities  Age;Fitness;Comorbidity 2    Comorbidities  CABG 05/17/2019. HTN    Examination-Activity Limitations  Locomotion Level    Stability/Clinical Decision Making  Stable/Uncomplicated    Rehab Potential  Good    PT Frequency  1x / week    PT Duration  8 weeks    PT Treatment/Interventions  ADLs/Self Care Home Management;Gait training;Stair training;Functional mobility training;Therapeutic activities;Therapeutic exercise;Balance training;Neuromuscular re-education;Energy conservation;Patient/family education;Manual techniques    PT Next Visit Plan  cont with POC and Please monitor vitals, nustep, gentle strengthening of LEs, UEs and core (7 weeks  post op CABG)    Consulted and Agree with Plan of Care  Patient       Patient will benefit from skilled therapeutic intervention in order to improve the following deficits and impairments:  Cardiopulmonary status limiting activity, Decreased activity tolerance, Decreased  strength, Difficulty walking, Decreased balance  Visit Diagnosis: Muscle weakness (generalized)     Problem List Patient Active Problem List   Diagnosis Date Noted  . 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 Memory impairment 09/21/2018  . Claudication in peripheral vascular disease ([place]) 07/06/2018  . Angina pectoris ([place]) 03/19/2018  . Overweight (BMI 25.0-29.9) 12/18/2017  . Anemia 12/16/2016  . Vitamin D deficiency 08/30/2014  . Postsurgical percutaneous transluminal coronary angioplasty (PTCA) status 04/26/2014  . Angina pectoris associated with type 2 diabetes mellitus ([place]) 04/25/2014  . Cataract   . Arthritis of both knees 01/05/2013  . Hyperglycemia 01/05/2013  . Essential hypertension, benign 08/22/2010  . CAD, multiple vessel 08/22/2010  . Hyperlipidemia with target LDL less than 70 08/22/2010    Carlisle Enke P, PTA 07/15/2019, 1:38 PM  [facility] [address] [place], [place], [address] Phone: [phone]   Fax:  [phone]  Name: Joseph Byrd MRN: [id number] Date of Birth: Sep 12, 1933

## 2019-07-16 ENCOUNTER — Telehealth: Payer: Self-pay

## 2019-07-16 NOTE — Telephone Encounter (Signed)
Pt's spouse called to advise that they cannot get Exforge. I called the pharmacy and they stated that it was not in stock and they had to order it but it still has not come in yet. They have the plain exforge 5-160 or she states that we can separate or please advise as to what you would like to do.//ah

## 2019-07-17 NOTE — Telephone Encounter (Signed)
I am okay to separate them. Amlddipine 5, Valsartan 160 and HCTZ 25

## 2019-07-19 NOTE — Telephone Encounter (Signed)
Pt and pharmacy aware of separating med.//ah

## 2019-07-25 ENCOUNTER — Other Ambulatory Visit: Payer: Self-pay | Admitting: Family Medicine

## 2019-07-26 ENCOUNTER — Other Ambulatory Visit: Payer: Self-pay | Admitting: Cardiology

## 2019-07-26 ENCOUNTER — Other Ambulatory Visit: Payer: Self-pay

## 2019-07-26 ENCOUNTER — Other Ambulatory Visit: Payer: Medicare HMO

## 2019-07-26 DIAGNOSIS — I10 Essential (primary) hypertension: Secondary | ICD-10-CM

## 2019-07-27 LAB — CBC WITH DIFFERENTIAL/PLATELET
Basophils Absolute: 0.1 10*3/uL (ref 0.0–0.2)
Basos: 1 %
EOS (ABSOLUTE): 0.3 10*3/uL (ref 0.0–0.4)
Eos: 6 %
Hematocrit: 38.3 % (ref 37.5–51.0)
Hemoglobin: 11.8 g/dL — ABNORMAL LOW (ref 13.0–17.7)
Immature Grans (Abs): 0 10*3/uL (ref 0.0–0.1)
Immature Granulocytes: 0 %
Lymphocytes Absolute: 1.3 10*3/uL (ref 0.7–3.1)
Lymphs: 23 %
MCH: 25.9 pg — ABNORMAL LOW (ref 26.6–33.0)
MCHC: 30.8 g/dL — ABNORMAL LOW (ref 31.5–35.7)
MCV: 84 fL (ref 79–97)
Monocytes Absolute: 0.4 10*3/uL (ref 0.1–0.9)
Monocytes: 7 %
Neutrophils Absolute: 3.6 10*3/uL (ref 1.4–7.0)
Neutrophils: 63 %
Platelets: 548 10*3/uL — ABNORMAL HIGH (ref 150–450)
RBC: 4.56 x10E6/uL (ref 4.14–5.80)
RDW: 14.8 % (ref 11.6–15.4)
WBC: 5.7 10*3/uL (ref 3.4–10.8)

## 2019-07-27 LAB — BASIC METABOLIC PANEL
BUN/Creatinine Ratio: 16 (ref 10–24)
BUN: 16 mg/dL (ref 8–27)
CO2: 23 mmol/L (ref 20–29)
Calcium: 9.5 mg/dL (ref 8.6–10.2)
Chloride: 106 mmol/L (ref 96–106)
Creatinine, Ser: 1.01 mg/dL (ref 0.76–1.27)
GFR calc Af Amer: 79 mL/min/{1.73_m2} (ref >59)
GFR calc non Af Amer: 68 mL/min/{1.73_m2} (ref >59)
Glucose: 119 mg/dL — ABNORMAL HIGH (ref 65–99)
Potassium: 3.9 mmol/L (ref 3.5–5.2)
Sodium: 143 mmol/L (ref 134–144)

## 2019-07-28 ENCOUNTER — Other Ambulatory Visit: Payer: Self-pay

## 2019-07-28 ENCOUNTER — Encounter: Payer: Self-pay | Admitting: Physical Therapy

## 2019-07-28 ENCOUNTER — Ambulatory Visit: Payer: Medicare HMO | Admitting: Physical Therapy

## 2019-07-28 DIAGNOSIS — M6281 Muscle weakness (generalized): Secondary | ICD-10-CM | POA: Diagnosis not present

## 2019-07-28 NOTE — Therapy (Signed)
[facility] [place], [place], [address] Phone: [phone]   Fax:  [phone]  Physical Therapy Treatment  Patient Details  Name: Joseph Byrd MRN: [id number] Date of Birth: [birth year] Referring Provider (PT): Melodie Bouillon, MD   Encounter Date: 07/28/2019  PT End of Session - 07/28/19 1322    Visit Number  4    Number of Visits  8    Date for PT Re-Evaluation  08/03/19    Authorization Type  Progress note at 10th visit; kx modifier at 15th visit    PT Start Time  1301    PT Stop Time  1327    PT Time Calculation (min)  26 min    Activity Tolerance  Patient limited by fatigue    Behavior During Therapy  [facility] for tasks assessed/performed       Past Medical History:  Diagnosis Date  . Arthritis    "knees, wrists, knuckles" (04/26/2014)  . CAD (coronary artery disease)   . Headache    "had a couple/wk til ~ 2 wks ago" (04/26/2014)  . Hyperlipidemia   . Hypertension   . LBBB (left bundle branch block) 07/06/2018  . PAD (peripheral artery disease) ([place])   . 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 Type II diabetes mellitus ([place])     Past Surgical History:  Procedure Laterality Date  . BACK SURGERY    . CATARACT EXTRACTION W/ INTRAOCULAR LENS  IMPLANT, BILATERAL Bilateral ~ 2014  . CORONARY ANGIOPLASTY WITH STENT PLACEMENT  08/2010; 04/26/2014   "1, LAD; 1"  . CORONARY ARTERY BYPASS GRAFT N/A 05/19/2019   Procedure: CORONARY ARTERY BYPASS GRAFTING (CABG), on pump, times four using left internal mammary artery and right greater saphenous vein harvested endoscopically;  Surgeon: Lajuana Matte, MD;  Location: [place];  Service: Open Heart Surgery;  Laterality: N/A;  . LEFT HEART CATH AND CORONARY ANGIOGRAPHY N/A 05/18/2019   Procedure: LEFT HEART CATH AND CORONARY ANGIOGRAPHY;  Surgeon: Adrian Prows, MD;  Location: [place] CV LAB;  Service: Cardiovascular;   Laterality: N/A;  . LEFT HEART CATHETERIZATION WITH CORONARY ANGIOGRAM N/A 04/26/2014   Procedure: LEFT HEART CATHETERIZATION WITH CORONARY ANGIOGRAM;  Surgeon: Laverda Page, MD;  Location: [facility] CATH LAB;  Service: Cardiovascular;  Laterality: N/A;  . LUMBAR LAMINECTOMY/DECOMPRESSION MICRODISCECTOMY  01/2010    There were no vitals filed for this visit.  Subjective Assessment - 07/28/19 1258    Subjective  COVID-19 screening performed upon arrival. Patient arrived with no new complaints today.    Pertinent History  CABG 05/19/2019, HTN, history of lumbar surgery    Limitations  House hold activities    How long can you walk comfortably?  short distances, 2 laps around the home with AD.    Patient Stated Goals  Get stronger    Currently in Pain?  No/denies         [facility] PT Assessment - 07/28/19 0001      Assessment   Medical Diagnosis  Weaknes    Referring Provider (PT)  Melodie Bouillon, MD    Onset Date/Surgical Date  05/19/19    Prior Therapy  no      Precautions   Precautions  Other (comment)    Precaution Comments  3 weeks post op CABG, vital assessment frequently  [facility] Adult PT Treatment/Exercise - 07/28/19 0001      Knee/Hip Exercises: Aerobic   Nustep  L4 x14 min with breaks due to fatigue      Knee/Hip Exercises: Seated   Long Arc Quad  Strengthening;Both;2 sets;10 reps;Weights    Long Arc Quad Weight  3 lbs.    Clamshell with TheraBand  Yellow   x10 reps   Hamstring Curl  Strengthening;Both;2 sets;10 reps;Limitations    Hamstring Limitations  yellow tband    Sit to Sand  5 reps;1 set;with UE support                  PT Long Term Goals - 07/15/19 1307      PT LONG TERM GOAL #1   Title  Patient will be independent with HEP    Time  8    Period  Weeks    Status  On-going      PT LONG TERM GOAL #2   Title  Patient will report ability to perform ADLs and home activities with modfied BORG/ RPE scale no greater  than 2-3/10.    Time  8    Period  Weeks    Status  On-going   5 BORG/RPE scale today 07/15/19     PT LONG TERM GOAL #3   Title  Patient will improve functional strength as noted by the ability to perform modifiex 5x sit to stand in 11 seconds or less.    Time  8    Period  Weeks    Status  On-going   x5 in 13 seconds 07/15/19     PT LONG TERM GOAL #4   Title  Patient will improve cardiovascular health as noted by the ability to ambulate 150 feet with AD during 2 minute walk test.    Time  8    Period  Weeks    Status  On-going            Plan - 07/28/19 1342    Clinical Impression Statement  Patient presented in clinic with no complaints upon arrival. Patient able to complete low level LE strengthening although limited by patient reported fatigue. Patient required frequent rest breaks on Nustep due to fatigue. Patient requested to limit today due to fatigue. B genu varus noted during gait with SPC but greater genu varus in R knee.    Personal Factors and Comorbidities  Age;Fitness;Comorbidity 2    Comorbidities  CABG 05/17/2019. HTN    Examination-Activity Limitations  Locomotion Level    Stability/Clinical Decision Making  Stable/Uncomplicated    Rehab Potential  Good    PT Frequency  1x / week    PT Duration  8 weeks    PT Treatment/Interventions  ADLs/Self Care Home Management;Gait training;Stair training;Functional mobility training;Therapeutic activities;Therapeutic exercise;Balance training;Neuromuscular re-education;Energy conservation;Patient/family education;Manual techniques    PT Next Visit Plan  cont with POC and Please monitor vitals, nustep, gentle strengthening of LEs, UEs and core (7 weeks post op CABG)    PT Home Exercise Plan  see patient education section    Consulted and Agree with Plan of Care  Patient       Patient will benefit from skilled therapeutic intervention in order to improve the following deficits and impairments:  Cardiopulmonary status  limiting activity, Decreased activity tolerance, Decreased strength, Difficulty walking, Decreased balance  Visit Diagnosis: Muscle weakness (generalized)     Problem List Patient Active Problem List   Diagnosis Date Noted  . S/P CABG x 4  05/19/2019  . Memory impairment 09/21/2018  . Claudication in peripheral vascular disease ([place]) 07/06/2018  . Angina pectoris ([place]) 03/19/2018  . Overweight (BMI 25.0-29.9) 12/18/2017  . Anemia 12/16/2016  . Vitamin D deficiency 08/30/2014  . Postsurgical percutaneous transluminal coronary angioplasty (PTCA) status 04/26/2014  . Angina pectoris associated with type 2 diabetes mellitus ([place]) 04/25/2014  . Cataract   . Arthritis of both knees 01/05/2013  . Hyperglycemia 01/05/2013  . Essential hypertension, benign 08/22/2010  . CAD, multiple vessel 08/22/2010  . Hyperlipidemia with target LDL less than 70 08/22/2010    Standley Brooking, PTA 07/28/2019, 1:46 PM  [facility] [address] [place], [place], [address] Phone: [phone]   Fax:  [phone]  Name: Joseph Byrd MRN: [id number] Date of Birth: February 19, 1934

## 2019-08-04 ENCOUNTER — Encounter: Payer: Self-pay | Admitting: Family Medicine

## 2019-08-04 ENCOUNTER — Ambulatory Visit (INDEPENDENT_AMBULATORY_CARE_PROVIDER_SITE_OTHER): Payer: Medicare HMO | Admitting: Family Medicine

## 2019-08-04 DIAGNOSIS — E785 Hyperlipidemia, unspecified: Secondary | ICD-10-CM | POA: Diagnosis not present

## 2019-08-04 DIAGNOSIS — I10 Essential (primary) hypertension: Secondary | ICD-10-CM | POA: Diagnosis not present

## 2019-08-04 NOTE — Progress Notes (Signed)
Virtual Visit via telephone Note  I connected with Joseph Byrd on 08/04/19 at 1040 by telephone and verified that I am speaking with the correct person using two identifiers. Joseph Byrd is currently located at home and no other people are currently with her during visit. The provider, [name] Dettinger, MD is located in their office at time of visit.  Call ended at 1048  I discussed the limitations, risks, security and privacy concerns of performing an evaluation and management service by telephone and the availability of in person appointments. I also discussed with the patient that there may be a patient responsible charge related to this service. The patient expressed understanding and agreed to proceed.   History and Present Illness: Hypertension Patient is currently on amlodipine-valsartan-hctz, and their blood pressure today is 120-130. Patient denies any lightheadedness or dizziness. Patient denies headaches, blurred vision, chest pains, shortness of breath, or weakness. Denies any side effects from medication and is content with current medication.   Hyperlipidemia Patient is coming in for recheck of his hyperlipidemia. The patient is currently taking atorvastatin. They deny any issues with myalgias or history of liver damage from it. They deny any focal numbness or weakness or chest pain.   Type 2 diabetes mellitus Patient comes in today for recheck of his diabetes. Patient has been currently taking no medication. Patient is currently on an ACE inhibitor/ARB. Patient has not seen an ophthalmologist this year. Patient denies any issues with their feet.   1. Hyperlipidemia with target LDL less than 70   2. Essential hypertension, benign     Outpatient Encounter Medications as of 08/04/2019  Medication Sig  . acetaminophen (TYLENOL) 325 MG tablet Take 650 mg by mouth every 6 (six) hours as needed for moderate pain or headache.  Marland Kitchen amLODIPine-Valsartan-HCTZ (EXFORGE HCT)  5-160-25 MG TABS Take 1 tablet by mouth every morning.  Marland Kitchen aspirin EC 81 MG tablet Take 81 mg by mouth every morning.  Marland Kitchen atorvastatin (LIPITOR) 20 MG tablet TAKE 1 TABLET BY MOUTH EVERY DAY  . clopidogrel (PLAVIX) 75 MG tablet Take 1 tablet (75 mg total) by mouth daily.  Marland Kitchen donepezil (ARICEPT) 10 MG tablet Take 10 mg by mouth at bedtime.   . metoprolol succinate (TOPROL-XL) 50 MG 24 hr tablet Take 1 tablet (50 mg total) by mouth daily. Take with or immediately following a meal.  . Multiple Vitamin (MULTIVITAMIN WITH MINERALS) TABS tablet Take 1 tablet by mouth daily.  . nitroGLYCERIN (NITROSTAT) 0.4 MG SL tablet DISSOLVE 1 TABLET UNDER TONGUE EVERY 5 MIN AS NEEDED FOR CHEST PAIN, MAX 3 DOSES IN 15 MIN (Patient taking differently: Place 0.4 mg under the tongue every 5 (five) minutes as needed for chest pain. )  . OLANZapine zydis (ZYPREXA) 5 MG disintegrating tablet TAKE 1 TABLET BY MOUTH EVERYDAY AT BEDTIME  . ONETOUCH VERIO test strip TEST BLOOD SUGAR DAILY DX E11.9  . Polyethyl Glycol-Propyl Glycol (SYSTANE OP) Place 1 drop into both eyes daily.  . tamsulosin (FLOMAX) 0.4 MG CAPS capsule Take 1 capsule (0.4 mg total) by mouth daily.   No facility-administered encounter medications on file as of 08/04/2019.    Review of Systems  Constitutional: Negative for chills and fever.  Eyes: Negative for visual disturbance.  Respiratory: Negative for shortness of breath and wheezing.   Cardiovascular: Negative for chest pain and leg swelling.  Musculoskeletal: Negative for back pain and gait problem.  Skin: Negative for rash.  Neurological: Negative for dizziness, weakness and light-headedness.  All other systems reviewed and are negative.   Observations/Objective: Patient sounds comfortable and in no acute distress  Assessment and Plan: Problem List Items Addressed This Visit      Cardiovascular and Mediastinum   Essential hypertension, benign     Other   Hyperlipidemia with target LDL less  than 70 - Primary      Blood work looks good from last week, seems to be doing well, will see back in 3 months, he will call for the appointment. Follow up plan: Return in about 3 months (around 11/04/2019), or if symptoms worsen or fail to improve, for Hypertension and cholesterol recheck.     I discussed the assessment and treatment plan with the patient. The patient was provided an opportunity to ask questions and all were answered. The patient agreed with the plan and demonstrated an understanding of the instructions.   The patient was advised to call back or seek an in-person evaluation if the symptoms worsen or if the condition fails to improve as anticipated.  The above assessment and management plan was discussed with the patient. The patient verbalized understanding of and has agreed to the management plan. Patient is aware to call the clinic if symptoms persist or worsen. Patient is aware when to return to the clinic for a follow-up visit. Patient educated on when it is appropriate to go to the emergency department.    I provided 8 minutes of non-face-to-face time during this encounter.    Worthy Rancher, MD

## 2019-08-10 ENCOUNTER — Other Ambulatory Visit: Payer: Self-pay | Admitting: Cardiology

## 2019-08-11 ENCOUNTER — Ambulatory Visit: Payer: Medicare HMO | Attending: Thoracic Surgery (Cardiothoracic Vascular Surgery) | Admitting: Physical Therapy

## 2019-08-11 ENCOUNTER — Other Ambulatory Visit: Payer: Self-pay

## 2019-08-11 ENCOUNTER — Encounter: Payer: Self-pay | Admitting: Physical Therapy

## 2019-08-11 DIAGNOSIS — M6281 Muscle weakness (generalized): Secondary | ICD-10-CM | POA: Insufficient documentation

## 2019-08-11 NOTE — Therapy (Signed)
[facility] [place], [place], [address] Phone: [phone]   Fax:  [phone]  Physical Therapy Treatment PHYSICAL THERAPY DISCHARGE SUMMARY  Visits from Start of Care: 5  Current functional level related to goals / functional outcomes: See below   Remaining deficits: See patient goals   Education / Equipment: HEP Plan: Patient agrees to discharge.  Patient goals were partially met. Patient is being discharged due to being pleased with the current functional level.  ?????     Patient Details  Name: Joseph Byrd MRN: [id number] Date of Birth: [birth year] Referring Provider (PT): Melodie Bouillon, MD   Encounter Date: 08/11/2019  PT End of Session - 08/11/19 1311    Visit Number  5    Number of Visits  8    Date for PT Re-Evaluation  08/03/19    Authorization Type  Progress note at 10th visit; kx modifier at 15th visit    PT Start Time  1301    PT Stop Time  1336    PT Time Calculation (min)  35 min    Equipment Utilized During Treatment  Gait belt    Activity Tolerance  Patient limited by fatigue    Behavior During Therapy  [facility] for tasks assessed/performed       Past Medical History:  Diagnosis Date  . Arthritis    "knees, wrists, knuckles" (04/26/2014)  . CAD (coronary artery disease)   . Headache    "had a couple/wk til ~ 2 wks ago" (04/26/2014)  . Hyperlipidemia   . Hypertension   . LBBB (left bundle branch block) 07/06/2018  . PAD (peripheral artery disease) ([place])   . 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 Type II diabetes mellitus ([place])     Past Surgical History:  Procedure Laterality Date  . BACK SURGERY    . CATARACT EXTRACTION W/ INTRAOCULAR LENS  IMPLANT, BILATERAL Bilateral ~ 2014  . CORONARY ANGIOPLASTY WITH STENT PLACEMENT  08/2010; 04/26/2014   "1, LAD; 1"  . CORONARY ARTERY BYPASS GRAFT N/A 05/19/2019   Procedure: CORONARY  ARTERY BYPASS GRAFTING (CABG), on pump, times four using left internal mammary artery and right greater saphenous vein harvested endoscopically;  Surgeon: Lajuana Matte, MD;  Location: [place];  Service: Open Heart Surgery;  Laterality: N/A;  . LEFT HEART CATH AND CORONARY ANGIOGRAPHY N/A 05/18/2019   Procedure: LEFT HEART CATH AND CORONARY ANGIOGRAPHY;  Surgeon: Adrian Prows, MD;  Location: [place] CV LAB;  Service: Cardiovascular;  Laterality: N/A;  . LEFT HEART CATHETERIZATION WITH CORONARY ANGIOGRAM N/A 04/26/2014   Procedure: LEFT HEART CATHETERIZATION WITH CORONARY ANGIOGRAM;  Surgeon: Laverda Page, MD;  Location: [facility] CATH LAB;  Service: Cardiovascular;  Laterality: N/A;  . LUMBAR LAMINECTOMY/DECOMPRESSION MICRODISCECTOMY  01/2010    There were no vitals filed for this visit.  Subjective Assessment - 08/11/19 1346    Subjective  COVID-19 screening performed upon arrival. Patient arrives feeling good and states he is able to do all his ADLs with minimal complaints.    Pertinent History  CABG 05/19/2019, HTN, history of lumbar surgery    Limitations  House hold activities    How long can you walk comfortably?  short distances, 2 laps around the home with AD.    Patient Stated Goals  Get stronger         [facility] PT Assessment - 08/11/19 0001  Assessment   Medical Diagnosis  Weakness    Referring Provider (PT)  Melodie Bouillon, MD    Onset Date/Surgical Date  05/19/19      Precautions   Precautions  Other (comment)    Precaution Comments  3 weeks post op CABG, vital assessment frequently      Transfers   Five time sit to stand comments   11 seconds with UE support      6 minute walk test results    Aerobic Endurance Distance Walked  227    Endurance additional comments  2 min walk test with [facility]                   [facility] Adult PT Treatment/Exercise - 08/11/19 0001      Knee/Hip Exercises: Aerobic   Nustep  L3 x12 min with breaks due to fatigue       Knee/Hip Exercises: Seated   Clamshell with TheraBand  Yellow   x2 minutes with rest breaks   Marching  Strengthening;Both;Other (comment)    Marching Limitations  yellow theraband x2 mins with rest    Sit to Sand  5 reps;1 set;with UE support                  PT Long Term Goals - 08/11/19 1324      PT LONG TERM GOAL #1   Title  Patient will be independent with HEP    Time  8    Period  Weeks    Status  Achieved      PT LONG TERM GOAL #2   Title  Patient will report ability to perform ADLs and home activities with modfied BORG/ RPE scale no greater than 2-3/10.    Time  8    Period  Weeks    Status  Partially Met      PT LONG TERM GOAL #3   Title  Patient will improve functional strength as noted by the ability to perform modified 5x sit to stand in 11 seconds or less.    Time  8    Period  Weeks    Status  --      PT LONG TERM GOAL #4   Title  Patient will improve cardiovascular health as noted by the ability to ambulate 150 feet with AD during 2 minute walk test.    Time  8    Period  Weeks    Status  Achieved   227 feet with AD           Plan - 08/11/19 1344    Clinical Impression Statement  Patient responded well to therapy session but with ongoing fatigue. Patient required intermittent rest breaks secondary to fatigue and was able to complete remaining reps for time. Patient reports significant functional improvements but ongoing fatigue. Patient is discharged with goals partially met.    Personal Factors and Comorbidities  Age;Fitness;Comorbidity 2    Comorbidities  CABG 05/17/2019. HTN    Examination-Activity Limitations  Locomotion Level    Stability/Clinical Decision Making  Stable/Uncomplicated    Clinical Decision Making  Low    Rehab Potential  Good    PT Frequency  1x / week    PT Duration  8 weeks    PT Treatment/Interventions  ADLs/Self Care Home Management;Gait training;Stair training;Functional mobility training;Therapeutic  activities;Therapeutic exercise;Balance training;Neuromuscular re-education;Energy conservation;Patient/family education;Manual techniques    PT Next Visit Plan  cont with POC and Please monitor vitals, nustep, gentle strengthening of LEs,  UEs and core (7 weeks post op CABG)    PT Home Exercise Plan  see patient education section    Consulted and Agree with Plan of Care  Patient       Patient will benefit from skilled therapeutic intervention in order to improve the following deficits and impairments:  Cardiopulmonary status limiting activity, Decreased activity tolerance, Decreased strength, Difficulty walking, Decreased balance  Visit Diagnosis: Muscle weakness (generalized)     Problem List Patient Active Problem List   Diagnosis Date Noted  . 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 Memory impairment 09/21/2018  . Claudication in peripheral vascular disease ([place]) 07/06/2018  . Angina pectoris ([place]) 03/19/2018  . Overweight (BMI 25.0-29.9) 12/18/2017  . Anemia 12/16/2016  . Vitamin D deficiency 08/30/2014  . Postsurgical percutaneous transluminal coronary angioplasty (PTCA) status 04/26/2014  . Angina pectoris associated with type 2 diabetes mellitus ([place]) 04/25/2014  . Cataract   . Arthritis of both knees 01/05/2013  . Hyperglycemia 01/05/2013  . Essential hypertension, benign 08/22/2010  . CAD, multiple vessel 08/22/2010  . Hyperlipidemia with target LDL less than 70 08/22/2010    Gabriela Eves, PT, DPT 08/11/2019, 2:01 PM  [facility] [address] [place], [place], [address] Phone: [phone]   Fax:  [phone]  Name: Joseph Byrd MRN: [id number] Date of Birth: 1934/01/06

## 2019-08-14 ENCOUNTER — Other Ambulatory Visit: Payer: Self-pay | Admitting: Physician Assistant

## 2019-08-19 DIAGNOSIS — G4700 Insomnia, unspecified: Secondary | ICD-10-CM | POA: Insufficient documentation

## 2019-08-19 DIAGNOSIS — F0150 Vascular dementia without behavioral disturbance: Secondary | ICD-10-CM | POA: Insufficient documentation

## 2019-08-19 DIAGNOSIS — R251 Tremor, unspecified: Secondary | ICD-10-CM | POA: Insufficient documentation

## 2019-08-19 DIAGNOSIS — M130 Polyarthritis, unspecified: Secondary | ICD-10-CM | POA: Insufficient documentation

## 2019-08-23 DIAGNOSIS — I10 Essential (primary) hypertension: Secondary | ICD-10-CM | POA: Diagnosis not present

## 2019-08-23 DIAGNOSIS — G309 Alzheimer's disease, unspecified: Secondary | ICD-10-CM | POA: Diagnosis not present

## 2019-08-23 DIAGNOSIS — R69 Illness, unspecified: Secondary | ICD-10-CM | POA: Diagnosis not present

## 2019-08-23 DIAGNOSIS — M130 Polyarthritis, unspecified: Secondary | ICD-10-CM | POA: Diagnosis not present

## 2019-08-27 ENCOUNTER — Other Ambulatory Visit: Payer: Self-pay | Admitting: Family Medicine

## 2019-09-17 ENCOUNTER — Other Ambulatory Visit: Payer: Self-pay | Admitting: Physician Assistant

## 2019-09-17 ENCOUNTER — Other Ambulatory Visit: Payer: Self-pay | Admitting: Family Medicine

## 2019-09-28 ENCOUNTER — Other Ambulatory Visit: Payer: Self-pay | Admitting: Family Medicine

## 2019-09-28 DIAGNOSIS — E785 Hyperlipidemia, unspecified: Secondary | ICD-10-CM

## 2019-09-28 DIAGNOSIS — I2510 Atherosclerotic heart disease of native coronary artery without angina pectoris: Secondary | ICD-10-CM

## 2019-10-12 ENCOUNTER — Other Ambulatory Visit: Payer: Self-pay | Admitting: Cardiology

## 2019-10-12 DIAGNOSIS — I10 Essential (primary) hypertension: Secondary | ICD-10-CM

## 2019-10-12 DIAGNOSIS — I25118 Atherosclerotic heart disease of native coronary artery with other forms of angina pectoris: Secondary | ICD-10-CM

## 2019-10-14 ENCOUNTER — Other Ambulatory Visit: Payer: Self-pay | Admitting: Family Medicine

## 2019-10-22 DIAGNOSIS — G309 Alzheimer's disease, unspecified: Secondary | ICD-10-CM | POA: Diagnosis not present

## 2019-10-22 DIAGNOSIS — I10 Essential (primary) hypertension: Secondary | ICD-10-CM | POA: Diagnosis not present

## 2019-10-22 DIAGNOSIS — N400 Enlarged prostate without lower urinary tract symptoms: Secondary | ICD-10-CM | POA: Diagnosis not present

## 2019-10-22 DIAGNOSIS — Z7902 Long term (current) use of antithrombotics/antiplatelets: Secondary | ICD-10-CM | POA: Diagnosis not present

## 2019-10-22 DIAGNOSIS — I739 Peripheral vascular disease, unspecified: Secondary | ICD-10-CM | POA: Diagnosis not present

## 2019-10-22 DIAGNOSIS — M1990 Unspecified osteoarthritis, unspecified site: Secondary | ICD-10-CM | POA: Diagnosis not present

## 2019-10-22 DIAGNOSIS — R69 Illness, unspecified: Secondary | ICD-10-CM | POA: Diagnosis not present

## 2019-10-22 DIAGNOSIS — Z008 Encounter for other general examination: Secondary | ICD-10-CM | POA: Diagnosis not present

## 2019-10-22 DIAGNOSIS — E785 Hyperlipidemia, unspecified: Secondary | ICD-10-CM | POA: Diagnosis not present

## 2019-10-22 DIAGNOSIS — Z809 Family history of malignant neoplasm, unspecified: Secondary | ICD-10-CM | POA: Diagnosis not present

## 2019-10-22 DIAGNOSIS — I2510 Atherosclerotic heart disease of native coronary artery without angina pectoris: Secondary | ICD-10-CM | POA: Diagnosis not present

## 2019-11-09 ENCOUNTER — Other Ambulatory Visit: Payer: Self-pay | Admitting: Family Medicine

## 2019-12-09 ENCOUNTER — Other Ambulatory Visit: Payer: Self-pay | Admitting: *Deleted

## 2019-12-09 MED ORDER — OLANZAPINE 5 MG PO TBDP: 5.0000 mg | ORAL_TABLET | Freq: Every day | ORAL | 0 refills | Status: DC

## 2019-12-31 ENCOUNTER — Encounter: Payer: Self-pay | Admitting: Cardiology

## 2019-12-31 ENCOUNTER — Ambulatory Visit: Payer: Medicare HMO | Admitting: Cardiology

## 2019-12-31 ENCOUNTER — Other Ambulatory Visit: Payer: Self-pay

## 2019-12-31 DIAGNOSIS — I25118 Atherosclerotic heart disease of native coronary artery with other forms of angina pectoris: Secondary | ICD-10-CM

## 2019-12-31 DIAGNOSIS — I10 Essential (primary) hypertension: Secondary | ICD-10-CM

## 2019-12-31 MED ORDER — METOPROLOL SUCCINATE ER 50 MG PO TB24: 50.0000 mg | ORAL_TABLET | Freq: Every day | ORAL | 3 refills | Status: DC

## 2019-12-31 NOTE — Progress Notes (Signed)
Primary Physician/Referring:  Dettinger, [name], MD  Patient ID: Joseph Byrd, male    DOB: 02-Sep-1933, 86 y.o.   MRN: [id number]  Chief Complaint  Patient presents with  . Follow-up    6 month  . Coronary Artery Disease  . Hypertension   HPI:    HPI: Joseph Byrd  is a 86 y.o. African-American male with coronary artery disease and angioplasty to his mid LAD in 2012 and to distal dominant circumflex coronary artery in 2015, hypertension, hyperlipidemia, symptoms suggestive mild PAD and claudication. He underwent four-vessel bypass on 05/19/2019 due to progression of CAD.   He is presently doing well.  He has not had any worsening dyspnea, no further leg edema, no PND or orthopnea.  Tolerating all his medications well.  Past Medical History:  Diagnosis Date  . Arthritis    "knees, wrists, knuckles" (04/26/2014)  . CAD (coronary artery disease)   . Headache    "had a couple/wk til ~ 2 wks ago" (04/26/2014)  . Hyperlipidemia   . Hypertension   . LBBB (left bundle branch block) 07/06/2018  . PAD (peripheral artery disease) ([place])   . 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 Type II diabetes mellitus ([place])    Past Surgical History:  Procedure Laterality Date  . BACK SURGERY    . CATARACT EXTRACTION W/ INTRAOCULAR LENS  IMPLANT, BILATERAL Bilateral ~ 2014  . CORONARY ANGIOPLASTY WITH STENT PLACEMENT  08/2010; 04/26/2014   "1, LAD; 1"  . CORONARY ARTERY BYPASS GRAFT N/A 05/19/2019   Procedure: CORONARY ARTERY BYPASS GRAFTING (CABG), on pump, times four using left internal mammary artery and right greater saphenous vein harvested endoscopically;  Surgeon: Lajuana Matte, MD;  Location: [facility];  Service: Open Heart Surgery;  Laterality: N/A;  . LEFT HEART CATH AND CORONARY ANGIOGRAPHY N/A 05/18/2019   Procedure: LEFT HEART CATH AND CORONARY ANGIOGRAPHY;  Surgeon: Adrian Prows, MD;  Location: [facility];  Service:  Cardiovascular;  Laterality: N/A;  . LEFT HEART CATHETERIZATION WITH CORONARY ANGIOGRAM N/A 04/26/2014   Procedure: LEFT HEART CATHETERIZATION WITH CORONARY ANGIOGRAM;  Surgeon: Laverda Page, MD;  Location: [facility] C[facility];  Service: Cardiovascular;  Laterality: N/A;  . LUMBAR LAMINECTOMY/DECOMPRESSION MICRODISCECTOMY  01/2010   Social History   Tobacco Use  . Smoking status: Former Smoker    Packs/day: 0.12    Years: 10.00    Pack years: 1.20    Types: Cigarettes    Quit date: 09/04/1982    Years since quitting: 37.3  . Smokeless tobacco: Never Used  Substance Use Topics  . Alcohol use: No    ROS  Review of Systems  Cardiovascular: Positive for dyspnea on exertion and leg swelling.  Gastrointestinal: Negative for melena.   Objective  Blood pressure 121/70, pulse 65, height 5\' 9"  (1.753 m), weight 164 lb 6.4 oz (74.6 kg), SpO2 97 %. Body mass index is 24.28 kg/m.   Vitals with BMI 12/31/2019 07/12/2019 07/12/2019  Height 5\' 9"  5\' 9"  -  Weight 164 lbs 6 oz 163 lbs -  BMI 12.45 80.99 -  Systolic 833 825 053  Diastolic 70 976 734  Pulse 65 88 80    Physical Exam Cardiovascular:     Pulses:          Carotid pulses are 2+ on the right side and 2+ on the left side.  Femoral pulses are 2+ on the right side and 2+ on the left side.      Popliteal pulses are 0 on the right side and 1+ on the left side.       Dorsalis pedis pulses are 0 on the right side and 0 on the left side.       Posterior tibial pulses are 0 on the right side and 0 on the left side.     Heart sounds: S1 normal and S2 normal. Murmur heard.  Early systolic murmur is present with a grade of 2/6 at the upper right sternal border and apex.  No gallop.      Comments: No leg edema. No JVD Pulmonary:     Effort: Pulmonary effort is normal.     Breath sounds: Normal breath sounds. No wheezing or rales.  Abdominal:     General: Bowel sounds are normal.     Palpations: Abdomen is soft.    Radiology: No results  found.  Laboratory examination:   CMP Latest Ref Rng & Units 07/26/2019 05/24/2019 05/23/2019  Glucose 65 - 99 mg/dL 119(H) 94 141(H)  BUN 8 - 27 mg/dL 16 13 14   Creatinine 0.76 - 1.27 mg/dL 1.01 1.22 1.13  Sodium 134 - 144 mmol/L 143 143 140  Potassium 3.5 - 5.2 mmol/L 3.9 3.8 3.8  Chloride 96 - 106 mmol/L 106 112(H) 111  CO2 20 - 29 mmol/L 23 22 20(L)  Calcium 8.6 - 10.2 mg/dL 9.5 8.3(L) 8.5(L)  Total Protein 6.5 - 8.1 g/dL - - -  Total Bilirubin 0.3 - 1.2 mg/dL - - -  Alkaline Phos 38 - 126 U/L - - -  AST 15 - 41 U/L - - -  ALT 0 - 44 U/L - - -   CBC Latest Ref Rng & Units 07/26/2019 06/28/2019 05/23/2019  WBC 3.4 - 10.8 x10E3/uL 5.7 6.4 9.4  Hemoglobin 13.0 - 17.7 g/dL 11.8(L) 10.9(L) 8.1(L)  Hematocrit 37.5 - 51.0 % 38.3 34.8(L) 26.4(L)  Platelets 150 - 450 x10E3/uL 548(H) 537(H) 544(H)    Lipid Panel Recent Labs    05/14/19 1433  CHOL 124  TRIG 41  LDLCALC 50  HDL 64    HEMOGLOBIN A1C Lab Results  Component Value Date   HGBA1C 4.9 05/19/2019   MPG 93.93 05/19/2019   TSH Recent Labs    05/14/19 1433  TSH 1.530   Medications   Current Outpatient Medications  Medication Instructions  . acetaminophen (TYLENOL) 650 mg, Oral, Every 6 hours PRN  . amLODIPine-Valsartan-HCTZ (EXFORGE HCT) 5-160-25 MG TABS 1 tablet, Oral, BH-each morning  . aspirin EC 81 mg, Oral,  Every morning - 10a  . atorvastatin (LIPITOR) 20 MG tablet TAKE 1 TABLET BY MOUTH EVERY DAY  . clopidogrel (PLAVIX) 75 mg, Oral, Daily  . donepezil (ARICEPT) 10 mg, Oral, Daily at bedtime  . metoprolol succinate (TOPROL-XL) 50 mg, Oral, Daily, Take with or immediately following a meal.  . Multiple Vitamin (MULTIVITAMIN WITH MINERALS) TABS tablet 1 tablet, Oral, Daily  . nitroGLYCERIN (NITROSTAT) 0.4 MG SL tablet DISSOLVE 1 TABLET UNDER TONGUE EVERY 5 MIN AS NEEDED FOR CHEST PAIN, MAX 3 DOSES IN 15 MIN  . OLANZapine zydis (ZYPREXA) 5 mg, Oral, Daily at bedtime  . ONETOUCH VERIO test strip TEST BLOOD  SUGAR DAILY DX E11.9  . Polyethyl Glycol-Propyl Glycol (SYSTANE OP) 1 drop, Both Eyes, Daily  . tamsulosin (FLOMAX) 0.4 MG CAPS capsule TAKE 1 CAPSULE BY MOUTH EVERY DAY   Cardiac Studies:  Hospital Lexiscan Myoview stress test 03/20/2018:  There was no ST segment deviation noted during stress.   Findings consistent with prior inferior/basal inferoseptal/inferoapical myocardial infarction. There is no current ischemia. This is a low to intermediate risk study based on prior scar and mildly decreased LVEF, there is no myocardium currently at jeopardy. The left ventricular ejection fraction is mildly decreased (48%).  Left Heart Catheterization 05/18/19:  Normal LVEDP.  LV gram although performed is nondiagnostic due to frequent PVCs. Left main: Ulcerated stenosis of 20 to 30% initially, progressed to at least 50 to 60% at the end of diagnostic angiography with dissection plane clearly evident. LAD: Ostial LAD with a 80 to 90% stenosis, calcific.  Diffuse disease noted in the mid to distal LAD.  Previously placed stent in the mid LAD in 2012: 2.25x22 Resolute) widely patent. Circumflex: Mid circumflex stent (3.5x64mm Promus stent 04/26/14) widely patent.  Dominant vessel.  Mild diffuse disease.  OM1 is fairly large, ostial 90% stenosis. RI: Large vessel, with multiple branches.  Again calcified.  Ostium has a 90% stenosis.  Recommendation: Patient has unstable plaque in the left main.  This was exacerbated by angiography however remained stable without chest pain, no EKG abnormality, normal hemodynamics.  We will admit the patient to the hospital in view of high risk for ACS and potential for sudden cardiac death.  He will be evaluated for urgent CABG. He is presently on aspirin and also moderate dose of cilostazol 50 mg twice daily.  This will certainly cause bleeding risk.  Carotid artery duplex 05/18/2019: Right Carotid: Velocities in the right ICA are consistent with a 1-39% stenosis.    Left Carotid: Velocities in the left ICA are consistent with a 1-39% stenosis. Vertebrals: Bilateral vertebral arteries demonstrate antegrade flow.  ABI 05/18/2019: Right ABI: Resting right ankle-brachial index indicates moderate right lower extremity arterial disease. Right PTA indicated normal limits, Right DP indicates moderate PAD. Left ABI: Resting left ankle-brachial index is within normal range. No evidence of significant left lower extremity arterial disease. Right   Rt Pressure (mmHg)IndexWaveform Comment  +--------+------------------+-----+---------+--------+ Brachial                       triphasic         +--------+------------------+-----+---------+--------+ PTA     157               1.05 biphasic          +--------+------------------+-----+---------+--------+ DP      106               0.71 biphasic          +--------+------------------+-----+---------+--------+   +--------+------------------+-----+---------+-------+ Left    Lt Pressure (mmHg)IndexWaveform Comment +--------+------------------+-----+---------+-------+ [id number]                    triphasic        +--------+------------------+-----+---------+-------+ PTA     180               1.21 biphasic         +--------+------------------+-----+---------+-------+ DP      172               1.15 biphasic         +--------+------------------+-----+---------+-------+  Assessment     ICD-10-CM   1. Coronary artery disease of native artery of native heart with stable angina pectoris (HCC)  I25.118 metoprolol succinate (TOPROL-XL) 50 MG 24 hr tablet  2. Essential hypertension  I10 metoprolol  succinate (TOPROL-XL) 50 MG 24 hr tablet   EKG 06/02/2019: Normal sinus rhythm at rate of 77 bpm, left axis deviation, left anterior fascicular block.  Incomplete right bundle branch block.  T wave abnormality, cannot exclude lateral ischemia. Low voltage.   EKG 05/20/2019: Normal sinus  rhythm at rate of 86 bpm, left axis deviation, left intravascular block.  ST elevation in V1 to V5, consider early repolarization, cannot exclude anterior injury pattern.  EKG 05/14/2019: Normal sinus rhythm at rate of 72 bpm, left atrial enlargement, normal axis.  Incomplete right bundle branch block.  Baseline artifact.  Nonspecific T abnormality.  No significant change from 01/06/2019.   Recommendations:   Cali Cuartas  is a 86 y.o. African-American male with coronary artery disease and angioplasty to his mid LAD in 2012 and to distal dominant circumflex coronary artery in 2015, hypertension, hyperlipidemia, symptoms suggestive mild PAD and claudication. He underwent four-vessel bypass on 05/19/2019 due to progression of CAD.  He has recuperated well from bypass surgery and has resumed all his physical activity.  He presents here for 70-month office visit, he is completely recuperated from his recent bypass surgery, he has not had any further leg edema.  He has continued to remain active.  His blood pressure is well controlled, I reviewed his lipids which are also under excellent control.  He is on appropriate medical therapy.  No changes in the medications were done today.  I will see him back in 1 year for follow-up.   Adrian Prows, MD, [facility] 12/31/2019, 2:43 PM Office: [phone]

## 2020-01-06 ENCOUNTER — Ambulatory Visit: Payer: Medicare HMO | Admitting: Cardiology

## 2020-01-11 ENCOUNTER — Other Ambulatory Visit: Payer: Self-pay | Admitting: Family Medicine

## 2020-01-11 NOTE — Telephone Encounter (Signed)
Last office visit 08/04/2019 Last refill 12/09/2019, #30, no refills Dettinger patient

## 2020-02-01 ENCOUNTER — Other Ambulatory Visit: Payer: Self-pay

## 2020-02-01 ENCOUNTER — Ambulatory Visit (INDEPENDENT_AMBULATORY_CARE_PROVIDER_SITE_OTHER): Payer: Medicare HMO

## 2020-02-01 DIAGNOSIS — Z0000 Encounter for general adult medical examination without abnormal findings: Secondary | ICD-10-CM

## 2020-02-01 NOTE — Patient Instructions (Signed)
  [facility] Maintenance Summary and Written Plan of Care  Joseph Byrd ,  Thank you for allowing me to perform your Medicare Annual Wellness Visit and for your ongoing commitment to your health.   Health Maintenance & Immunization History Health Maintenance  Topic Date Due  . COVID-19 Vaccine (1) Never done  . OPHTHALMOLOGY EXAM  11/02/2015  . FOOT EXAM  06/23/2019  . HEMOGLOBIN A1C  11/17/2019  . INFLUENZA VACCINE  01/02/2020  . TETANUS/TDAP  08/29/2024  . PNA vac Low Risk Adult  Completed   Immunization History  Administered Date(s) Administered  . Fluad Quad(high Dose 65+) 02/03/2019  . Influenza, High Dose Seasonal PF 04/07/2015, 03/20/2016, 03/24/2017, 03/20/2018  . Influenza,inj,Quad PF,6+ Mos 03/04/2013, 04/27/2014  . Pneumococcal Conjugate-13 02/14/2015  . Pneumococcal-Unspecified 11/01/2008  . Tdap 08/30/2014  . Zoster 11/29/2013    These are the patient goals that we discussed: Goals Addressed   None      This is a list of Health Maintenance Items that are overdue or due now: Health Maintenance Due  Topic Date Due  . COVID-19 Vaccine (1) Never done  . OPHTHALMOLOGY EXAM  11/02/2015  . FOOT EXAM  06/23/2019  . HEMOGLOBIN A1C  11/17/2019  . INFLUENZA VACCINE  01/02/2020     Orders/Referrals Placed Today: No orders of the defined types were placed in this encounter.  (Contact our referral department at [phone] if you have not spoken with someone about your referral appointment within the next 5 days)    Follow-up Plan  Scheduled with Dr. Warrick Parisian 03/02/2020 at 9:25 am.

## 2020-02-01 NOTE — Progress Notes (Addendum)
MEDICARE ANNUAL WELLNESS VISIT  02/01/2020  Telephone Visit Disclaimer This Medicare AWV was conducted by telephone due to national recommendations for restrictions regarding the COVID-19 Pandemic (e.g. social distancing).  I verified, using two identifiers, that I am speaking with Joseph Byrd or their authorized healthcare agent. I discussed the limitations, risks, security, and privacy concerns of performing an evaluation and management service by telephone and the potential availability of an in-person appointment in the future. The patient expressed understanding and agreed to proceed.   Subjective:  Joseph Byrd is a 86 y.o. male patient of Dettinger, [name], MD who had a Medicare Annual Wellness Visit today via telephone. Joseph Byrd is Retired and lives with their spouse. he has six children. he reports that he is socially active and does interact with friends/family regularly. he is minimally physically active and enjoys puzzels.  Patient Care Team: Dettinger, [name], MD as PCP - General (Family Medicine) Adrian Prows, MD as PCP - Cardiology (Cardiology) Adrian Prows, MD as Attending Physician (Cardiology) [place], [place], [place] (Optometry)  Advanced Directives 02/01/2020 05/18/2019 04/27/2019 01/28/2019 03/29/2018 03/19/2018 03/19/2018  Does Patient Have a Medical Advance Directive? Yes No No No No No No  Type of Paramedic of [place];Living will - - - - - -  Would patient like information on creating a medical advance directive? No - Patient declined No - Patient declined No - Patient declined No - Patient declined No - Patient declined No - Patient declined No - Patient declined    Hospital Utilization Over the Past 12 Months: # of hospitalizations or ER visits: 1 # of surgeries: 1  Review of Systems    Patient reports that his overall health is unchanged compared to last year.  Patient Reported Readings (BP, Pulse, CBG, Weight,  etc) none  Pain Assessment Pain : No/denies pain     Current Medications & Allergies (verified) Allergies as of 02/01/2020       Reactions   Lisinopril         Medication List        Accurate as of February 01, 2020  8:45 AM. If you have any questions, ask your nurse or doctor.          acetaminophen 325 MG tablet Commonly known as: TYLENOL Take 650 mg by mouth every 6 (six) hours as needed for moderate pain or headache.   amLODIPine-Valsartan-HCTZ 5-160-25 MG Tabs Commonly known as: Exforge HCT Take 1 tablet by mouth every morning.   aspirin EC 81 MG tablet Take 81 mg by mouth every morning.   atorvastatin 20 MG tablet Commonly known as: LIPITOR TAKE 1 TABLET BY MOUTH EVERY DAY   clopidogrel 75 MG tablet Commonly known as: PLAVIX Take 1 tablet (75 mg total) by mouth daily.   donepezil 10 MG tablet Commonly known as: ARICEPT Take 10 mg by mouth at bedtime.   metoprolol succinate 50 MG 24 hr tablet Commonly known as: TOPROL-XL Take 1 tablet (50 mg total) by mouth daily. Take with or immediately following a meal.   multivitamin with minerals Tabs tablet Take 1 tablet by mouth daily.   nitroGLYCERIN 0.4 MG SL tablet Commonly known as: NITROSTAT DISSOLVE 1 TABLET UNDER TONGUE EVERY 5 MIN AS NEEDED FOR CHEST PAIN, MAX 3 DOSES IN 15 MIN What changed: See the new instructions.   OLANZapine zydis 5 MG disintegrating tablet Commonly known as: ZYPREXA Take 1 tablet (5 mg total) by mouth at bedtime. Needs to be seen  before next refill   OneTouch Verio test strip Generic drug: glucose blood TEST BLOOD SUGAR DAILY DX E11.9   SYSTANE OP Place 1 drop into both eyes daily.   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   LIMA to LAD, SVG to D1, SVG to OM 1 and SVG to RI 05/19/2019   Type II diabetes mellitus ([place])    Past Surgical History:  Procedure Laterality Date   BACK SURGERY     CATARACT EXTRACTION W/ INTRAOCULAR LENS  IMPLANT, BILATERAL Bilateral ~ 2014   CORONARY ANGIOPLASTY WITH STENT PLACEMENT  08/2010; 04/26/2014   "1, LAD; 1"   CORONARY ARTERY BYPASS GRAFT N/A 05/19/2019   Procedure: CORONARY ARTERY BYPASS GRAFTING (CABG), on pump, times four using left internal mammary artery and right greater saphenous vein harvested endoscopically;  Surgeon: Lajuana Matte, MD;  Location: [facility];  Service: Open Heart Surgery;  Laterality: N/A;   LEFT HEART CATH AND CORONARY ANGIOGRAPHY N/A 05/18/2019   Procedure: LEFT HEART CATH AND CORONARY ANGIOGRAPHY;  Surgeon: Adrian Prows, MD;  Location: [facility];  Service: Cardiovascular;  Laterality: N/A;   LEFT HEART CATHETERIZATION WITH CORONARY ANGIOGRAM N/A 04/26/2014   Procedure: LEFT HEART CATHETERIZATION WITH CORONARY ANGIOGRAM;  Surgeon: Laverda Page, MD;  Location[facility] C[facility];  Service: Cardiovascular;  Laterality: N/A;   LUMBAR LAMINECTOMY/DECOMPRESSION MICRODISCECTOMY  01/2010   Family History  Problem Relation Age of Onset   Cancer Sister    Social History   Socioeconomic History   Marital status: Married    Spouse name: Clarise Cruz   Number of children: 6   Years of education: 3   Highest education level: 3rd grade  Occupational History   Occupation: retired  Tobacco Use   Smoking status: Former Smoker    Packs/day: 0.12    Years: 10.00    Pack years: 1.20    Types: Cigarettes    Quit date: 09/04/1982    Years since quitting: 37.4   Smokeless tobacco: Never Used  Vaping Use   Vaping Use: Never used  Substance and Sexual Activity   Alcohol use: No   Drug  use: No   Sexual activity: Yes  Other Topics Concern   Not on file  Social History Narrative   Not on file   Social Determinants of Health   Financial Resource Strain:    Difficulty of Paying Living Expenses: Not on file  Food Insecurity:    Worried About Charity fundraiser in the Last Year: Not on file   [employer] of Food in the Last Year: Not on file  Transportation Needs:    Lack of Transportation (Medical): Not on file   Lack of Transportation (Non-Medical): Not on file  Physical Activity:    Days of Exercise per Week: Not on file   Minutes of Exercise per Session: Not on file  Stress:    Feeling of Stress : Not on file  Social Connections:    Frequency  of Communication with Friends and Family: Not on file   Frequency of Social Gatherings with Friends and Family: Not on file   Attends Religious Services: Not on file   Active Member of Clubs or Organizations: Not on file   Attends Archivist Meetings: Not on file   Marital Status: Not on file    Activities of Daily Living In your present state of health, do you have any difficulty performing the following activities: 02/01/2020  Hearing? N  Vision? N  Difficulty concentrating or making decisions? Y  Walking or climbing stairs? Y  Dressing or bathing? N  Doing errands, shopping? Y  Preparing Food and eating ? N  Using the Toilet? N  In the past six months, have you accidently leaked urine? N  Do you have problems with loss of bowel control? N  Managing your Medications? Y  Managing your Finances? Y  Housekeeping or managing your Housekeeping? Y  Some recent data might be hidden    Patient Education/ Literacy How often do you need to have someone help you when you read instructions, pamphlets, or other written materials from your doctor or pharmacy?: 4 - Often What is the last grade level you completed in school?: 8th grade  Exercise Current Exercise Habits: Home exercise routine, Type of exercise:  walking, Time (Minutes): 15, Frequency (Times/Week): 3, Weekly Exercise (Minutes/Week): 45, Intensity: Mild, Exercise limited by: neurologic condition(s);cardiac condition(s);orthopedic condition(s)  Diet Patient reports consuming 3 meals a day and 2 snack(s) a day Patient reports that his primary diet is: Regular Patient reports that she does have regular access to food.   Depression Screen PHQ 2/9 Scores 02/01/2020 02/26/2019 02/03/2019 01/28/2019 06/22/2018 12/18/2017 06/19/2017  PHQ - 2 Score 0 0 0 0 0 0 0  PHQ- 9 Score - 0 - - - - -     Fall Risk Fall Risk  02/01/2020 02/26/2019 02/03/2019 01/28/2019 06/22/2018  Falls in the past year? 0 0 0 0 0  Number falls in past yr: - - - 0 -  Injury with Fall? - - - 0 -  Follow up - - - Falls prevention discussed -  Comment - - - get rid of all throw rugs in the house, adequate lighting in walkways and grabrails in the bathroom -     Objective:  Joseph Byrd seemed alert and oriented and he participated appropriately during our telephone visit.  Blood Pressure Weight BMI  BP Readings from Last 3 Encounters:  12/31/19 121/70  07/12/19 (!) 155/100  07/09/19 (!) 155/93   Wt Readings from Last 3 Encounters:  12/31/19 164 lb 6.4 oz (74.6 kg)  07/12/19 163 lb (73.9 kg)  07/09/19 169 lb 8 oz (76.9 kg)   BMI Readings from Last 1 Encounters:  12/31/19 24.28 kg/m    *Unable to obtain current vital signs, weight, and BMI due to telephone visit type  Hearing/Vision  [place] did not seem to have difficulty with hearing/understanding during the telephone conversation Reports that he has not had a formal eye exam by an eye care professional within the past year Reports that he has not had a formal hearing evaluation within the past year *Unable to fully assess hearing and vision during telephone visit type  Cognitive Function: 6CIT Screen 02/01/2020 01/28/2019  What Year? 4 points 0 points  What month? 3 points 0 points  What time? 0 points 0  points  Count back from 20 4 points 4 points  Months in reverse 0 points  4 points  Repeat phrase 8 points 4 points  Total Score 19 12   (Normal:0-7, Significant for Dysfunction: >8)  Normal Cognitive Function Screening: No: 19   Immunization & Health Maintenance Record Immunization History  Administered Date(s) Administered   Fluad Quad(high Dose 65+) 02/03/2019   Influenza, High Dose Seasonal PF 04/07/2015, 03/20/2016, 03/24/2017, 03/20/2018   Influenza,inj,Quad PF,6+ Mos 03/04/2013, 04/27/2014   Pneumococcal Conjugate-13 02/14/2015   Pneumococcal-Unspecified 11/01/2008   Tdap 08/30/2014   Zoster 11/29/2013    Health Maintenance  Topic Date Due   COVID-19 Vaccine (1) Never done   OPHTHALMOLOGY EXAM  11/02/2015   FOOT EXAM  06/23/2019   HEMOGLOBIN A1C  11/17/2019   INFLUENZA VACCINE  01/02/2020   TETANUS/TDAP  08/29/2024   PNA vac Low Risk Adult  Completed       Assessment  This is a routine wellness examination for Joseph Byrd.  Health Maintenance: Due or Overdue Health Maintenance Due  Topic Date Due   COVID-19 Vaccine (1) Never done   OPHTHALMOLOGY EXAM  11/02/2015   FOOT EXAM  06/23/2019   HEMOGLOBIN A1C  11/17/2019   INFLUENZA VACCINE  01/02/2020    Joseph Byrd does not need a referral for Community Assistance: Care Management:   no Social Work:    no Prescription Assistance:  no Nutrition/Diabetes Education:  no   Plan:  Personalized Goals Goals Addressed   None    Personalized Health Maintenance & Screening Recommendations   Lung Cancer Screening Recommended: no (Low Dose CT Chest recommended if Age 30-80 years, 30 pack-year currently smoking OR have quit w/in past 15 years) Hepatitis C Screening recommended: no HIV Screening recommended: no  Advanced Directives: Written information was not prepared per patient's request.  Referrals & Orders No orders of the defined types were placed in this encounter.   Follow-up  Plan Follow-up with Dettinger, [name], MD as planned Schedule 03/02/2020   I have personally reviewed and noted the following in the patient's chart:   Medical and social history Use of alcohol, tobacco or illicit drugs  Current medications and supplements Functional ability and status Nutritional status Physical activity Advanced directives List of other physicians Hospitalizations, surgeries, and ER visits in previous 12 months Vitals Screenings to include cognitive, depression, and falls Referrals and appointments  In addition, I have reviewed and discussed with Joseph Byrd certain preventive protocols, quality metrics, and best practice recommendations. A written personalized care plan for preventive services as well as general preventive health recommendations is available and can be mailed to the patient at his request.      Alphonzo Dublin  02/01/2020     I have reviewed the CCM documentation and agree with the written assessment and plan of care.  Evelina Dun, FNP

## 2020-02-03 ENCOUNTER — Other Ambulatory Visit: Payer: Self-pay | Admitting: Family

## 2020-02-08 ENCOUNTER — Other Ambulatory Visit: Payer: Self-pay | Admitting: Cardiology

## 2020-02-10 ENCOUNTER — Other Ambulatory Visit: Payer: Self-pay | Admitting: Cardiology

## 2020-02-11 ENCOUNTER — Other Ambulatory Visit: Payer: Self-pay | Admitting: Cardiology

## 2020-02-14 ENCOUNTER — Other Ambulatory Visit: Payer: Self-pay | Admitting: Cardiology

## 2020-02-16 DIAGNOSIS — I10 Essential (primary) hypertension: Secondary | ICD-10-CM | POA: Diagnosis not present

## 2020-02-16 DIAGNOSIS — R69 Illness, unspecified: Secondary | ICD-10-CM | POA: Diagnosis not present

## 2020-02-16 DIAGNOSIS — M130 Polyarthritis, unspecified: Secondary | ICD-10-CM | POA: Diagnosis not present

## 2020-03-02 ENCOUNTER — Ambulatory Visit (INDEPENDENT_AMBULATORY_CARE_PROVIDER_SITE_OTHER): Payer: Medicare HMO | Admitting: Family Medicine

## 2020-03-02 ENCOUNTER — Other Ambulatory Visit: Payer: Self-pay

## 2020-03-02 ENCOUNTER — Encounter: Payer: Self-pay | Admitting: Family Medicine

## 2020-03-02 VITALS — BP 137/71 | HR 68 | Temp 98.0°F | Ht 69.0 in | Wt 165.0 lb

## 2020-03-02 DIAGNOSIS — R7989 Other specified abnormal findings of blood chemistry: Secondary | ICD-10-CM

## 2020-03-02 DIAGNOSIS — Z794 Long term (current) use of insulin: Secondary | ICD-10-CM

## 2020-03-02 DIAGNOSIS — I10 Essential (primary) hypertension: Secondary | ICD-10-CM | POA: Diagnosis not present

## 2020-03-02 DIAGNOSIS — Z23 Encounter for immunization: Secondary | ICD-10-CM

## 2020-03-02 DIAGNOSIS — R739 Hyperglycemia, unspecified: Secondary | ICD-10-CM

## 2020-03-02 DIAGNOSIS — E1169 Type 2 diabetes mellitus with other specified complication: Secondary | ICD-10-CM | POA: Diagnosis not present

## 2020-03-02 DIAGNOSIS — E785 Hyperlipidemia, unspecified: Secondary | ICD-10-CM

## 2020-03-02 DIAGNOSIS — R350 Frequency of micturition: Secondary | ICD-10-CM | POA: Diagnosis not present

## 2020-03-02 LAB — MICROSCOPIC EXAMINATION
Bacteria, UA: NONE SEEN
RBC, Urine: NONE SEEN /hpf (ref 0–2)

## 2020-03-02 LAB — URINALYSIS, COMPLETE
Bilirubin, UA: NEGATIVE
Glucose, UA: NEGATIVE
Ketones, UA: NEGATIVE
Leukocytes,UA: NEGATIVE
Nitrite, UA: NEGATIVE
Protein,UA: NEGATIVE
RBC, UA: NEGATIVE
Specific Gravity, UA: 1.02 (ref 1.005–1.030)
Urobilinogen, Ur: 1 mg/dL (ref 0.2–1.0)
pH, UA: 7 (ref 5.0–7.5)

## 2020-03-02 LAB — BAYER DCA HB A1C WAIVED: HB A1C (BAYER DCA - WAIVED): 4.9 % (ref <7.0)

## 2020-03-02 MED ORDER — OLANZAPINE 5 MG PO TBDP
5.0000 mg | ORAL_TABLET | Freq: Every day | ORAL | 3 refills | Status: DC
Start: 2020-03-02 — End: 2021-01-31

## 2020-03-02 NOTE — Progress Notes (Signed)
BP 137/71   Pulse 68   Temp 98 F (36.7 C)   Ht '5\' 9"'  (1.753 m)   Wt 165 lb (74.8 kg)   SpO2 95%   BMI 24.37 kg/m    Subjective:   Patient ID: Joseph Byrd, male    DOB: [birth year], 86 y.o.   MRN: [id number]  HPI: Joseph Byrd is a 86 y.o. male presenting on 03/02/2020 for Medical Management of Chronic Issues, Hyperlipidemia, and Hypertension   HPI Hypertension Patient states he has been doing well; he denies any complaints. He is currently taking amlodipine-valsartan-HCTZ and denies medications issues or side effects. Per wife, home BP's have been ranging approximately 321 systolic.   Patient does endorse occasional episodes of chest pain which primarily occur when he is working outside. He states that these episodes last a few seconds and are relieved by nitroglycerin. He denies dizziness, dyspnea, syncope, diaphoresis. He is currently managed by cardiology.  And has a history of a quadruple bypass  Hyperlipidemia Patient has been taking atorvastatin for hyperlipidemia; denies myalgias or other side effects. He states he has been eating a healthy diet and stays active by walking and working outside.  Elevated Blood Glucose Patient states that he has managed elevated blood glucose in the past with diet and lifestyle; has never required medication management. He is no longer checking BG at home. He is scheduled for ophthalmology visit in 1 month.  Patient does endorse increased frequency of urination over the past 3 weeks. It is not associated with pain, burning, nocturia, hesitancy, flank pain. He denies visual changes, GI symptoms, or neuropathy.  Relevant past medical, surgical, family and social history reviewed and updated as indicated. Interim medical history since our last visit reviewed. Allergies and medications reviewed and updated.  Review of Systems  Constitutional: Negative for diaphoresis and fatigue.  Eyes: Negative for visual disturbance.    Respiratory: Negative for shortness of breath.   Cardiovascular: Positive for chest pain. Negative for palpitations and leg swelling.  Gastrointestinal: Negative for abdominal pain, constipation, diarrhea, nausea and vomiting.  Endocrine: Positive for polyuria.  Genitourinary: Positive for frequency. Negative for difficulty urinating, discharge, dysuria, flank pain, hematuria and urgency.  Musculoskeletal: Negative for arthralgias.  Neurological: Negative for dizziness, syncope, weakness and headaches.    Per HPI unless specifically indicated above   Allergies as of 03/02/2020      Reactions   Lisinopril       Medication List       Accurate as of March 02, 2020 10:56 AM. If you have any questions, ask your nurse or doctor.        acetaminophen 325 MG tablet Commonly known as: TYLENOL Take 650 mg by mouth every 6 (six) hours as needed for moderate pain or headache.   amLODIPine-Valsartan-HCTZ 5-160-25 MG Tabs Commonly known as: Exforge HCT Take 1 tablet by mouth every morning.   aspirin EC 81 MG tablet Take 81 mg by mouth every morning.   atorvastatin 20 MG tablet Commonly known as: LIPITOR TAKE 1 TABLET BY MOUTH EVERY DAY   clopidogrel 75 MG tablet Commonly known as: PLAVIX Take 1 tablet (75 mg total) by mouth daily.   donepezil 10 MG tablet Commonly known as: ARICEPT Take 10 mg by mouth at bedtime.   hydrochlorothiazide 25 MG tablet Commonly known as: HYDRODIURIL TAKE 1 TABLET BY MOUTH EVERY DAY   metoprolol succinate 50 MG 24 hr tablet Commonly known as: TOPROL-XL Take 1 tablet (50 mg total) by mouth daily.  Take with or immediately following a meal.   multivitamin with minerals Tabs tablet Take 1 tablet by mouth daily.   nitroGLYCERIN 0.4 MG SL tablet Commonly known as: NITROSTAT DISSOLVE 1 TABLET UNDER TONGUE EVERY 5 MIN AS NEEDED FOR CHEST PAIN, MAX 3 DOSES IN 15 MIN What changed: See the new instructions.   OLANZapine zydis 5 MG disintegrating  tablet Commonly known as: ZYPREXA Take 1 tablet (5 mg total) by mouth at bedtime. What changed: additional instructions Changed by: [name] Darrel Baroni, MD   OneTouch Verio test strip Generic drug: glucose blood TEST BLOOD SUGAR DAILY DX E11.9   SYSTANE OP Place 1 drop into both eyes daily.   tamsulosin 0.4 MG Caps capsule Commonly known as: FLOMAX TAKE 1 CAPSULE BY MOUTH EVERY DAY        Objective:   BP 137/71   Pulse 68   Temp 98 F (36.7 C)   Ht '5\' 9"'  (1.753 m)   Wt 165 lb (74.8 kg)   SpO2 95%   BMI 24.37 kg/m   Wt Readings from Last 3 Encounters:  03/02/20 165 lb (74.8 kg)  12/31/19 164 lb 6.4 oz (74.6 kg)  07/12/19 163 lb (73.9 kg)    Physical Exam Constitutional:      General: He is not in acute distress.    Appearance: Normal appearance. He is not ill-appearing.  HENT:     Head: Normocephalic and atraumatic.  Eyes:     Conjunctiva/sclera: Conjunctivae normal.     Pupils: Pupils are equal, round, and reactive to light.  Cardiovascular:     Rate and Rhythm: Normal rate and regular rhythm.     Pulses: Normal pulses.     Heart sounds: Normal heart sounds.  Pulmonary:     Effort: Pulmonary effort is normal.     Breath sounds: Normal breath sounds.  Abdominal:     General: Abdomen is flat. There is no distension.     Palpations: Abdomen is soft.     Tenderness: There is no abdominal tenderness. There is no guarding.  Musculoskeletal:     Cervical back: Normal range of motion and neck supple.     Right lower leg: No edema.     Left lower leg: No edema.  Skin:    General: Skin is warm and dry.     Capillary Refill: Capillary refill takes less than 2 seconds.  Neurological:     Mental Status: He is alert and oriented to person, place, and time.  Psychiatric:        Mood and Affect: Mood normal.        Behavior: Behavior normal.       Assessment & Plan:   Problem List Items Addressed This Visit      Cardiovascular and Mediastinum   Essential  hypertension, benign   Relevant Orders   CBC with Differential/Platelet   CMP14+EGFR     Other   Hyperlipidemia with target LDL less than 70   Relevant Orders   Lipid panel   Hyperglycemia   Relevant Orders   Lipid panel    Other Visit Diagnoses    Type 2 diabetes mellitus with other specified complication, with long-term current use of insulin ([place])    -  Primary   Relevant Orders   CBC with Differential/Platelet   Bayer DCA Hb A1c Waived   Flu vaccine need       Relevant Orders   Flu Vaccine QUAD High Dose(Fluad) (Completed)   Urinary frequency  Relevant Orders   CBC with Differential/Platelet   Urinalysis, Complete      Patient appears to be doing well overall. Angina is stable at this time; continue to follow with cardiology. Ddx for increased urinary frequency includes diabetes, UTI, BPH. Will obtain UA and HgA1C today; will treat as indicated. Also obtained CBC, CMP and lipid panel. Continue all current medications. Patient instructed to follow up if symptoms worsen or do not improve.   Follow up plan: Return in about 3 months (around 06/01/2020), or if symptoms worsen or fail to improve, for Diabetes and hypertension and cholesterol..  Counseling provided for all of the vaccine components Orders Placed This Encounter  Procedures  . Flu Vaccine QUAD High Dose(Fluad)  . CBC with Differential/Platelet  . CMP14+EGFR  . Lipid panel  . Bayer DCA Hb A1c Waived  . Urinalysis, Complete   Patient seen and examined with Luella Cook, PA student.  Agree with assessment and plan above.  We will test urine for urinary frequency but likely more chronic than acute.  Will check blood work today but blood pressure looks good, no medication changes at this point. Caryl Pina, MD [facility] 03/02/2020, 10:56 AM

## 2020-03-03 LAB — CBC WITH DIFFERENTIAL/PLATELET
Basophils Absolute: 0.1 10*3/uL (ref 0.0–0.2)
Basos: 1 %
EOS (ABSOLUTE): 0.1 10*3/uL (ref 0.0–0.4)
Eos: 2 %
Hematocrit: 42.2 % (ref 37.5–51.0)
Hemoglobin: 13.7 g/dL (ref 13.0–17.7)
Immature Grans (Abs): 0 10*3/uL (ref 0.0–0.1)
Immature Granulocytes: 1 %
Lymphocytes Absolute: 1.3 10*3/uL (ref 0.7–3.1)
Lymphs: 20 %
MCH: 29.7 pg (ref 26.6–33.0)
MCHC: 32.5 g/dL (ref 31.5–35.7)
MCV: 92 fL (ref 79–97)
Monocytes Absolute: 0.4 10*3/uL (ref 0.1–0.9)
Monocytes: 6 %
Neutrophils Absolute: 4.6 10*3/uL (ref 1.4–7.0)
Neutrophils: 70 %
Platelets: 611 10*3/uL — ABNORMAL HIGH (ref 150–450)
RBC: 4.61 x10E6/uL (ref 4.14–5.80)
RDW: 13.2 % (ref 11.6–15.4)
WBC: 6.6 10*3/uL (ref 3.4–10.8)

## 2020-03-03 LAB — CMP14+EGFR
ALT: 16 IU/L (ref 0–44)
AST: 26 IU/L (ref 0–40)
Albumin/Globulin Ratio: 1.5 (ref 1.2–2.2)
Albumin: 4.3 g/dL (ref 3.6–4.6)
Alkaline Phosphatase: 79 IU/L (ref 44–121)
BUN/Creatinine Ratio: 16 (ref 10–24)
BUN: 20 mg/dL (ref 8–27)
Bilirubin Total: 0.7 mg/dL (ref 0.0–1.2)
CO2: 26 mmol/L (ref 20–29)
Calcium: 9.9 mg/dL (ref 8.6–10.2)
Chloride: 101 mmol/L (ref 96–106)
Creatinine, Ser: 1.27 mg/dL (ref 0.76–1.27)
GFR calc Af Amer: 60 mL/min/{1.73_m2} (ref >59)
GFR calc non Af Amer: 52 mL/min/{1.73_m2} — ABNORMAL LOW (ref >59)
Globulin, Total: 2.8 g/dL (ref 1.5–4.5)
Glucose: 102 mg/dL — ABNORMAL HIGH (ref 65–99)
Potassium: 4.8 mmol/L (ref 3.5–5.2)
Sodium: 140 mmol/L (ref 134–144)
Total Protein: 7.1 g/dL (ref 6.0–8.5)

## 2020-03-03 LAB — LIPID PANEL
Chol/HDL Ratio: 2.3 ratio (ref 0.0–5.0)
Cholesterol, Total: 128 mg/dL (ref 100–199)
HDL: 55 mg/dL (ref >39)
LDL Chol Calc (NIH): 62 mg/dL (ref 0–99)
Triglycerides: 47 mg/dL (ref 0–149)
VLDL Cholesterol Cal: 11 mg/dL (ref 5–40)

## 2020-03-07 NOTE — Addendum Note (Signed)
Addended by: Ladean Raya on: 03/07/2020 04:43 PM   Modules accepted: Orders

## 2020-03-10 ENCOUNTER — Other Ambulatory Visit: Payer: Self-pay | Admitting: Cardiothoracic Surgery

## 2020-03-13 ENCOUNTER — Other Ambulatory Visit: Payer: Self-pay | Admitting: Cardiology

## 2020-03-20 ENCOUNTER — Other Ambulatory Visit: Payer: Self-pay | Admitting: Cardiology

## 2020-03-20 ENCOUNTER — Other Ambulatory Visit: Payer: Self-pay | Admitting: Family Medicine

## 2020-03-20 DIAGNOSIS — I2510 Atherosclerotic heart disease of native coronary artery without angina pectoris: Secondary | ICD-10-CM

## 2020-03-20 DIAGNOSIS — E785 Hyperlipidemia, unspecified: Secondary | ICD-10-CM

## 2020-03-20 DIAGNOSIS — I25118 Atherosclerotic heart disease of native coronary artery with other forms of angina pectoris: Secondary | ICD-10-CM

## 2020-03-23 ENCOUNTER — Other Ambulatory Visit: Payer: Self-pay

## 2020-03-23 ENCOUNTER — Encounter (HOSPITAL_COMMUNITY): Payer: Self-pay | Admitting: Hematology

## 2020-03-23 ENCOUNTER — Inpatient Hospital Stay (HOSPITAL_COMMUNITY): Payer: Medicare HMO

## 2020-03-23 ENCOUNTER — Inpatient Hospital Stay (HOSPITAL_COMMUNITY): Payer: Medicare HMO | Attending: Hematology | Admitting: Hematology

## 2020-03-23 VITALS — BP 141/81 | HR 63 | Temp 96.9°F | Resp 17 | Ht 68.5 in | Wt 165.7 lb

## 2020-03-23 DIAGNOSIS — I2510 Atherosclerotic heart disease of native coronary artery without angina pectoris: Secondary | ICD-10-CM | POA: Insufficient documentation

## 2020-03-23 DIAGNOSIS — D649 Anemia, unspecified: Secondary | ICD-10-CM | POA: Insufficient documentation

## 2020-03-23 DIAGNOSIS — D75839 Thrombocytosis, unspecified: Secondary | ICD-10-CM | POA: Diagnosis not present

## 2020-03-23 DIAGNOSIS — Z888 Allergy status to other drugs, medicaments and biological substances status: Secondary | ICD-10-CM | POA: Diagnosis not present

## 2020-03-23 DIAGNOSIS — I10 Essential (primary) hypertension: Secondary | ICD-10-CM | POA: Diagnosis not present

## 2020-03-23 DIAGNOSIS — D696 Thrombocytopenia, unspecified: Secondary | ICD-10-CM

## 2020-03-23 DIAGNOSIS — Z87891 Personal history of nicotine dependence: Secondary | ICD-10-CM | POA: Insufficient documentation

## 2020-03-23 DIAGNOSIS — R413 Other amnesia: Secondary | ICD-10-CM | POA: Diagnosis not present

## 2020-03-23 DIAGNOSIS — Z809 Family history of malignant neoplasm, unspecified: Secondary | ICD-10-CM | POA: Insufficient documentation

## 2020-03-23 DIAGNOSIS — D75838 Other thrombocytosis: Secondary | ICD-10-CM | POA: Diagnosis not present

## 2020-03-23 DIAGNOSIS — E785 Hyperlipidemia, unspecified: Secondary | ICD-10-CM | POA: Diagnosis not present

## 2020-03-23 DIAGNOSIS — E119 Type 2 diabetes mellitus without complications: Secondary | ICD-10-CM | POA: Insufficient documentation

## 2020-03-23 DIAGNOSIS — Z7902 Long term (current) use of antithrombotics/antiplatelets: Secondary | ICD-10-CM | POA: Insufficient documentation

## 2020-03-23 DIAGNOSIS — Z79899 Other long term (current) drug therapy: Secondary | ICD-10-CM | POA: Diagnosis not present

## 2020-03-23 LAB — COMPREHENSIVE METABOLIC PANEL
ALT: 12 U/L (ref 0–44)
AST: 18 U/L (ref 15–41)
Albumin: 4.1 g/dL (ref 3.5–5.0)
Alkaline Phosphatase: 59 U/L (ref 38–126)
Anion gap: 10 (ref 5–15)
BUN: 19 mg/dL (ref 8–23)
CO2: 30 mmol/L (ref 22–32)
Calcium: 9.3 mg/dL (ref 8.9–10.3)
Chloride: 101 mmol/L (ref 98–111)
Creatinine, Ser: 1.05 mg/dL (ref 0.61–1.24)
GFR, Estimated: >60 mL/min (ref >60)
Glucose, Bld: 98 mg/dL (ref 70–99)
Potassium: 4.3 mmol/L (ref 3.5–5.1)
Sodium: 141 mmol/L (ref 135–145)
Total Bilirubin: 0.8 mg/dL (ref 0.3–1.2)
Total Protein: 7.4 g/dL (ref 6.5–8.1)

## 2020-03-23 LAB — IRON AND TIBC
Iron: 41 ug/dL — ABNORMAL LOW (ref 45–182)
Saturation Ratios: 13 % — ABNORMAL LOW (ref 17.9–39.5)
TIBC: 314 ug/dL (ref 250–450)
UIBC: 273 ug/dL

## 2020-03-23 LAB — LACTATE DEHYDROGENASE: LDH: 155 U/L (ref 98–192)

## 2020-03-23 LAB — CBC WITH DIFFERENTIAL/PLATELET
Abs Immature Granulocytes: 0.02 10*3/uL (ref 0.00–0.07)
Basophils Absolute: 0.1 10*3/uL (ref 0.0–0.1)
Basophils Relative: 1 %
Eosinophils Absolute: 0.3 10*3/uL (ref 0.0–0.5)
Eosinophils Relative: 4 %
HCT: 42.1 % (ref 39.0–52.0)
Hemoglobin: 13 g/dL (ref 13.0–17.0)
Immature Granulocytes: 0 %
Lymphocytes Relative: 23 %
Lymphs Abs: 1.5 10*3/uL (ref 0.7–4.0)
MCH: 29.7 pg (ref 26.0–34.0)
MCHC: 30.9 g/dL (ref 30.0–36.0)
MCV: 96.1 fL (ref 80.0–100.0)
Monocytes Absolute: 0.4 10*3/uL (ref 0.1–1.0)
Monocytes Relative: 7 %
Neutro Abs: 4.2 10*3/uL (ref 1.7–7.7)
Neutrophils Relative %: 65 %
Platelets: 684 10*3/uL — ABNORMAL HIGH (ref 150–400)
RBC: 4.38 MIL/uL (ref 4.22–5.81)
RDW: 13.8 % (ref 11.5–15.5)
WBC: 6.4 10*3/uL (ref 4.0–10.5)
nRBC: 0 % (ref 0.0–0.2)

## 2020-03-23 LAB — SEDIMENTATION RATE: Sed Rate: 4 mm/hr (ref 0–16)

## 2020-03-23 LAB — FERRITIN: Ferritin: 46 ng/mL (ref 24–336)

## 2020-03-23 LAB — C-REACTIVE PROTEIN: CRP: 0.5 mg/dL (ref <1.0)

## 2020-03-23 NOTE — Progress Notes (Signed)
CONSULT NOTE  Patient Care Team: Dettinger, [name], MD as PCP - General (Family Medicine) Adrian Prows, MD as PCP - Cardiology (Cardiology) Adrian Prows, MD as Attending Physician (Cardiology) [place], Heather, OD (Optometry)  CHIEF COMPLAINTS/PURPOSE OF CONSULTATION: Thrombocytosis  HISTORY OF PRESENTING ILLNESS:  Joseph Byrd 86 y.o. male is here because of an isolated elevated platelet count ranging for the past 5-6 years ranging  from 472,000-684,000.  All other labs appear to be normal.  He was referred by his primary care provider Dr. Warrick Parisian for further evaluation.  He has a past medical history significant for hypertension, hyperlipidemia, type 2 diabetes and memory problems.  Currently on Plavix secondary to blocked artery.  He had quadruple bypass back in December 2020 after he was found to have multiple blocked arteries.  On Plavix and aspirin.  Otherwise, he states he is relatively healthy.  He denies any known blood problems.  He denies recent infections or B symptoms including unintentional weight loss, night sweats, appetite changes or fevers.  Denies any enlarged lymph nodes.  He denies any previous abdominal surgeries or concerns with his liver or spleen.  He is a non-smoker.  He denies blood clots.  He denies any flushing or itching.  Denies any recent trauma.  Denies any GI bleeding.  He has minor aches and pains but no swelling.  No history of autoimmune diseases.  He is not aware of any family medical problems.   MEDICAL HISTORY:  Past Medical History:  Diagnosis Date  . Arthritis    "knees, wrists, knuckles" (04/26/2014)  . CAD (coronary artery disease)   . Headache    "had a couple/wk til ~ 2 wks ago" (04/26/2014)  . Hyperlipidemia   . Hypertension   . LBBB (left bundle branch block) 07/06/2018  . PAD (peripheral artery disease) ([place])   . 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 Type II diabetes mellitus ([place])      SURGICAL HISTORY: Past Surgical History:  Procedure Laterality Date  . BACK SURGERY    . CATARACT EXTRACTION W/ INTRAOCULAR LENS  IMPLANT, BILATERAL Bilateral ~ 2014  . CORONARY ANGIOPLASTY WITH STENT PLACEMENT  08/2010; 04/26/2014   "1, LAD; 1"  . CORONARY ARTERY BYPASS GRAFT N/A 05/19/2019   Procedure: CORONARY ARTERY BYPASS GRAFTING (CABG), on pump, times four using left internal mammary artery and right greater saphenous vein harvested endoscopically;  Surgeon: Lajuana Matte, MD;  Location: [facility];  Service: Open Heart Surgery;  Laterality: N/A;  . LEFT HEART CATH AND CORONARY ANGIOGRAPHY N/A 05/18/2019   Procedure: LEFT HEART CATH AND CORONARY ANGIOGRAPHY;  Surgeon: Adrian Prows, MD;  Location: [facility];  Service: Cardiovascular;  Laterality: N/A;  . LEFT HEART CATHETERIZATION WITH CORONARY ANGIOGRAM N/A 04/26/2014   Procedure: LEFT HEART CATHETERIZATION WITH CORONARY ANGIOGRAM;  Surgeon: Laverda Page, MD;  Location: [facility] CATH LAB;  Service: Cardiovascular;  Laterality: N/A;  . LUMBAR LAMINECTOMY/DECOMPRESSION MICRODISCECTOMY  01/2010    SOCIAL HISTORY: Social History   Socioeconomic History  . Marital status: Married    Spouse name: Clarise Cruz  . Number of children: 6  . Years of education: 3  . Highest education level: 3rd grade  Occupational History  . Occupation: retired  Tobacco Use  . Smoking status: Former Smoker    Packs/day: 0.12    Years: 10.00    Pack years: 1.20    Types:  Cigarettes    Quit date: 09/04/1982    Years since quitting: 37.5  . Smokeless tobacco: Never Used  Vaping Use  . Vaping Use: Never used  Substance and Sexual Activity  . Alcohol use: No  . Drug use: No  . Sexual activity: Yes  Other Topics Concern  . Not on file  Social History Narrative  . Not on file   Social Determinants of Health   Financial Resource Strain:   . Difficulty of Paying Living Expenses: Not on file  Food Insecurity:   . Worried About Sales executive in the Last Year: Not on file  . Ran Out of Food in the Last Year: Not on file  Transportation Needs:   . Lack of Transportation (Medical): Not on file  . Lack of Transportation (Non-Medical): Not on file  Physical Activity:   . Days of Exercise per Week: Not on file  . Minutes of Exercise per Session: Not on file  Stress:   . Feeling of Stress : Not on file  Social Connections:   . Frequency of Communication with Friends and Family: Not on file  . Frequency of Social Gatherings with Friends and Family: Not on file  . Attends Religious Services: Not on file  . Active Member of Clubs or Organizations: Not on file  . Attends Archivist Meetings: Not on file  . Marital Status: Not on file  Intimate Partner Violence:   . Fear of Current or Ex-Partner: Not on file  . Emotionally Abused: Not on file  . Physically Abused: Not on file  . Sexually Abused: Not on file    FAMILY HISTORY: Family History  Problem Relation Age of Onset  . Cancer Sister     ALLERGIES:  is allergic to lisinopril.  MEDICATIONS:  Current Outpatient Medications  Medication Sig Dispense Refill  . amLODIPine-Valsartan-HCTZ (EXFORGE HCT) 5-160-25 MG TABS Take 1 tablet by mouth every morning. 30 tablet 2  . aspirin EC 81 MG tablet Take 81 mg by mouth every morning.    Marland Kitchen atorvastatin (LIPITOR) 20 MG tablet TAKE 1 TABLET BY MOUTH EVERY DAY 90 tablet 0  . clopidogrel (PLAVIX) 75 MG tablet TAKE 1 TABLET BY MOUTH EVERY DAY 90 tablet 3  . donepezil (ARICEPT) 10 MG tablet Take 10 mg by mouth at bedtime.     . hydrochlorothiazide (HYDRODIURIL) 25 MG tablet TAKE 1 TABLET BY MOUTH EVERY DAY 90 tablet 3  . metoprolol succinate (TOPROL-XL) 50 MG 24 hr tablet Take 1 tablet (50 mg total) by mouth daily. Take with or immediately following a meal. 90 tablet 3  . Multiple Vitamin (MULTIVITAMIN WITH MINERALS) TABS tablet Take 1 tablet by mouth daily.    Marland Kitchen OLANZapine zydis (ZYPREXA) 5 MG disintegrating tablet Take 1  tablet (5 mg total) by mouth at bedtime. 90 tablet 3  . ONETOUCH VERIO test strip TEST BLOOD SUGAR DAILY DX E11.9 100 strip 3  . Polyethyl Glycol-Propyl Glycol (SYSTANE OP) Place 1 drop into both eyes daily.    Marland Kitchen acetaminophen (TYLENOL) 325 MG tablet Take 650 mg by mouth every 6 (six) hours as needed for moderate pain or headache. (Patient not taking: Reported on 03/23/2020)    . nitroGLYCERIN (NITROSTAT) 0.4 MG SL tablet DISSOLVE 1 TABLET UNDER TONGUE EVERY 5 MIN AS NEEDED FOR CHEST PAIN, MAX 3 DOSES IN 15 MIN (Patient not taking: Reported on 03/23/2020) 25 tablet 1  . tamsulosin (FLOMAX) 0.4 MG CAPS capsule TAKE 1 CAPSULE BY  MOUTH EVERY DAY 90 capsule 0   No current facility-administered medications for this visit.    REVIEW OF SYSTEMS:   Constitutional: Denies fevers, chills or abnormal night sweats Eyes: Denies blurriness of vision, double vision or watery eyes Ears, nose, mouth, throat, and face: Denies mucositis or sore throat Respiratory: Denies cough, dyspnea or wheezes Cardiovascular: Denies palpitation, chest discomfort or lower extremity swelling Gastrointestinal:  Denies nausea, heartburn or change in bowel habits Skin: Denies abnormal skin rashes Lymphatics: Denies new lymphadenopathy or easy bruising Neurological:Denies numbness, tingling or new weaknesses Behavioral/Psych: Mood is stable, no new changes  All other systems were reviewed with the patient and are negative.  PHYSICAL EXAMINATION: ECOG PERFORMANCE STATUS: 1 - Symptomatic but completely ambulatory  Vitals:   03/23/20 0914  BP: (!) 141/81  Pulse: 63  Resp: 17  Temp: (!) 96.9 F (36.1 C)  SpO2: 98%   Filed Weights   03/23/20 0914  Weight: 165 lb 11.2 oz (75.2 kg)    GENERAL:alert, no distress and comfortable SKIN: skin color, texture, turgor are normal, no rashes or significant lesions EYES: normal, conjunctiva are pink and non-injected, sclera clear OROPHARYNX:no exudate, no erythema and lips,  buccal mucosa, and tongue normal  NECK: supple, thyroid normal size, non-tender, without nodularity LYMPH:  no palpable lymphadenopathy in the cervical, axillary or inguinal LUNGS: clear to auscultation and percussion with normal breathing effort HEART: regular rate & rhythm and no murmurs and no lower extremity edema ABDOMEN:abdomen soft, non-tender and normal bowel sounds Musculoskeletal:no cyanosis of digits and no clubbing  PSYCH: alert & oriented x 3 with fluent speech NEURO: no focal motor/sensory deficits  LABORATORY DATA:  I have reviewed the data as listed Recent Results (from the past 2160 hour(s))  CBC with Differential/Platelet     Status: Abnormal   Collection Time: 03/02/20 10:17 AM  Result Value Ref Range   WBC 6.6 3.4 - 10.8 x10E3/uL   RBC 4.61 4.14 - 5.80 x10E6/uL   Hemoglobin 13.7 13.0 - 17.7 g/dL   Hematocrit 42.2 37.5 - 51.0 %   MCV 92 79 - 97 fL   MCH 29.7 26.6 - 33.0 pg   MCHC 32.5 31 - 35 g/dL   RDW 13.2 11.6 - 15.4 %   Platelets 611 (H) 150 - 450 x10E3/uL   Neutrophils 70 Not Estab. %   Lymphs 20 Not Estab. %   Monocytes 6 Not Estab. %   Eos 2 Not Estab. %   Basos 1 Not Estab. %   Neutrophils Absolute 4.6 1.40 - 7.00 x10E3/uL   Lymphocytes Absolute 1.3 0 - 3 x10E3/uL   Monocytes Absolute 0.4 0 - 0 x10E3/uL   EOS (ABSOLUTE) 0.1 0.0 - 0.4 x10E3/uL   Basophils Absolute 0.1 0 - 0 x10E3/uL   Immature Granulocytes 1 Not Estab. %   Immature Grans (Abs) 0.0 0.0 - 0.1 x10E3/uL  CMP14+EGFR     Status: Abnormal   Collection Time: 03/02/20 10:17 AM  Result Value Ref Range   Glucose 102 (H) 65 - 99 mg/dL   BUN 20 8 - 27 mg/dL   Creatinine, Ser 1.27 0.76 - 1.27 mg/dL   GFR calc non Af Amer 52 (L) >59 mL/min/1.73   GFR calc Af Amer 60 >59 mL/min/1.73    Comment: **[facility] currently reports eGFR in compliance with the current**   recommendations of the [employer]. [facility] will   update reporting as new guidelines are published from the NKF-ASN    Task force.  BUN/Creatinine Ratio 16 10 - 24   Sodium 140 134 - 144 mmol/L   Potassium 4.8 3.5 - 5.2 mmol/L   Chloride 101 96 - 106 mmol/L   CO2 26 20 - 29 mmol/L   Calcium 9.9 8.6 - 10.2 mg/dL   Total Protein 7.1 6.0 - 8.5 g/dL   Albumin 4.3 3.6 - 4.6 g/dL   Globulin, Total 2.8 1.5 - 4.5 g/dL   Albumin/Globulin Ratio 1.5 1.2 - 2.2   Bilirubin Total 0.7 0.0 - 1.2 mg/dL   Alkaline Phosphatase 79 44 - 121 IU/L    Comment:               **Please note reference interval change**   AST 26 0 - 40 IU/L   ALT 16 0 - 44 IU/L  Lipid panel     Status: None   Collection Time: 03/02/20 10:17 AM  Result Value Ref Range   Cholesterol, Total 128 100 - 199 mg/dL   Triglycerides 47 0 - 149 mg/dL   HDL 55 >39 mg/dL   VLDL Cholesterol Cal 11 5 - 40 mg/dL   LDL Chol Calc (NIH) 62 0 - 99 mg/dL   Chol/HDL Ratio 2.3 0.0 - 5.0 ratio    Comment:                                   T. Chol/HDL Ratio                                             Men  Women                               1/2 Avg.Risk  3.4    3.3                                   Avg.Risk  5.0    4.4                                2X Avg.Risk  9.6    7.1                                3X Avg.Risk 23.4   11.0   Bayer DCA Hb A1c Waived     Status: None   Collection Time: 03/02/20 10:17 AM  Result Value Ref Range   HB A1C (BAYER DCA - WAIVED) 4.9 <7.0 %    Comment:                                       Diabetic Adult            <7.0                                       Healthy Adult        4.3 - 5.7                                                           (  DCCT/NGSP) American Diabetes Association's Summary of Glycemic Recommendations for Adults with Diabetes: Hemoglobin A1c <7.0%. More stringent glycemic goals (A1c <6.0%) may further reduce complications at the cost of increased risk of hypoglycemia.   Urinalysis, Complete     Status: None   Collection Time: 03/02/20 10:27 AM  Result Value Ref Range   Specific Gravity, UA 1.020 1.005 -  1.030   pH, UA 7.0 5.0 - 7.5   Color, UA Yellow Yellow   Appearance Ur Clear Clear   Leukocytes,UA Negative Negative   Protein,UA Negative Negative/Trace   Glucose, UA Negative Negative   Ketones, UA Negative Negative   RBC, UA Negative Negative   Bilirubin, UA Negative Negative   Urobilinogen, Ur 1.0 0.2 - 1.0 mg/dL   Nitrite, UA Negative Negative   Microscopic Examination See below:   Microscopic Examination     Status: None   Collection Time: 03/02/20 10:27 AM   Urine  Result Value Ref Range   WBC, UA 0-5 0 - 5 /hpf   RBC None seen 0 - 2 /hpf   Epithelial Cells (non renal) 0-10 0 - 10 /hpf   Bacteria, UA None seen None seen/Few  CBC with Differential/Platelet     Status: Abnormal   Collection Time: 03/23/20 10:59 AM  Result Value Ref Range   WBC 6.4 4.0 - 10.5 K/uL   RBC 4.38 4.22 - 5.81 MIL/uL   Hemoglobin 13.0 13.0 - 17.0 g/dL   HCT 42.1 39 - 52 %   MCV 96.1 80.0 - 100.0 fL   MCH 29.7 26.0 - 34.0 pg   MCHC 30.9 30.0 - 36.0 g/dL   RDW 13.8 11.5 - 15.5 %   Platelets 684 (H) 150 - 400 K/uL   nRBC 0.0 0.0 - 0.2 %   Neutrophils Relative % 65 %   Neutro Abs 4.2 1.7 - 7.7 K/uL   Lymphocytes Relative 23 %   Lymphs Abs 1.5 0.7 - 4.0 K/uL   Monocytes Relative 7 %   Monocytes Absolute 0.4 0.1 - 1.0 K/uL   Eosinophils Relative 4 %   Eosinophils Absolute 0.3 0.0 - 0.5 K/uL   Basophils Relative 1 %   Basophils Absolute 0.1 0.0 - 0.1 K/uL   Immature Granulocytes 0 %   Abs Immature Granulocytes 0.02 0.00 - 0.07 K/uL    Comment: Performed at [facility], [address] [place], [place] [address]  Comprehensive metabolic panel     Status: None   Collection Time: 03/23/20 10:59 AM  Result Value Ref Range   Sodium 141 135 - 145 mmol/L   Potassium 4.3 3.5 - 5.1 mmol/L   Chloride 101 98 - 111 mmol/L   CO2 30 22 - 32 mmol/L   Glucose, Bld 98 70 - 99 mg/dL    Comment: Glucose reference range applies only to samples taken after fasting for at least 8 hours.   BUN 19 8 - 23 mg/dL    Creatinine, Ser 1.05 0.61 - 1.24 mg/dL   Calcium 9.3 8.9 - 10.3 mg/dL   Total Protein 7.4 6.5 - 8.1 g/dL   Albumin 4.1 3.5 - 5.0 g/dL   AST 18 15 - 41 U/L   ALT 12 0 - 44 U/L   Alkaline Phosphatase 59 38 - 126 U/L   Total Bilirubin 0.8 0.3 - 1.2 mg/dL   GFR, Estimated >60 >60 mL/min    Comment: (NOTE) Calculated using the CKD-EPI Creatinine Equation (2021)    Anion gap 10 5 - 15  Comment: Performed at [facility], [address], [place], [place] [address]  Pathologist smear review     Status: None   Collection Time: 03/23/20 10:59 AM  Result Value Ref Range   Path Review Reviewed by Kalman Drape, M.D.     Comment: 10.22.2021 THROMBOCYTOSIS Performed at [facility], [place] [address] [place], [place] [address]   Ferritin     Status: None   Collection Time: 03/23/20 10:59 AM  Result Value Ref Range   Ferritin 46 24 - 336 ng/mL    Comment: Performed at [facility], [address], [place], [place] [address]  Iron and TIBC     Status: Abnormal   Collection Time: 03/23/20 10:59 AM  Result Value Ref Range   Iron 41 (L) 45 - 182 ug/dL   TIBC 314 250 - 450 ug/dL   Saturation Ratios 13 (L) 17.9 - 39.5 %   UIBC 273 ug/dL    Comment: Performed at [facility], [address] [place], [place] [address]  Sedimentation rate     Status: None   Collection Time: 03/23/20 10:59 AM  Result Value Ref Range   Sed Rate 4 0 - 16 mm/hr    Comment: Performed at [facility], [address] [place], [place] [address]  C-reactive protein     Status: None   Collection Time: 03/23/20 10:59 AM  Result Value Ref Range   CRP 0.5 <1.0 mg/dL    Comment: Performed at [facility], [place], [address], [place], [place] [address]  Lactate dehydrogenase     Status: None   Collection Time: 03/23/20 10:59 AM  Result Value Ref Range   LDH 155 98 - 192 U/L    Comment: Performed at [facility], [address], [place], [place] [address]    RADIOGRAPHIC STUDIES: I have personally  reviewed the radiological images as listed and agreed with the findings in the report. No results found.  ASSESSMENT & PLAN:  Isolated thrombocytosis: -Platelet count has been elevated at least for the past 5-6 years ranging from 472,000-684,000.  Noted to have a normal platelet count back in 2012. -No other lab abnormalities.  Noted to have a low hemoglobin (7.7-normal) from 2012 to 05/2019.  -No family history of clotting or bleeding problems. -No personal history of blood clots or bleeding.  -No history of iron deficiency.  Not on oral iron supplementation. -No B symptoms.  No unexplained vasomotor symptoms.  No splenomegaly. -No smoking history. -No suspect medications. -No connective tissue disorders.  Plan: Isolated thrombocytosis: -Will repeat lab work today. -On chart review, patient has been anemic in the past with hemoglobins as low as 7.7. -Had a colonoscopy back in 2009.  Noted to have diverticulosis and a polyp. -Will recheck CBC, CMP, ferritin and iron levels.   -Will also rule out vitamin deficiencies-copper, vitamin B12 and folate. -We will check JAK2, CA LR and MPL to rule out myeloproliferative neoplasm and other hematology malignancies. -We will check ESR CRP and LDH to rule out inflammation.  Often times these markers are elevated with reactive thrombocytosis and normal with autonomous thrombocytosis. -We will have him return in 2 to 3 weeks for lab results and assessment.  Type 2 diabetes: -Stable.  A1c 4.9.  Hyperlipidemia: -Atorvastatin stable.  Hypertension: -Stable on amlodipine valsartan and hydrochlorothiazide -Blood pressure stable.  CAD: -Status post quadruple bypass surgery back in December 2020. -Currently on aspirin and Plavix.  Disposition: Return to clinic in 2 to 3 weeks for  assessment and results.  Addendum: I have independently interviewed and examined this patient and agree with HPI written by my nurse practitioner Faythe Casa, NP.   We will evaluate for myeloproliferative disorders by checking JAK2 V6 1 7 F and reflex testing for other mutations.  We will also consider BCR/ABL if JAK2 mutation is negative.  We will check for other labs for reactive thrombocytosis.   No problem-specific Assessment & Plan notes found for this encounter.   All questions were answered. The patient knows to call the clinic with any problems, questions or concerns.      Derek Jack, MD 03/27/20 7:46 PM

## 2020-03-24 LAB — PATHOLOGIST SMEAR REVIEW

## 2020-03-27 ENCOUNTER — Other Ambulatory Visit: Payer: Self-pay | Admitting: Nurse Practitioner

## 2020-03-27 DIAGNOSIS — D473 Essential (hemorrhagic) thrombocythemia: Secondary | ICD-10-CM | POA: Insufficient documentation

## 2020-03-27 DIAGNOSIS — D75839 Thrombocytosis, unspecified: Secondary | ICD-10-CM | POA: Insufficient documentation

## 2020-03-28 ENCOUNTER — Other Ambulatory Visit: Payer: Self-pay

## 2020-03-28 DIAGNOSIS — I10 Essential (primary) hypertension: Secondary | ICD-10-CM

## 2020-03-28 MED ORDER — AMLODIPINE-VALSARTAN-HCTZ 5-160-25 MG PO TABS: 1.0000 | ORAL_TABLET | ORAL | 2 refills | Status: DC

## 2020-03-30 LAB — JAK2 EXONS 12-15

## 2020-03-31 LAB — MPL MUTATION ANALYSIS

## 2020-04-03 LAB — CALRETICULIN (CALR) MUTATION ANALYSIS

## 2020-04-10 ENCOUNTER — Telehealth: Payer: Self-pay

## 2020-04-10 NOTE — Telephone Encounter (Signed)
[facility] to see if they had the Amlodipine-Valsartan-HCTZ 5-160-25 mg in stock, pharmacist informed me that this was not available anymore. I called pt's wife back to let her know this and she would have to call the cardiologist to see what he wanted to prescribe to replace this.

## 2020-04-10 NOTE — Telephone Encounter (Signed)
  Prescription Request  04/10/2020  What is the name of the medication or equipment? B/p meds--?  Have you contacted your pharmacy to request a refill? (if applicable) yes  Which pharmacy would you like this sent to? [facility]   Patient notified that their request is being sent to the clinical staff for review and that they should receive a response within 2 business days.   Dettinger's pt  Please call pt bc he has been out of his b/p meds for about a month now per wife bc [facility].

## 2020-04-11 ENCOUNTER — Inpatient Hospital Stay (HOSPITAL_COMMUNITY): Payer: Medicare HMO | Attending: Hematology | Admitting: Hematology

## 2020-04-11 VITALS — BP 120/73 | HR 65 | Temp 97.3°F | Resp 18 | Wt 162.8 lb

## 2020-04-11 DIAGNOSIS — I10 Essential (primary) hypertension: Secondary | ICD-10-CM | POA: Insufficient documentation

## 2020-04-11 DIAGNOSIS — I447 Left bundle-branch block, unspecified: Secondary | ICD-10-CM | POA: Diagnosis not present

## 2020-04-11 DIAGNOSIS — I739 Peripheral vascular disease, unspecified: Secondary | ICD-10-CM | POA: Diagnosis not present

## 2020-04-11 DIAGNOSIS — E785 Hyperlipidemia, unspecified: Secondary | ICD-10-CM | POA: Insufficient documentation

## 2020-04-11 DIAGNOSIS — M129 Arthropathy, unspecified: Secondary | ICD-10-CM | POA: Diagnosis not present

## 2020-04-11 DIAGNOSIS — D75839 Thrombocytosis, unspecified: Secondary | ICD-10-CM

## 2020-04-11 DIAGNOSIS — Z79899 Other long term (current) drug therapy: Secondary | ICD-10-CM | POA: Insufficient documentation

## 2020-04-11 DIAGNOSIS — D75838 Other thrombocytosis: Secondary | ICD-10-CM | POA: Insufficient documentation

## 2020-04-11 DIAGNOSIS — I2510 Atherosclerotic heart disease of native coronary artery without angina pectoris: Secondary | ICD-10-CM | POA: Insufficient documentation

## 2020-04-11 DIAGNOSIS — E119 Type 2 diabetes mellitus without complications: Secondary | ICD-10-CM | POA: Insufficient documentation

## 2020-04-11 DIAGNOSIS — Z87891 Personal history of nicotine dependence: Secondary | ICD-10-CM | POA: Insufficient documentation

## 2020-04-11 MED ORDER — HYDROXYUREA 500 MG PO CAPS: 500.0000 mg | ORAL_CAPSULE | Freq: Every day | ORAL | 5 refills | Status: DC

## 2020-04-11 NOTE — Patient Instructions (Signed)
[place] at [facility] Discharge Instructions  You were seen today by Dr. Delton Coombes. 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 You will be prescribed Hydrea to take 1 tablet every day. Your next appointment will be with the nurse practitioner in 1 month for labs and follow up.   Thank you for choosing [place] at [facility] to provide your oncology and hematology care.  To afford each patient quality time with our provider, please arrive at least 15 minutes before your scheduled appointment time.   If you have a lab appointment with the [facility] please come in thru the Main Entrance and check in at the main information desk  You need to re-schedule your appointment should you arrive 10 or more minutes late.  We strive to give you quality time with our providers, and arriving late affects you and other patients whose appointments are after yours.  Also, if you no show three or more times for appointments you may be dismissed from the clinic at the providers discretion.     Again, thank you for choosing [facility].  Our hope is that these requests will decrease the amount of time that you wait before being seen by our physicians.       _____________________________________________________________  Should you have questions after your visit to [facility], please contact our office at [phone] between the hours of 8:00 a.m. and 4:30 p.m.  Voicemails left after 4:00 p.m. will not be returned until the following business day.  For prescription refill requests, have your pharmacy contact our office and allow 72 hours.    Cancer Center Support Programs:   > Cancer Support Group  2nd Tuesday of the month 1pm-2pm, Journey Room

## 2020-04-11 NOTE — Progress Notes (Signed)
Joseph Byrd, [place] [address]   CLINIC:  Medical Oncology/Hematology  PCP:  Dettinger, [name], MD [place] / [place] [address]  [phone]  REASON FOR VISIT:  Essential thrombocytosis  PRIOR THERAPY: None  CURRENT THERAPY: Hydrea to begin  INTERVAL HISTORY:  [name], a 86 y.o. male, returns for routine follow-up for his thrombocytosis. Praveen was last seen on 03/23/2020.  Today he is accompanied by his wife and he reports feeling well. He denies history of blood clots. He takes ASA 81 every morning.  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   REVIEW OF SYSTEMS:  Review of Systems  Constitutional: Positive for fatigue (75%). Negative for appetite change.  Genitourinary: Positive for frequency.   All other systems reviewed and are negative.   PAST MEDICAL/SURGICAL HISTORY:  Past Medical History:  Diagnosis Date  . Arthritis    "knees, wrists, knuckles" (04/26/2014)  . CAD (coronary artery disease)   . Headache    "had a couple/wk til ~ 2 wks ago" (04/26/2014)  . Hyperlipidemia   . Hypertension   . LBBB (left bundle branch block) 07/06/2018  . PAD (peripheral artery disease) ([place])   . 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 Type II diabetes mellitus ([place])    Past Surgical History:  Procedure Laterality Date  . BACK SURGERY    . CATARACT EXTRACTION W/ INTRAOCULAR LENS  IMPLANT, BILATERAL Bilateral ~ 2014  . CORONARY ANGIOPLASTY WITH STENT PLACEMENT  08/2010; 04/26/2014   "1, LAD; 1"  . CORONARY ARTERY BYPASS GRAFT N/A 05/19/2019   Procedure: CORONARY ARTERY BYPASS GRAFTING (CABG), on pump, times four using left internal mammary artery and right greater saphenous vein harvested endoscopically;  Surgeon: Lajuana Matte, MD;  Location: [facility];  Service: Open Heart Surgery;  Laterality: N/A;  . LEFT HEART CATH AND  CORONARY ANGIOGRAPHY N/A 05/18/2019   Procedure: LEFT HEART CATH AND CORONARY ANGIOGRAPHY;  Surgeon: Adrian Prows, MD;  Location: [facility];  Service: Cardiovascular;  Laterality: N/A;  . LEFT HEART CATHETERIZATION WITH CORONARY ANGIOGRAM N/A 04/26/2014   Procedure: LEFT HEART CATHETERIZATION WITH CORONARY ANGIOGRAM;  Surgeon: Laverda Page, MD;  Location: [facility] CATH LAB;  Service: Cardiovascular;  Laterality: N/A;  . LUMBAR LAMINECTOMY/DECOMPRESSION MICRODISCECTOMY  01/2010    SOCIAL HISTORY:  Social History   Socioeconomic History  . Marital status: Married    Spouse name: Clarise Cruz  . Number of children: 6  . Years of education: 3  . Highest education level: 3rd grade  Occupational History  . Occupation: retired  Tobacco Use  . Smoking status: Former Smoker    Packs/day: 0.12    Years: 10.00    Pack years: 1.20    Types: Cigarettes    Quit date: 09/04/1982    Years since quitting: 37.6  . Smokeless tobacco: Never Used  Vaping Use  . Vaping Use: Never used  Substance and Sexual Activity  . Alcohol use: No  . Drug use: No  . Sexual activity: Yes  Other Topics Concern  . Not on file  Social History Narrative  . Not on file   Social Determinants of Health   Financial Resource Strain: Low Risk   . Difficulty of Paying Living Expenses: Not hard at all  Food Insecurity: No Food Insecurity  .  Worried About Charity fundraiser in the Last Year: Never true  . Ran Out of Food in the Last Year: Never true  Transportation Needs: No Transportation Needs  . Lack of Transportation (Medical): No  . Lack of Transportation (Non-Medical): No  Physical Activity: Inactive  . Days of Exercise per Week: 0 days  . Minutes of Exercise per Session: 0 min  Stress: No Stress Concern Present  . Feeling of Stress : Not at all  Social Connections: Moderately Isolated  . Frequency of Communication with Friends and Family: Twice a week  . Frequency of Social Gatherings with Friends and  Family: Never  . Attends Religious Services: More than 4 times per year  . Active Member of Clubs or Organizations: No  . Attends Archivist Meetings: Never  . Marital Status: Married  Human resources officer Violence: Not At Risk  . Fear of Current or Ex-Partner: No  . Emotionally Abused: No  . Physically Abused: No  . Sexually Abused: No    FAMILY HISTORY:  Family History  Problem Relation Age of Onset  . Cancer Sister     CURRENT MEDICATIONS:  Current Outpatient Medications  Medication Sig Dispense Refill  . acetaminophen (TYLENOL) 325 MG tablet Take 650 mg by mouth every 6 (six) hours as needed for moderate pain or headache.     Marland Kitchen amLODIPine-Valsartan-HCTZ (EXFORGE HCT) 5-160-25 MG TABS Take 1 tablet by mouth every morning. 30 tablet 2  . aspirin EC 81 MG tablet Take 81 mg by mouth every morning.    Marland Kitchen atorvastatin (LIPITOR) 20 MG tablet TAKE 1 TABLET BY MOUTH EVERY DAY 90 tablet 0  . clopidogrel (PLAVIX) 75 MG tablet TAKE 1 TABLET BY MOUTH EVERY DAY 90 tablet 3  . donepezil (ARICEPT) 10 MG tablet Take 10 mg by mouth at bedtime.     . hydrochlorothiazide (HYDRODIURIL) 25 MG tablet TAKE 1 TABLET BY MOUTH EVERY DAY 90 tablet 3  . Multiple Vitamin (MULTIVITAMIN WITH MINERALS) TABS tablet Take 1 tablet by mouth daily.    Marland Kitchen OLANZapine zydis (ZYPREXA) 5 MG disintegrating tablet Take 1 tablet (5 mg total) by mouth at bedtime. 90 tablet 3  . ONETOUCH VERIO test strip TEST BLOOD SUGAR DAILY DX E11.9 100 strip 3  . Polyethyl Glycol-Propyl Glycol (SYSTANE OP) Place 1 drop into both eyes daily.    . tamsulosin (FLOMAX) 0.4 MG CAPS capsule TAKE 1 CAPSULE BY MOUTH EVERY DAY 90 capsule 0  . metoprolol succinate (TOPROL-XL) 50 MG 24 hr tablet Take 1 tablet (50 mg total) by mouth daily. Take with or immediately following a meal. (Patient not taking: Reported on 04/11/2020) 90 tablet 3  . nitroGLYCERIN (NITROSTAT) 0.4 MG SL tablet DISSOLVE 1 TABLET UNDER TONGUE EVERY 5 MIN AS NEEDED FOR CHEST  PAIN, MAX 3 DOSES IN 15 MIN (Patient not taking: Reported on 04/11/2020) 25 tablet 1   No current facility-administered medications for this visit.    ALLERGIES:  Allergies  Allergen Reactions  . Lisinopril     Patient says no drug allergies    PHYSICAL EXAM:  Performance status (ECOG): 1 - Symptomatic but completely ambulatory  Vitals:   04/11/20 1344  BP: 120/73  Pulse: 65  Resp: 18  Temp: (!) 97.3 F (36.3 C)  SpO2: 99%   Wt Readings from Last 3 Encounters:  04/11/20 162 lb 12.8 oz (73.8 kg)  03/23/20 165 lb 11.2 oz (75.2 kg)  03/02/20 165 lb (74.8 kg)   Physical Exam Vitals reviewed.  Constitutional:      Appearance: Normal appearance.  Cardiovascular:     Rate and Rhythm: Normal rate and regular rhythm.     Pulses: Normal pulses.     Heart sounds: Normal heart sounds.  Pulmonary:     Effort: Pulmonary effort is normal.     Breath sounds: Normal breath sounds.  Abdominal:     Palpations: Abdomen is soft. There is no mass.     Tenderness: There is no abdominal tenderness.  Neurological:     General: No focal deficit present.     Mental Status: He is alert and oriented to person, place, and time.  Psychiatric:        Mood and Affect: Mood normal.        Behavior: Behavior normal.     LABORATORY DATA:  I have reviewed the labs as listed.  CBC Latest Ref Rng & Units 03/23/2020 03/02/2020 07/26/2019  WBC 4.0 - 10.5 K/uL 6.4 6.6 5.7  Hemoglobin 13.0 - 17.0 g/dL 13.0 13.7 11.8(L)  Hematocrit 39 - 52 % 42.1 42.2 38.3  Platelets 150 - 400 K/uL 684(H) 611(H) 548(H)   CMP Latest Ref Rng & Units 03/23/2020 03/02/2020 07/26/2019  Glucose 70 - 99 mg/dL 98 102(H) 119(H)  BUN 8 - 23 mg/dL 19 20 16   Creatinine 0.61 - 1.24 mg/dL 1.05 1.27 1.01  Sodium 135 - 145 mmol/L 141 140 143  Potassium 3.5 - 5.1 mmol/L 4.3 4.8 3.9  Chloride 98 - 111 mmol/L 101 101 106  CO2 22 - 32 mmol/L 30 26 23   Calcium 8.9 - 10.3 mg/dL 9.3 9.9 9.5  Total Protein 6.5 - 8.1 g/dL 7.4 7.1 -    Total Bilirubin 0.3 - 1.2 mg/dL 0.8 0.7 -  Alkaline Phos 38 - 126 U/L 59 79 -  AST 15 - 41 U/L 18 26 -  ALT 0 - 44 U/L 12 16 -      Component Value Date/Time   RBC 4.38 03/23/2020 1059   MCV 96.1 03/23/2020 1059   MCV 92 03/02/2020 1017   MCH 29.7 03/23/2020 1059   MCHC 30.9 03/23/2020 1059   RDW 13.8 03/23/2020 1059   RDW 13.2 03/02/2020 1017   LYMPHSABS 1.5 03/23/2020 1059   LYMPHSABS 1.3 03/02/2020 1017   MONOABS 0.4 03/23/2020 1059   EOSABS 0.3 03/23/2020 1059   EOSABS 0.1 03/02/2020 1017   BASOSABS 0.1 03/23/2020 1059   BASOSABS 0.1 03/02/2020 1017    DIAGNOSTIC IMAGING:  I have independently reviewed the scans and discussed with the patient. No results found.   ASSESSMENT:  1.  Essential thrombocytosis: -Platelet count has been elevated at least for the past 5-6 years ranging from 472,000-684,000.  Noted to have a normal platelet count back in 2012. -No other lab abnormalities.  Noted to have a low hemoglobin (7.7-normal) from 2012 to 05/2019.  -No history of connective tissue disorders.  No prior history of thrombosis.  No smoking history.  2.  Hypertension: -Continue amlodipine, valsartan and HCTZ.  3.  CAD: -Status post quadruple bypass surgery in December 2020. -Continue aspirin and Plavix.   PLAN:  1.  Essential thrombocytosis: -We have reviewed labs from 03/23/2020.  Platelet count is 684.  Ferritin is 46.  LDH normal. 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  We have discussed his new diagnosis in detail. -Because of his advanced age, I have recommended cytoreduction with target platelet count below 400 K.  This is to minimize thromboembolism.  He  will continue aspirin. -We talked about side effects of hydroxyurea in detail. -We will start him at 500 mg daily. -He will follow up with [place] in 4 weeks.    Orders placed this encounter:  No orders of the defined types were placed in this  encounter.    Derek Jack, MD [place] [phone]   I, Milinda Antis, am acting as a scribe for Dr. Sanda Linger.  I, Derek Jack MD, have reviewed the above documentation for accuracy and completeness, and I agree with the above.

## 2020-04-12 ENCOUNTER — Other Ambulatory Visit: Payer: Self-pay

## 2020-04-12 DIAGNOSIS — I10 Essential (primary) hypertension: Secondary | ICD-10-CM

## 2020-04-12 MED ORDER — AMLODIPINE-VALSARTAN-HCTZ 5-160-25 MG PO TABS: 1.0000 | ORAL_TABLET | ORAL | 0 refills | Status: DC

## 2020-04-17 ENCOUNTER — Other Ambulatory Visit: Payer: Self-pay

## 2020-04-17 DIAGNOSIS — I10 Essential (primary) hypertension: Secondary | ICD-10-CM

## 2020-04-17 MED ORDER — AMLODIPINE-VALSARTAN-HCTZ 5-160-25 MG PO TABS: 1.0000 | ORAL_TABLET | ORAL | 0 refills | Status: DC

## 2020-04-19 ENCOUNTER — Telehealth: Payer: Self-pay

## 2020-04-19 NOTE — Telephone Encounter (Signed)
Patient's wife called that [facility] I called the pharmacy they got a shipment today but they're short staff therefore they haven't been able to check I asked patient's wife if she wanted to use another pharmacy or wait till the pharmacy check patient doesn't want to go through all that she just wants you to send a new medication for patient please advise

## 2020-04-20 NOTE — Telephone Encounter (Signed)
When I called the pharmacy they separated all three he is taking HCTZ as well do you want him to be on all three please advise

## 2020-04-20 NOTE — Telephone Encounter (Signed)
Send the medication separately Amlo and Val same dose he is on for 90 tablets with 3 refills

## 2020-04-21 NOTE — Telephone Encounter (Signed)
YES

## 2020-04-21 NOTE — Telephone Encounter (Signed)
[facility] yesterday they separated all three meds

## 2020-04-29 IMAGING — NM NM MYOCAR MULTI W/SPECT W/WALL MOTION & EF
2 series · 12 of 12 positions shown · non-contrast
Comparison: none

[Series 1: rest · 6.51mm/px · 6 of 64 frames shown]
[frame 6/64]
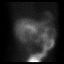
[frame 16/64]
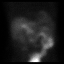
[frame 27/64]
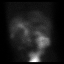
[frame 38/64]
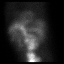
[frame 48/64]
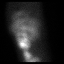
[frame 59/64]
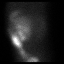

[Series 3: stress gated - perfusion · 6.51mm/px · 6 of 64 frames shown]
[frame 6/64]
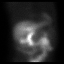
[frame 16/64]
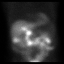
[frame 27/64]
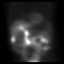
[frame 38/64]
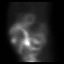
[frame 48/64]
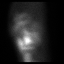
[frame 59/64]
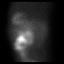

[12 of 12 positions shown; findings below may reference images not displayed]

Canned report from images found in remote index.

Refer to host system for actual result text.

## 2020-05-11 ENCOUNTER — Inpatient Hospital Stay (HOSPITAL_COMMUNITY): Payer: Medicare HMO

## 2020-05-11 ENCOUNTER — Ambulatory Visit (HOSPITAL_COMMUNITY): Payer: Medicare HMO | Admitting: Oncology

## 2020-06-01 ENCOUNTER — Ambulatory Visit (INDEPENDENT_AMBULATORY_CARE_PROVIDER_SITE_OTHER): Payer: Medicare HMO | Admitting: Family Medicine

## 2020-06-01 ENCOUNTER — Encounter: Payer: Self-pay | Admitting: Family Medicine

## 2020-06-01 ENCOUNTER — Other Ambulatory Visit: Payer: Self-pay

## 2020-06-01 ENCOUNTER — Telehealth: Payer: Self-pay

## 2020-06-01 VITALS — BP 129/70 | HR 63 | Ht 68.5 in | Wt 169.0 lb

## 2020-06-01 DIAGNOSIS — Z794 Long term (current) use of insulin: Secondary | ICD-10-CM | POA: Diagnosis not present

## 2020-06-01 DIAGNOSIS — I10 Essential (primary) hypertension: Secondary | ICD-10-CM

## 2020-06-01 DIAGNOSIS — E785 Hyperlipidemia, unspecified: Secondary | ICD-10-CM | POA: Diagnosis not present

## 2020-06-01 DIAGNOSIS — E1169 Type 2 diabetes mellitus with other specified complication: Secondary | ICD-10-CM

## 2020-06-01 DIAGNOSIS — R739 Hyperglycemia, unspecified: Secondary | ICD-10-CM

## 2020-06-01 LAB — BAYER DCA HB A1C WAIVED: HB A1C (BAYER DCA - WAIVED): 4.8 % (ref <7.0)

## 2020-06-01 NOTE — Telephone Encounter (Signed)
Medication is already on patient's medication list.

## 2020-06-01 NOTE — Progress Notes (Signed)
BP 129/70   Pulse 63   Ht 5' 8.5" (1.74 m)   Wt 169 lb (76.7 kg)   SpO2 98%   BMI 25.32 kg/m    Subjective:   Patient ID: Joseph Byrd, male    DOB: 10-21-1933, 86 y.o.   MRN: [id number]  HPI: Joseph Byrd is a 86 y.o. male presenting on 06/01/2020 for Medical Management of Chronic Issues, Diabetes, Hypertension, and Hyperlipidemia   HPI Type 2 diabetes mellitus Patient comes in today for recheck of his diabetes. Patient has been currently taking no medication and has been diet controlled, A1c is 4.8 so essentially not diabetes any further.. Patient is currently on an ACE inhibitor/ARB. Patient has seen an ophthalmologist this year. Patient denies any issues with their feet. The symptom started onset as an adult hypertension hyperlipidemia ARE RELATED TO DM   Hypertension Patient is currently on amlodipine-valsartan-hydrochlorothiazide and metoprolol, and their blood pressure today is 129/70. Patient denies any lightheadedness or dizziness. Patient denies headaches, blurred vision, chest pains, shortness of breath, or weakness. Denies any side effects from medication and is content with current medication.   Hyperlipidemia Patient is coming in for recheck of his hyperlipidemia. The patient is currently taking atorvastatin. They deny any issues with myalgias or history of liver damage from it. They deny any focal numbness or weakness or chest pain.   Relevant past medical, surgical, family and social history reviewed and updated as indicated. Interim medical history since our last visit reviewed. Allergies and medications reviewed and updated.  Review of Systems  Constitutional: Negative for chills and fever.  Eyes: Negative for visual disturbance.  Respiratory: Negative for shortness of breath and wheezing.   Cardiovascular: Negative for chest pain and leg swelling.  Musculoskeletal: Negative for back pain and gait problem.  Skin: Negative for rash.  Neurological:  Negative for dizziness and headaches.  All other systems reviewed and are negative.   Per HPI unless specifically indicated above   Allergies as of 06/01/2020      Reactions   Lisinopril    Patient says no drug allergies      Medication List       Accurate as of June 01, 2020  2:28 PM. If you have any questions, ask your nurse or doctor.        acetaminophen 325 MG tablet Commonly known as: TYLENOL Take 650 mg by mouth every 6 (six) hours as needed for moderate pain or headache.   amLODIPine-Valsartan-HCTZ 5-160-25 MG Tabs Commonly known as: Exforge HCT Take 1 tablet by mouth every morning.   aspirin EC 81 MG tablet Take 81 mg by mouth every morning.   atorvastatin 20 MG tablet Commonly known as: LIPITOR TAKE 1 TABLET BY MOUTH EVERY DAY   clopidogrel 75 MG tablet Commonly known as: PLAVIX TAKE 1 TABLET BY MOUTH EVERY DAY   donepezil 10 MG tablet Commonly known as: ARICEPT Take 10 mg by mouth at bedtime.   hydrochlorothiazide 25 MG tablet Commonly known as: HYDRODIURIL TAKE 1 TABLET BY MOUTH EVERY DAY   hydroxyurea 500 MG capsule Commonly known as: HYDREA Take 1 capsule (500 mg total) by mouth daily. May take with food to minimize GI side effects.   metoprolol succinate 50 MG 24 hr tablet Commonly known as: TOPROL-XL Take 1 tablet (50 mg total) by mouth daily. Take with or immediately following a meal.   multivitamin with minerals Tabs tablet Take 1 tablet by mouth daily.   nitroGLYCERIN 0.4 MG SL tablet Commonly  known as: NITROSTAT DISSOLVE 1 TABLET UNDER TONGUE EVERY 5 MIN AS NEEDED FOR CHEST PAIN, MAX 3 DOSES IN 15 MIN   OLANZapine zydis 5 MG disintegrating tablet Commonly known as: ZYPREXA Take 1 tablet (5 mg total) by mouth at bedtime.   OneTouch Verio test strip Generic drug: glucose blood TEST BLOOD SUGAR DAILY DX E11.9   SYSTANE OP Place 1 drop into both eyes daily.   tamsulosin 0.4 MG Caps capsule Commonly known as: FLOMAX TAKE 1  CAPSULE BY MOUTH EVERY DAY        Objective:   BP 129/70   Pulse 63   Ht 5' 8.5" (1.74 m)   Wt 169 lb (76.7 kg)   SpO2 98%   BMI 25.32 kg/m   Wt Readings from Last 3 Encounters:  06/01/20 169 lb (76.7 kg)  04/11/20 162 lb 12.8 oz (73.8 kg)  03/23/20 165 lb 11.2 oz (75.2 kg)    Physical Exam Vitals and nursing note reviewed.  Constitutional:      General: He is not in acute distress.    Appearance: He is well-developed and well-nourished. He is not diaphoretic.  Eyes:     General: No scleral icterus.    Extraocular Movements: EOM normal.     Conjunctiva/sclera: Conjunctivae normal.  Neck:     Thyroid: No thyromegaly.  Cardiovascular:     Rate and Rhythm: Normal rate and regular rhythm.     Pulses: Intact distal pulses.     Heart sounds: Normal heart sounds. No murmur heard.   Pulmonary:     Effort: Pulmonary effort is normal. No respiratory distress.     Breath sounds: Normal breath sounds. No wheezing.  Musculoskeletal:        General: No edema. Normal range of motion.     Cervical back: Neck supple.  Lymphadenopathy:     Cervical: No cervical adenopathy.  Skin:    General: Skin is warm and dry.     Findings: No rash.  Neurological:     Mental Status: He is alert and oriented to person, place, and time.     Coordination: Coordination normal.  Psychiatric:        Mood and Affect: Mood and affect normal.        Behavior: Behavior normal.       Assessment & Plan:   Problem List Items Addressed This Visit      Cardiovascular and Mediastinum   Essential hypertension, benign     Other   Hyperlipidemia with target LDL less than 70   Hyperglycemia    Other Visit Diagnoses    Type 2 diabetes mellitus with other specified complication, with long-term current use of insulin ([place])    -  Primary   Relevant Orders   Bayer DCA Hb A1c Waived      Continue current medication, A1c looks great at 4.8.  He is essentially not diabetes and I will take it off his  list.  Blood pressure looks great.  No changes for now. Follow up plan: Return in about 6 months (around 11/30/2020), or if symptoms worsen or fail to improve, for Diabetes hypertension and cholesterol.  Counseling provided for all of the vaccine components Orders Placed This Encounter  Procedures  . Bayer [facility] Hb A1c [place], MD [facility] 06/01/2020, 2:28 PM

## 2020-06-14 ENCOUNTER — Other Ambulatory Visit: Payer: Self-pay | Admitting: Family Medicine

## 2020-06-14 DIAGNOSIS — I2510 Atherosclerotic heart disease of native coronary artery without angina pectoris: Secondary | ICD-10-CM

## 2020-06-14 DIAGNOSIS — E785 Hyperlipidemia, unspecified: Secondary | ICD-10-CM

## 2020-06-19 ENCOUNTER — Ambulatory Visit (HOSPITAL_COMMUNITY): Payer: Medicare HMO | Admitting: Hematology

## 2020-06-19 ENCOUNTER — Other Ambulatory Visit (HOSPITAL_COMMUNITY): Payer: Medicare HMO

## 2020-06-21 ENCOUNTER — Other Ambulatory Visit: Payer: Self-pay | Admitting: Family Medicine

## 2020-07-04 DIAGNOSIS — B351 Tinea unguium: Secondary | ICD-10-CM | POA: Diagnosis not present

## 2020-07-04 DIAGNOSIS — M79676 Pain in unspecified toe(s): Secondary | ICD-10-CM | POA: Diagnosis not present

## 2020-07-19 ENCOUNTER — Other Ambulatory Visit: Payer: Self-pay

## 2020-07-19 ENCOUNTER — Inpatient Hospital Stay (HOSPITAL_COMMUNITY): Payer: Medicare HMO | Attending: Hematology and Oncology

## 2020-07-19 ENCOUNTER — Inpatient Hospital Stay (HOSPITAL_COMMUNITY): Payer: Medicare HMO | Admitting: Hematology

## 2020-07-19 VITALS — BP 115/67 | HR 53 | Temp 97.0°F | Resp 16 | Wt 164.8 lb

## 2020-07-19 DIAGNOSIS — D473 Essential (hemorrhagic) thrombocythemia: Secondary | ICD-10-CM

## 2020-07-19 DIAGNOSIS — M129 Arthropathy, unspecified: Secondary | ICD-10-CM | POA: Insufficient documentation

## 2020-07-19 DIAGNOSIS — E785 Hyperlipidemia, unspecified: Secondary | ICD-10-CM | POA: Diagnosis not present

## 2020-07-19 DIAGNOSIS — I739 Peripheral vascular disease, unspecified: Secondary | ICD-10-CM | POA: Insufficient documentation

## 2020-07-19 DIAGNOSIS — D75839 Thrombocytosis, unspecified: Secondary | ICD-10-CM

## 2020-07-19 DIAGNOSIS — E119 Type 2 diabetes mellitus without complications: Secondary | ICD-10-CM | POA: Insufficient documentation

## 2020-07-19 DIAGNOSIS — I2510 Atherosclerotic heart disease of native coronary artery without angina pectoris: Secondary | ICD-10-CM | POA: Diagnosis not present

## 2020-07-19 DIAGNOSIS — Z79899 Other long term (current) drug therapy: Secondary | ICD-10-CM | POA: Insufficient documentation

## 2020-07-19 DIAGNOSIS — I10 Essential (primary) hypertension: Secondary | ICD-10-CM | POA: Insufficient documentation

## 2020-07-19 DIAGNOSIS — Z87891 Personal history of nicotine dependence: Secondary | ICD-10-CM | POA: Insufficient documentation

## 2020-07-19 LAB — CBC WITH DIFFERENTIAL/PLATELET
Abs Immature Granulocytes: 0.03 10*3/uL (ref 0.00–0.07)
Basophils Absolute: 0.1 10*3/uL (ref 0.0–0.1)
Basophils Relative: 1 %
Eosinophils Absolute: 0.1 10*3/uL (ref 0.0–0.5)
Eosinophils Relative: 2 %
HCT: 39.8 % (ref 39.0–52.0)
Hemoglobin: 12.5 g/dL — ABNORMAL LOW (ref 13.0–17.0)
Immature Granulocytes: 1 %
Lymphocytes Relative: 24 %
Lymphs Abs: 1.4 10*3/uL (ref 0.7–4.0)
MCH: 34.2 pg — ABNORMAL HIGH (ref 26.0–34.0)
MCHC: 31.4 g/dL (ref 30.0–36.0)
MCV: 109 fL — ABNORMAL HIGH (ref 80.0–100.0)
Monocytes Absolute: 0.3 10*3/uL (ref 0.1–1.0)
Monocytes Relative: 6 %
Neutro Abs: 3.8 10*3/uL (ref 1.7–7.7)
Neutrophils Relative %: 66 %
Platelets: 521 10*3/uL — ABNORMAL HIGH (ref 150–400)
RBC: 3.65 MIL/uL — ABNORMAL LOW (ref 4.22–5.81)
RDW: 14 % (ref 11.5–15.5)
WBC: 5.7 10*3/uL (ref 4.0–10.5)
nRBC: 0 % (ref 0.0–0.2)

## 2020-07-19 LAB — COMPREHENSIVE METABOLIC PANEL
ALT: 14 U/L (ref 0–44)
AST: 22 U/L (ref 15–41)
Albumin: 4 g/dL (ref 3.5–5.0)
Alkaline Phosphatase: 61 U/L (ref 38–126)
Anion gap: 4 — ABNORMAL LOW (ref 5–15)
BUN: 25 mg/dL — ABNORMAL HIGH (ref 8–23)
CO2: 26 mmol/L (ref 22–32)
Calcium: 8.8 mg/dL — ABNORMAL LOW (ref 8.9–10.3)
Chloride: 107 mmol/L (ref 98–111)
Creatinine, Ser: 1.09 mg/dL (ref 0.61–1.24)
GFR, Estimated: >60 mL/min (ref >60)
Glucose, Bld: 121 mg/dL — ABNORMAL HIGH (ref 70–99)
Potassium: 3.9 mmol/L (ref 3.5–5.1)
Sodium: 137 mmol/L (ref 135–145)
Total Bilirubin: 0.7 mg/dL (ref 0.3–1.2)
Total Protein: 7.3 g/dL (ref 6.5–8.1)

## 2020-07-19 MED ORDER — HYDROXYUREA 500 MG PO CAPS: 500.0000 mg | ORAL_CAPSULE | Freq: Every day | ORAL | 5 refills | Status: DC

## 2020-07-19 NOTE — Patient Instructions (Signed)
[place] at [facility] Discharge Instructions  You were seen today by Dr. Delton Coombes. He went over your recent results. Start taking 2 tablets of Hydrea on Monday, Wednesday, and Friday, and the rest of the week take 1 tablet daily. Dr. Delton Coombes will see you back in 6 weeks for labs and follow up.   Thank you for choosing [place] at [facility] to provide your oncology and hematology care.  To afford each patient quality time with our provider, please arrive at least 15 minutes before your scheduled appointment time.   If you have a lab appointment with the [facility] please come in thru the Main Entrance and check in at the main information desk  You need to re-schedule your appointment should you arrive 10 or more minutes late.  We strive to give you quality time with our providers, and arriving late affects you and other patients whose appointments are after yours.  Also, if you no show three or more times for appointments you may be dismissed from the clinic at the providers discretion.     Again, thank you for choosing [facility].  Our hope is that these requests will decrease the amount of time that you wait before being seen by our physicians.       _____________________________________________________________  Should you have questions after your visit to [facility], please contact our office at [phone] between the hours of 8:00 a.m. and 4:30 p.m.  Voicemails left after 4:00 p.m. will not be returned until the following business day.  For prescription refill requests, have your pharmacy contact our office and allow 72 hours.    Cancer Center Support Programs:   > Cancer Support Group  2nd Tuesday of the month 1pm-2pm, Journey Room

## 2020-07-19 NOTE — Progress Notes (Signed)
[facility] [place], [place] [address]   CLINIC:  Medical Oncology/Hematology  PCP:  Joseph Byrd, Joseph Kaufmann, MD Joseph Byrd / [place] [address]  [phone]  REASON FOR VISIT:  Follow-up for essential thrombocytosis  PRIOR THERAPY: None  CURRENT THERAPY: Hydrea 500 mg daily  INTERVAL HISTORY:  Joseph Byrd, a 87 y.o. male, returns for routine follow-up for his essential thrombocytosis. Joseph Byrd was last seen on 04/11/2020.  Today he is accompanied by his wife and he reports feeling well. He continues taking Hydrea 500 mg daily and is tolerating it well. He is also taking ASA 81 and denies having nosebleeds, hematochezia, hematuria or skin rashes.   REVIEW OF SYSTEMS:  Review of Systems  Constitutional: Positive for fatigue (75%). Negative for appetite change.  HENT:   Negative for nosebleeds.   Gastrointestinal: Negative for blood in stool.  Genitourinary: Negative for hematuria.   Skin: Negative for rash.  Neurological: Positive for headaches.  All other systems reviewed and are negative.   PAST MEDICAL/SURGICAL HISTORY:  Past Medical History:  Diagnosis Date  . Arthritis    "knees, wrists, knuckles" (04/26/2014)  . CAD (coronary artery disease)   . Headache    "had a couple/wk til ~ 2 wks ago" (04/26/2014)  . Hyperlipidemia   . Hypertension   . LBBB (left bundle branch block) 07/06/2018  . PAD (peripheral artery disease) ([place])   . 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 Type II diabetes mellitus ([place])    Past Surgical History:  Procedure Laterality Date  . BACK SURGERY    . CATARACT EXTRACTION W/ INTRAOCULAR LENS  IMPLANT, BILATERAL Bilateral ~ 2014  . CORONARY ANGIOPLASTY WITH STENT PLACEMENT  08/2010; 04/26/2014   "1, LAD; 1"  . CORONARY ARTERY BYPASS GRAFT N/A 05/19/2019   Procedure: CORONARY ARTERY BYPASS GRAFTING (CABG), on pump, times four using left internal mammary  artery and right greater saphenous vein harvested endoscopically;  Surgeon: Lajuana Matte, MD;  Location: [facility];  Service: Open Heart Surgery;  Laterality: N/A;  . LEFT HEART CATH AND CORONARY ANGIOGRAPHY N/A 05/18/2019   Procedure: LEFT HEART CATH AND CORONARY ANGIOGRAPHY;  Surgeon: Adrian Prows, MD;  Location: [facility];  Service: Cardiovascular;  Laterality: N/A;  . LEFT HEART CATHETERIZATION WITH CORONARY ANGIOGRAM N/A 04/26/2014   Procedure: LEFT HEART CATHETERIZATION WITH CORONARY ANGIOGRAM;  Surgeon: Laverda Page, MD;  Location: [facility] CATH LAB;  Service: Cardiovascular;  Laterality: N/A;  . LUMBAR LAMINECTOMY/DECOMPRESSION MICRODISCECTOMY  01/2010    SOCIAL HISTORY:  Social History   Socioeconomic History  . Marital status: Married    Spouse name: Joseph Byrd  . Number of children: 6  . Years of education: 3  . Highest education level: 3rd grade  Occupational History  . Occupation: retired  Tobacco Use  . Smoking status: Former Smoker    Packs/day: 0.12    Years: 10.00    Pack years: 1.20    Types: Cigarettes    Quit date: 09/04/1982    Years since quitting: 37.8  . Smokeless tobacco: Never Used  Vaping Use  . Vaping Use: Never used  Substance and Sexual Activity  . Alcohol use: No  . Drug use: No  . Sexual activity: Yes  Other Topics Concern  . Not on file  Social History Narrative  . Not on file   Social Determinants  of Health   Financial Resource Strain: Low Risk   . Difficulty of Paying Living Expenses: Not hard at all  Food Insecurity: No Food Insecurity  . Worried About Charity fundraiser in the Last Year: Never true  . Ran Out of Food in the Last Year: Never true  Transportation Needs: No Transportation Needs  . Lack of Transportation (Medical): No  . Lack of Transportation (Non-Medical): No  Physical Activity: Inactive  . Days of Exercise per Week: 0 days  . Minutes of Exercise per Session: 0 min  Stress: No Stress Concern Present  . Feeling  of Stress : Not at all  Social Connections: Moderately Isolated  . Frequency of Communication with Friends and Family: Twice a week  . Frequency of Social Gatherings with Friends and Family: Never  . Attends Religious Services: More than 4 times per year  . Active Member of Clubs or Organizations: No  . Attends Archivist Meetings: Never  . Marital Status: Married  Human resources officer Violence: Not At Risk  . Fear of Current or Ex-Partner: No  . Emotionally Abused: No  . Physically Abused: No  . Sexually Abused: No    FAMILY HISTORY:  Family History  Problem Relation Age of Onset  . Cancer Sister     CURRENT MEDICATIONS:  Current Outpatient Medications  Medication Sig Dispense Refill  . amLODipine (NORVASC) 5 MG tablet Take 5 mg by mouth every morning.    Marland Kitchen amLODIPine-Valsartan-HCTZ (EXFORGE HCT) 5-160-25 MG TABS Take 1 tablet by mouth every morning. 90 tablet 0  . aspirin EC 81 MG tablet Take 81 mg by mouth every morning.    Marland Kitchen atorvastatin (LIPITOR) 20 MG tablet TAKE 1 TABLET BY MOUTH EVERY DAY 90 tablet 1  . clopidogrel (PLAVIX) 75 MG tablet TAKE 1 TABLET BY MOUTH EVERY DAY 90 tablet 3  . donepezil (ARICEPT) 10 MG tablet Take 10 mg by mouth at bedtime.     . hydrochlorothiazide (HYDRODIURIL) 25 MG tablet TAKE 1 TABLET BY MOUTH EVERY DAY 90 tablet 3  . metoprolol succinate (TOPROL-XL) 50 MG 24 hr tablet Take 1 tablet (50 mg total) by mouth daily. Take with or immediately following a meal. 90 tablet 3  . Multiple Vitamin (MULTIVITAMIN WITH MINERALS) TABS tablet Take 1 tablet by mouth daily.    . nitroGLYCERIN (NITROSTAT) 0.4 MG SL tablet DISSOLVE 1 TABLET UNDER TONGUE EVERY 5 MIN AS NEEDED FOR CHEST PAIN, MAX 3 DOSES IN 15 MIN 25 tablet 1  . OLANZapine zydis (ZYPREXA) 5 MG disintegrating tablet Take 1 tablet (5 mg total) by mouth at bedtime. 90 tablet 3  . ONETOUCH VERIO test strip TEST BLOOD SUGAR DAILY DX E11.9 100 strip 3  . Polyethyl Glycol-Propyl Glycol (SYSTANE OP)  Place 1 drop into both eyes daily.    . tamsulosin (FLOMAX) 0.4 MG CAPS capsule TAKE 1 CAPSULE BY MOUTH EVERY DAY 90 capsule 0  . valsartan (DIOVAN) 160 MG tablet Take 160 mg by mouth every morning.    Marland Kitchen acetaminophen (TYLENOL) 325 MG tablet Take 650 mg by mouth every 6 (six) hours as needed for moderate pain or headache.  (Patient not taking: Reported on 07/19/2020)    . hydroxyurea (HYDREA) 500 MG capsule Take 1 capsule (500 mg total) by mouth daily. 2 tablets on Monday, Wednesday and Friday and 1 tablet daily rest of the week. 45 capsule 5   No current facility-administered medications for this visit.    ALLERGIES:  Allergies  Allergen  Reactions  . Lisinopril     Patient says no drug allergies    PHYSICAL EXAM:  Performance status (ECOG): 1 - Symptomatic but completely ambulatory  Vitals:   07/19/20 1448  BP: 115/67  Pulse: (!) 53  Resp: 16  Temp: (!) 97 F (36.1 C)  SpO2: 99%   Wt Readings from Last 3 Encounters:  07/19/20 164 lb 12.8 oz (74.8 kg)  06/01/20 169 lb (76.7 kg)  04/11/20 162 lb 12.8 oz (73.8 kg)   Physical Exam Vitals reviewed.  Constitutional:      Appearance: Normal appearance.  Cardiovascular:     Rate and Rhythm: Normal rate and regular rhythm.     Pulses: Normal pulses.     Heart sounds: Normal heart sounds.  Pulmonary:     Effort: Pulmonary effort is normal.     Breath sounds: Normal breath sounds.  Musculoskeletal:     Right lower leg: No edema.     Left lower leg: No edema.  Neurological:     General: No focal deficit present.     Mental Status: He is alert and oriented to person, place, and time.  Psychiatric:        Mood and Affect: Mood normal.        Behavior: Behavior normal.     LABORATORY DATA:  I have reviewed the labs as listed.  CBC Latest Ref Rng & Units 07/19/2020 03/23/2020 03/02/2020  WBC 4.0 - 10.5 K/uL 5.7 6.4 6.6  Hemoglobin 13.0 - 17.0 g/dL 12.5(L) 13.0 13.7  Hematocrit 39.0 - 52.0 % 39.8 42.1 42.2  Platelets 150 -  400 K/uL 521(H) 684(H) 611(H)   CMP Latest Ref Rng & Units 07/19/2020 03/23/2020 03/02/2020  Glucose 70 - 99 mg/dL 121(H) 98 102(H)  BUN 8 - 23 mg/dL 25(H) 19 20  Creatinine 0.61 - 1.24 mg/dL 1.09 1.05 1.27  Sodium 135 - 145 mmol/L 137 141 140  Potassium 3.5 - 5.1 mmol/L 3.9 4.3 4.8  Chloride 98 - 111 mmol/L 107 101 101  CO2 22 - 32 mmol/L 26 30 26   Calcium 8.9 - 10.3 mg/dL 8.8(L) 9.3 9.9  Total Protein 6.5 - 8.1 g/dL 7.3 7.4 7.1  Total Bilirubin 0.3 - 1.2 mg/dL 0.7 0.8 0.7  Alkaline Phos 38 - 126 U/L 61 59 79  AST 15 - 41 U/L 22 18 26   ALT 0 - 44 U/L 14 12 16       Component Value Date/Time   RBC 3.65 (L) 07/19/2020 1338   MCV 109.0 (H) 07/19/2020 1338   MCV 92 03/02/2020 1017   MCH 34.2 (H) 07/19/2020 1338   MCHC 31.4 07/19/2020 1338   RDW 14.0 07/19/2020 1338   RDW 13.2 03/02/2020 1017   LYMPHSABS 1.4 07/19/2020 1338   LYMPHSABS 1.3 03/02/2020 1017   MONOABS 0.3 07/19/2020 1338   EOSABS 0.1 07/19/2020 1338   EOSABS 0.1 03/02/2020 1017   BASOSABS 0.1 07/19/2020 1338   BASOSABS 0.1 03/02/2020 1017    DIAGNOSTIC IMAGING:  I have independently reviewed the scans and discussed with the patient. No results found.   ASSESSMENT:  1.  Essential thrombocytosis: -Platelet count has been elevated at least for the past5-6years ranging from 472,000-684,000.Noted to have a normal platelet count back in 2012. -No other lab abnormalities. Noted to have a low hemoglobin (7.7-normal) from 2012 to 05/2019. -No history of connective tissue disorders.  No prior history of thrombosis.  No smoking history. -JAK2 exon 12-15 mutation was positive consistent with myeloproliferative disorder. -Hydroxyurea  1 tablet daily started around April 11, 2020.  2.  CAD: -Status post quadruple bypass surgery in December 2020.    PLAN:  1.  Essential thrombocytosis: -He is tolerating hydroxyurea 1 tablet daily without any problems. -No aquagenic pruritus or vasomotor symptoms. -Reviewed labs  from today.  Platelet count is 521, improved from 684 prior to start of hydroxyurea. -Target platelet count is below 400.  Continue aspirin 81 mg daily. -We will increase hydroxyurea to 2 tablets on Mondays, Wednesdays and Fridays and 1 tablet rest of the week. -RTC 6 weeks for follow-up with labs.  2.  Hypertension: -Continue amlodipine, valsartan and HCTZ.  Blood pressure is well controlled.  3.  CAD: -Continue atorvastatin, aspirin and Plavix.  Orders placed this encounter:  No orders of the defined types were placed in this encounter.    Derek Jack, MD [place] [phone]   I, Milinda Antis, am acting as a scribe for Dr. Sanda Linger.  I, Derek Jack MD, have reviewed the above documentation for accuracy and completeness, and I agree with the above.

## 2020-08-04 DIAGNOSIS — N400 Enlarged prostate without lower urinary tract symptoms: Secondary | ICD-10-CM | POA: Diagnosis not present

## 2020-08-04 DIAGNOSIS — I10 Essential (primary) hypertension: Secondary | ICD-10-CM | POA: Diagnosis not present

## 2020-08-04 DIAGNOSIS — I739 Peripheral vascular disease, unspecified: Secondary | ICD-10-CM | POA: Diagnosis not present

## 2020-08-04 DIAGNOSIS — I25119 Atherosclerotic heart disease of native coronary artery with unspecified angina pectoris: Secondary | ICD-10-CM | POA: Diagnosis not present

## 2020-08-04 DIAGNOSIS — E663 Overweight: Secondary | ICD-10-CM | POA: Diagnosis not present

## 2020-08-04 DIAGNOSIS — Z6825 Body mass index (BMI) 25.0-25.9, adult: Secondary | ICD-10-CM | POA: Diagnosis not present

## 2020-08-04 DIAGNOSIS — D75839 Thrombocytosis, unspecified: Secondary | ICD-10-CM | POA: Diagnosis not present

## 2020-08-04 DIAGNOSIS — I252 Old myocardial infarction: Secondary | ICD-10-CM | POA: Diagnosis not present

## 2020-08-04 DIAGNOSIS — G309 Alzheimer's disease, unspecified: Secondary | ICD-10-CM | POA: Diagnosis not present

## 2020-08-04 DIAGNOSIS — E785 Hyperlipidemia, unspecified: Secondary | ICD-10-CM | POA: Diagnosis not present

## 2020-08-08 DIAGNOSIS — Z029 Encounter for administrative examinations, unspecified: Secondary | ICD-10-CM

## 2020-09-04 ENCOUNTER — Encounter (HOSPITAL_COMMUNITY): Payer: Self-pay | Admitting: Hematology

## 2020-09-04 ENCOUNTER — Inpatient Hospital Stay (HOSPITAL_COMMUNITY): Payer: Medicare HMO

## 2020-09-04 ENCOUNTER — Other Ambulatory Visit: Payer: Self-pay

## 2020-09-04 ENCOUNTER — Inpatient Hospital Stay (HOSPITAL_COMMUNITY): Payer: Medicare HMO | Attending: Hematology | Admitting: Hematology

## 2020-09-04 VITALS — BP 120/70 | HR 64 | Temp 97.0°F | Resp 18 | Wt 170.7 lb

## 2020-09-04 DIAGNOSIS — I10 Essential (primary) hypertension: Secondary | ICD-10-CM | POA: Diagnosis not present

## 2020-09-04 DIAGNOSIS — D473 Essential (hemorrhagic) thrombocythemia: Secondary | ICD-10-CM

## 2020-09-04 DIAGNOSIS — E1151 Type 2 diabetes mellitus with diabetic peripheral angiopathy without gangrene: Secondary | ICD-10-CM | POA: Insufficient documentation

## 2020-09-04 DIAGNOSIS — Z87891 Personal history of nicotine dependence: Secondary | ICD-10-CM | POA: Insufficient documentation

## 2020-09-04 DIAGNOSIS — R5383 Other fatigue: Secondary | ICD-10-CM | POA: Diagnosis not present

## 2020-09-04 DIAGNOSIS — D75839 Thrombocytosis, unspecified: Secondary | ICD-10-CM

## 2020-09-04 DIAGNOSIS — M129 Arthropathy, unspecified: Secondary | ICD-10-CM | POA: Diagnosis not present

## 2020-09-04 DIAGNOSIS — Z7982 Long term (current) use of aspirin: Secondary | ICD-10-CM | POA: Insufficient documentation

## 2020-09-04 DIAGNOSIS — Z79899 Other long term (current) drug therapy: Secondary | ICD-10-CM | POA: Insufficient documentation

## 2020-09-04 DIAGNOSIS — E785 Hyperlipidemia, unspecified: Secondary | ICD-10-CM | POA: Diagnosis not present

## 2020-09-04 DIAGNOSIS — I447 Left bundle-branch block, unspecified: Secondary | ICD-10-CM | POA: Diagnosis not present

## 2020-09-04 DIAGNOSIS — I2510 Atherosclerotic heart disease of native coronary artery without angina pectoris: Secondary | ICD-10-CM | POA: Insufficient documentation

## 2020-09-04 LAB — COMPREHENSIVE METABOLIC PANEL
ALT: 13 U/L (ref 0–44)
AST: 20 U/L (ref 15–41)
Albumin: 3.9 g/dL (ref 3.5–5.0)
Alkaline Phosphatase: 51 U/L (ref 38–126)
Anion gap: 9 (ref 5–15)
BUN: 23 mg/dL (ref 8–23)
CO2: 27 mmol/L (ref 22–32)
Calcium: 8.9 mg/dL (ref 8.9–10.3)
Chloride: 104 mmol/L (ref 98–111)
Creatinine, Ser: 1.09 mg/dL (ref 0.61–1.24)
GFR, Estimated: >60 mL/min (ref >60)
Glucose, Bld: 113 mg/dL — ABNORMAL HIGH (ref 70–99)
Potassium: 3.9 mmol/L (ref 3.5–5.1)
Sodium: 140 mmol/L (ref 135–145)
Total Bilirubin: 1.2 mg/dL (ref 0.3–1.2)
Total Protein: 7.2 g/dL (ref 6.5–8.1)

## 2020-09-04 LAB — CBC WITH DIFFERENTIAL/PLATELET
Abs Immature Granulocytes: 0.02 10*3/uL (ref 0.00–0.07)
Basophils Absolute: 0.1 10*3/uL (ref 0.0–0.1)
Basophils Relative: 1 %
Eosinophils Absolute: 0.1 10*3/uL (ref 0.0–0.5)
Eosinophils Relative: 2 %
HCT: 38.4 % — ABNORMAL LOW (ref 39.0–52.0)
Hemoglobin: 12.3 g/dL — ABNORMAL LOW (ref 13.0–17.0)
Immature Granulocytes: 0 %
Lymphocytes Relative: 29 %
Lymphs Abs: 1.7 10*3/uL (ref 0.7–4.0)
MCH: 35.2 pg — ABNORMAL HIGH (ref 26.0–34.0)
MCHC: 32 g/dL (ref 30.0–36.0)
MCV: 110 fL — ABNORMAL HIGH (ref 80.0–100.0)
Monocytes Absolute: 0.4 10*3/uL (ref 0.1–1.0)
Monocytes Relative: 8 %
Neutro Abs: 3.6 10*3/uL (ref 1.7–7.7)
Neutrophils Relative %: 60 %
Platelets: 445 10*3/uL — ABNORMAL HIGH (ref 150–400)
RBC: 3.49 MIL/uL — ABNORMAL LOW (ref 4.22–5.81)
RDW: 13.3 % (ref 11.5–15.5)
WBC: 5.9 10*3/uL (ref 4.0–10.5)
nRBC: 0 % (ref 0.0–0.2)

## 2020-09-04 NOTE — Progress Notes (Signed)
[facility] [place], [place] [address]   CLINIC:  Medical Oncology/Hematology  PCP:  Byrd, Joseph Kaufmann, MD [facility] / [place] [address]  [phone]  REASON FOR VISIT:  Follow-up for essential thrombocytosis  PRIOR THERAPY: None  CURRENT THERAPY: Hydrea 1,000 mg on M/W/F and 500 mg rest of week  INTERVAL HISTORY:  [name], a 87 y.o. male, returns for routine follow-up for his essential thrombocytosis. Joseph Byrd was last seen on 07/19/2020.  Today he is accompanied by his wife and he reports feeling well. He is taking Hydrea 1,000 mg on M/W/F and 1 week the rest of the days and denies having N/V/D or skin rashes.   REVIEW OF SYSTEMS:  Review of Systems  Constitutional: Positive for fatigue (75%). Negative for appetite change.  Gastrointestinal: Negative for diarrhea, nausea and vomiting.  Skin: Negative for rash.  All other systems reviewed and are negative.   PAST MEDICAL/SURGICAL HISTORY:  Past Medical History:  Diagnosis Date  . Arthritis    "knees, wrists, knuckles" (04/26/2014)  . CAD (coronary artery disease)   . Headache    "had a couple/wk til ~ 2 wks ago" (04/26/2014)  . Hyperlipidemia   . Hypertension   . LBBB (left bundle branch block) 07/06/2018  . PAD (peripheral artery disease) ([place])   . 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 Type II diabetes mellitus ([place])    Past Surgical History:  Procedure Laterality Date  . BACK SURGERY    . CATARACT EXTRACTION W/ INTRAOCULAR LENS  IMPLANT, BILATERAL Bilateral ~ 2014  . CORONARY ANGIOPLASTY WITH STENT PLACEMENT  08/2010; 04/26/2014   "1, LAD; 1"  . CORONARY ARTERY BYPASS GRAFT N/A 05/19/2019   Procedure: CORONARY ARTERY BYPASS GRAFTING (CABG), on pump, times four using left internal mammary artery and right greater saphenous vein harvested endoscopically;  Surgeon: Lajuana Matte, MD;  Location: [facility];   Service: Open Heart Surgery;  Laterality: N/A;  . LEFT HEART CATH AND CORONARY ANGIOGRAPHY N/A 05/18/2019   Procedure: LEFT HEART CATH AND CORONARY ANGIOGRAPHY;  Surgeon: Adrian Prows, MD;  Location: [facility];  Service: Cardiovascular;  Laterality: N/A;  . LEFT HEART CATHETERIZATION WITH CORONARY ANGIOGRAM N/A 04/26/2014   Procedure: LEFT HEART CATHETERIZATION WITH CORONARY ANGIOGRAM;  Surgeon: Laverda Page, MD;  Location: [facility] CATH LAB;  Service: Cardiovascular;  Laterality: N/A;  . LUMBAR LAMINECTOMY/DECOMPRESSION MICRODISCECTOMY  01/2010    SOCIAL HISTORY:  Social History   Socioeconomic History  . Marital status: Married    Spouse name: Joseph Byrd  . Number of children: 6  . Years of education: 3  . Highest education level: 3rd grade  Occupational History  . Occupation: retired  Tobacco Use  . Smoking status: Former Smoker    Packs/day: 0.12    Years: 10.00    Pack years: 1.20    Types: Cigarettes    Quit date: 09/04/1982    Years since quitting: 38.0  . Smokeless tobacco: Never Used  Vaping Use  . Vaping Use: Never used  Substance and Sexual Activity  . Alcohol use: No  . Drug use: No  . Sexual activity: Yes  Other Topics Concern  . Not on file  Social History Narrative  . Not on file   Social Determinants of Health   Financial Resource Strain: Low Risk   . Difficulty of Paying  Living Expenses: Not hard at all  Food Insecurity: No Food Insecurity  . Worried About Charity fundraiser in the Last Year: Never true  . Ran Out of Food in the Last Year: Never true  Transportation Needs: No Transportation Needs  . Lack of Transportation (Medical): No  . Lack of Transportation (Non-Medical): No  Physical Activity: Inactive  . Days of Exercise per Week: 0 days  . Minutes of Exercise per Session: 0 min  Stress: No Stress Concern Present  . Feeling of Stress : Not at all  Social Connections: Moderately Isolated  . Frequency of Communication with Friends and Family:  Twice a week  . Frequency of Social Gatherings with Friends and Family: Never  . Attends Religious Services: More than 4 times per year  . Active Member of Clubs or Organizations: No  . Attends Archivist Meetings: Never  . Marital Status: Married  Human resources officer Violence: Not At Risk  . Fear of Current or Ex-Partner: No  . Emotionally Abused: No  . Physically Abused: No  . Sexually Abused: No    FAMILY HISTORY:  Family History  Problem Relation Age of Onset  . Cancer Sister     CURRENT MEDICATIONS:  Current Outpatient Medications  Medication Sig Dispense Refill  . acetaminophen (TYLENOL) 325 MG tablet Take 650 mg by mouth every 6 (six) hours as needed for moderate pain or headache.    Marland Kitchen amLODipine (NORVASC) 5 MG tablet Take 5 mg by mouth every morning.    Marland Kitchen amLODIPine-Valsartan-HCTZ (EXFORGE HCT) 5-160-25 MG TABS Take 1 tablet by mouth every morning. 90 tablet 0  . aspirin EC 81 MG tablet Take 81 mg by mouth every morning.    Marland Kitchen atorvastatin (LIPITOR) 20 MG tablet TAKE 1 TABLET BY MOUTH EVERY DAY 90 tablet 1  . clopidogrel (PLAVIX) 75 MG tablet TAKE 1 TABLET BY MOUTH EVERY DAY 90 tablet 3  . donepezil (ARICEPT) 10 MG tablet Take 10 mg by mouth at bedtime.     . hydrochlorothiazide (HYDRODIURIL) 25 MG tablet TAKE 1 TABLET BY MOUTH EVERY DAY 90 tablet 3  . hydroxyurea (HYDREA) 500 MG capsule Take 1 capsule (500 mg total) by mouth daily. 2 tablets on Monday, Wednesday and Friday and 1 tablet daily rest of the week. 45 capsule 5  . metoprolol succinate (TOPROL-XL) 50 MG 24 hr tablet Take 1 tablet (50 mg total) by mouth daily. Take with or immediately following a meal. 90 tablet 3  . Multiple Vitamin (MULTIVITAMIN WITH MINERALS) TABS tablet Take 1 tablet by mouth daily.    Marland Kitchen OLANZapine zydis (ZYPREXA) 5 MG disintegrating tablet Take 1 tablet (5 mg total) by mouth at bedtime. 90 tablet 3  . Polyethyl Glycol-Propyl Glycol (SYSTANE OP) Place 1 drop into both eyes daily.    .  tamsulosin (FLOMAX) 0.4 MG CAPS capsule TAKE 1 CAPSULE BY MOUTH EVERY DAY 90 capsule 0  . valsartan (DIOVAN) 160 MG tablet Take 160 mg by mouth every morning.    . nitroGLYCERIN (NITROSTAT) 0.4 MG SL tablet DISSOLVE 1 TABLET UNDER TONGUE EVERY 5 MIN AS NEEDED FOR CHEST PAIN, MAX 3 DOSES IN 15 MIN (Patient not taking: Reported on 09/04/2020) 25 tablet 1  . ONETOUCH VERIO test strip TEST BLOOD SUGAR DAILY DX E11.9 100 strip 3   No current facility-administered medications for this visit.    ALLERGIES:  Allergies  Allergen Reactions  . Lisinopril     Patient says no drug allergies  PHYSICAL EXAM:  Performance status (ECOG): 1 - Symptomatic but completely ambulatory  Vitals:   09/04/20 1515  BP: 120/70  Pulse: 64  Resp: 18  Temp: (!) 97 F (36.1 C)  SpO2: 96%   Wt Readings from Last 3 Encounters:  09/04/20 170 lb 11.2 oz (77.4 kg)  07/19/20 164 lb 12.8 oz (74.8 kg)  06/01/20 169 lb (76.7 kg)   Physical Exam Vitals reviewed.  Constitutional:      Appearance: Normal appearance.  Cardiovascular:     Rate and Rhythm: Normal rate and regular rhythm.     Pulses: Normal pulses.     Heart sounds: Normal heart sounds.  Pulmonary:     Effort: Pulmonary effort is normal.     Breath sounds: Normal breath sounds.  Abdominal:     Palpations: Abdomen is soft. There is no splenomegaly or mass.     Tenderness: There is no abdominal tenderness.     Hernia: No hernia is present.  Musculoskeletal:     Right lower leg: No edema.     Left lower leg: No edema.  Neurological:     General: No focal deficit present.     Mental Status: He is alert and oriented to person, place, and time.  Psychiatric:        Mood and Affect: Mood normal.        Behavior: Behavior normal.     LABORATORY DATA:  I have reviewed the labs as listed.  CBC Latest Ref Rng & Units 09/04/2020 07/19/2020 03/23/2020  WBC 4.0 - 10.5 K/uL 5.9 5.7 6.4  Hemoglobin 13.0 - 17.0 g/dL 12.3(L) 12.5(L) 13.0  Hematocrit 39.0  - 52.0 % 38.4(L) 39.8 42.1  Platelets 150 - 400 K/uL 445(H) 521(H) 684(H)   CMP Latest Ref Rng & Units 09/04/2020 07/19/2020 03/23/2020  Glucose 70 - 99 mg/dL 113(H) 121(H) 98  BUN 8 - 23 mg/dL 23 25(H) 19  Creatinine 0.61 - 1.24 mg/dL 1.09 1.09 1.05  Sodium 135 - 145 mmol/L 140 137 141  Potassium 3.5 - 5.1 mmol/L 3.9 3.9 4.3  Chloride 98 - 111 mmol/L 104 107 101  CO2 22 - 32 mmol/L 27 26 30   Calcium 8.9 - 10.3 mg/dL 8.9 8.8(L) 9.3  Total Protein 6.5 - 8.1 g/dL 7.2 7.3 7.4  Total Bilirubin 0.3 - 1.2 mg/dL 1.2 0.7 0.8  Alkaline Phos 38 - 126 U/L 51 61 59  AST 15 - 41 U/L 20 22 18   ALT 0 - 44 U/L 13 14 12       Component Value Date/Time   RBC 3.49 (L) 09/04/2020 1358   MCV 110.0 (H) 09/04/2020 1358   MCV 92 03/02/2020 1017   MCH 35.2 (H) 09/04/2020 1358   MCHC 32.0 09/04/2020 1358   RDW 13.3 09/04/2020 1358   RDW 13.2 03/02/2020 1017   LYMPHSABS 1.7 09/04/2020 1358   LYMPHSABS 1.3 03/02/2020 1017   MONOABS 0.4 09/04/2020 1358   EOSABS 0.1 09/04/2020 1358   EOSABS 0.1 03/02/2020 1017   BASOSABS 0.1 09/04/2020 1358   BASOSABS 0.1 03/02/2020 1017    DIAGNOSTIC IMAGING:  I have independently reviewed the scans and discussed with the patient. No results found.   ASSESSMENT:  1.Essential thrombocytosis: -Platelet count has been elevated at least for the past5-6years ranging from 472,000-684,000.Noted to have a normal platelet count back in 2012. -No other lab abnormalities. Noted to have a low hemoglobin (7.7-normal) from 2012 to 05/2019. -No history of connective tissue disorders. No prior history of thrombosis.  No smoking history. -JAK2 exon 12-15 mutation was positive consistent with myeloproliferative disorder. -Hydroxyurea 1 tablet daily started around April 11, 2020.  2. CAD: -Status post quadruple bypass surgery in December 2020.   PLAN:  1.Essential thrombocytosis: -He is tolerating hydroxyurea very well. -Reviewed CBC today which showed platelet  count improved 445 from 521.  Hematocrit is 38.  White count is normal.  LFTs are normal. -Continue hydroxyurea 2 tablets on Monday, Wednesday and Friday and 1 tablet rest of the week. -Continue aspirin 81 mg. -RTC 6 weeks for follow-up with labs.  2.  Hypertension: -Continue amlodipine, valsartan and HCTZ.  Blood pressure today is 120/70.  3.  CAD: -Continue atorvastatin, aspirin and Plavix.  Orders placed this encounter:  No orders of the defined types were placed in this encounter.    Derek Jack, MD [place] [phone]   I, Milinda Antis, am acting as a scribe for Dr. Sanda Linger.  I, Derek Jack MD, have reviewed the above documentation for accuracy and completeness, and I agree with the above.

## 2020-09-04 NOTE — Patient Instructions (Signed)
[place] at [facility] Discharge Instructions  You were seen today by Dr. Delton Coombes. He went over your recent results. Continue taking Hydrea 2 tablets on Monday, Wednesday, Friday and 1 tablet the rest of the week. Dr. Delton Coombes will see you back in 6 weeks for labs and follow up.   Thank you for choosing [facility] at [facility] to provide your oncology and hematology care.  To afford each patient quality time with our provider, please arrive at least 15 minutes before your scheduled appointment time.   If you have a lab appointment with the [facility] please come in thru the Main Entrance and check in at the main information desk  You need to re-schedule your appointment should you arrive 10 or more minutes late.  We strive to give you quality time with our providers, and arriving late affects you and other patients whose appointments are after yours.  Also, if you no show three or more times for appointments you may be dismissed from the clinic at the providers discretion.     Again, thank you for choosing [facility].  Our hope is that these requests will decrease the amount of time that you wait before being seen by our physicians.       _____________________________________________________________  Should you have questions after your visit to [facility], please contact our office at [phone] between the hours of 8:00 a.m. and 4:30 p.m.  Voicemails left after 4:00 p.m. will not be returned until the following business day.  For prescription refill requests, have your pharmacy contact our office and allow 72 hours.    Cancer Center Support Programs:   > Cancer Support Group  2nd Tuesday of the month 1pm-2pm, Journey Room

## 2020-09-05 ENCOUNTER — Other Ambulatory Visit (HOSPITAL_COMMUNITY): Payer: Self-pay

## 2020-09-05 DIAGNOSIS — D473 Essential (hemorrhagic) thrombocythemia: Secondary | ICD-10-CM

## 2020-09-26 ENCOUNTER — Other Ambulatory Visit: Payer: Self-pay | Admitting: Family Medicine

## 2020-10-24 ENCOUNTER — Other Ambulatory Visit (HOSPITAL_COMMUNITY): Payer: Medicare HMO

## 2020-10-24 ENCOUNTER — Ambulatory Visit (HOSPITAL_COMMUNITY): Payer: Medicare HMO | Admitting: Hematology

## 2020-10-25 ENCOUNTER — Inpatient Hospital Stay (HOSPITAL_BASED_OUTPATIENT_CLINIC_OR_DEPARTMENT_OTHER): Payer: Medicare HMO | Admitting: Physician Assistant

## 2020-10-25 ENCOUNTER — Inpatient Hospital Stay (HOSPITAL_COMMUNITY): Payer: Medicare HMO | Attending: Hematology

## 2020-10-25 ENCOUNTER — Other Ambulatory Visit: Payer: Self-pay

## 2020-10-25 VITALS — BP 121/64 | HR 51 | Temp 97.2°F | Resp 18 | Wt 167.5 lb

## 2020-10-25 DIAGNOSIS — D473 Essential (hemorrhagic) thrombocythemia: Secondary | ICD-10-CM | POA: Insufficient documentation

## 2020-10-25 LAB — COMPREHENSIVE METABOLIC PANEL
ALT: 14 U/L (ref 0–44)
AST: 20 U/L (ref 15–41)
Albumin: 4.1 g/dL (ref 3.5–5.0)
Alkaline Phosphatase: 64 U/L (ref 38–126)
Anion gap: 6 (ref 5–15)
BUN: 21 mg/dL (ref 8–23)
CO2: 30 mmol/L (ref 22–32)
Calcium: 9.4 mg/dL (ref 8.9–10.3)
Chloride: 105 mmol/L (ref 98–111)
Creatinine, Ser: 1.04 mg/dL (ref 0.61–1.24)
GFR, Estimated: >60 mL/min (ref >60)
Glucose, Bld: 85 mg/dL (ref 70–99)
Potassium: 4.6 mmol/L (ref 3.5–5.1)
Sodium: 141 mmol/L (ref 135–145)
Total Bilirubin: 1 mg/dL (ref 0.3–1.2)
Total Protein: 7.4 g/dL (ref 6.5–8.1)

## 2020-10-25 LAB — CBC WITH DIFFERENTIAL/PLATELET
Abs Immature Granulocytes: 0.02 10*3/uL (ref 0.00–0.07)
Basophils Absolute: 0.1 10*3/uL (ref 0.0–0.1)
Basophils Relative: 1 %
Eosinophils Absolute: 0.2 10*3/uL (ref 0.0–0.5)
Eosinophils Relative: 3 %
HCT: 39.5 % (ref 39.0–52.0)
Hemoglobin: 12.7 g/dL — ABNORMAL LOW (ref 13.0–17.0)
Immature Granulocytes: 0 %
Lymphocytes Relative: 25 %
Lymphs Abs: 1.5 10*3/uL (ref 0.7–4.0)
MCH: 36.3 pg — ABNORMAL HIGH (ref 26.0–34.0)
MCHC: 32.2 g/dL (ref 30.0–36.0)
MCV: 112.9 fL — ABNORMAL HIGH (ref 80.0–100.0)
Monocytes Absolute: 0.5 10*3/uL (ref 0.1–1.0)
Monocytes Relative: 8 %
Neutro Abs: 3.7 10*3/uL (ref 1.7–7.7)
Neutrophils Relative %: 63 %
Platelets: 482 10*3/uL — ABNORMAL HIGH (ref 150–400)
RBC: 3.5 MIL/uL — ABNORMAL LOW (ref 4.22–5.81)
RDW: 13.1 % (ref 11.5–15.5)
WBC: 5.8 10*3/uL (ref 4.0–10.5)
nRBC: 0 % (ref 0.0–0.2)

## 2020-10-25 NOTE — Patient Instructions (Signed)
[place] [facility] Discharge Instructions  You were seen today by Tarri Abernethy PA-C for your elevated platelets (essential thrombocytosis).  Your platelets are stable, so we will continue the same dose of Hydrea and see you back in 2 months.    LABS: Return in 2 months for labs   OTHER TESTS: None  MEDICATIONS: Continue Hydrea 1000 mg on M/W/F and 500 mg on Tu/Th/Sa/Su  FOLLOW-UP APPOINTMENT: Return in 2 months   Thank you for choosing [facility] to provide your oncology and hematology care.  To afford each patient quality time with our provider, please arrive at least 15 minutes before your scheduled appointment time.   If you have a lab appointment with the [facility] please come in thru the Main Entrance and check in at the main information desk.  You need to re-schedule your appointment should you arrive 10 or more minutes late.  We strive to give you quality time with our providers, and arriving late affects you and other patients whose appointments are after yours.  Also, if you no show three or more times for appointments you may be dismissed from the clinic at the providers discretion.     Again, thank you for choosing [facility].  Our hope is that these requests will decrease the amount of time that you wait before being seen by our physicians.       _____________________________________________________________  Should you have questions after your visit to [facility], please contact our office at [phone] and follow the prompts.  Our office hours are 8:00 a.m. and 4:30 p.m. Monday - Friday.  Please note that voicemails left after 4:00 p.m. may not be returned until the following business day.  We are closed weekends and major holidays.  You do have access to a nurse 24-7, just call the main number to the clinic [phone] and do not press any options, hold on the line and a  nurse will answer the phone.    For prescription refill requests, have your pharmacy contact our office and allow 72 hours.    Due to Covid, you will need to wear a mask upon entering the hospital. If you do not have a mask, a mask will be given to you at the Main Entrance upon arrival. For doctor visits, patients may have 1 support person age 72 or older with them. For treatment visits, patients can not have anyone with them due to social distancing guidelines and our immunocompromised population.

## 2020-10-25 NOTE — Progress Notes (Signed)
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 male returns for routine follow-up of his essential thrombocytosis.  He was last seen by Dr. Delton Coombes on 09/04/2020.  At his visit today, he reports feeling fairly well.  Energy is a little bit better than it used to be, rates energy at about 75%.  He has good appetite at 100%, weight is staying stable. Denies recent surgeries, infections, or recent hospitalizations.   Denies recent thrombotic events. No erythromelalgias, aquagenic pruritis, or changes in finger/toe coloration.  No new onset or worsening peripheral edema. Denies early satiety and abdominal pain. No new neurologic symptoms such as tinnitus, new-onset hearing loss, blurred vision, headache, peripheral neuropathy, or dizziness.   He has not noticed any recent bleeding such as epistaxis, hematuria or hematochezia.  He denies B-symptoms such as fever, chills, weight loss, and night sweats.  No new masses or lymphadenopathy per his report.     He is tolerating Hydrea well.  CBC shows expected macrocytosis.  Patient denies cutaneous ulcers and nonhealing skin wounds.  No complaints of mucous cytosis.  Denies gastrointestinal symptoms such as gastritis, nausea, vomiting, diarrhea.  Energy levels have been good.  Labs today (10/25/2020) show relatively stable platelets at 482 (compared to 445 on 09/04/2020).    REVIEW OF SYSTEMS:  Review of Systems  Constitutional: Positive for fatigue (energy 75%). Negative for appetite change, chills, diaphoresis, fever and unexpected weight change.  HENT:   Negative for lump/mass and  nosebleeds.   Eyes: Negative for eye problems.  Respiratory: Negative for cough, hemoptysis and shortness of breath.   Cardiovascular: Negative for chest pain, leg swelling and palpitations.  Gastrointestinal: Negative for abdominal pain, blood in stool, constipation, diarrhea, nausea and vomiting.  Genitourinary: Negative for hematuria.   Skin: Negative.   Neurological: Negative for dizziness, headaches and light-headedness.  Hematological: Does not bruise/bleed easily.     PAST MEDICAL/SURGICAL HISTORY:  Past Medical History:  Diagnosis Date  . Arthritis    "knees, wrists, knuckles" (04/26/2014)  . CAD (coronary artery disease)   . Headache    "had a couple/wk til ~ 2 wks ago" (04/26/2014)  . Hyperlipidemia   . Hypertension   . LBBB (left bundle branch block) 07/06/2018  . PAD (peripheral artery disease) ([place])   . 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 Type II diabetes mellitus ([place])    Past Surgical History:  Procedure Laterality Date  . BACK SURGERY    . CATARACT EXTRACTION W/ INTRAOCULAR LENS  IMPLANT, BILATERAL Bilateral ~ 2014  . CORONARY ANGIOPLASTY WITH STENT PLACEMENT  08/2010; 04/26/2014   "1, LAD; 1"  . CORONARY ARTERY BYPASS GRAFT N/A 05/19/2019   Procedure: CORONARY ARTERY BYPASS GRAFTING (CABG), on pump, times four using left internal mammary artery and right greater saphenous vein harvested endoscopically;  Surgeon: Lajuana Matte, MD;  Location: [facility];  Service: Open Heart Surgery;  Laterality: N/A;  . LEFT HEART CATH AND CORONARY ANGIOGRAPHY N/A 05/18/2019   Procedure: LEFT HEART CATH AND CORONARY ANGIOGRAPHY;  Surgeon: [name],  Ulice Dash, MD;  Location: [facility];  Service: Cardiovascular;  Laterality: N/A;  . LEFT HEART CATHETERIZATION WITH CORONARY ANGIOGRAM N/A 04/26/2014   Procedure: LEFT HEART CATHETERIZATION WITH CORONARY ANGIOGRAM;  Surgeon: Laverda Page, MD;  Location: [facility] C[facility];  Service:  Cardiovascular;  Laterality: N/A;  . LUMBAR LAMINECTOMY/DECOMPRESSION MICRODISCECTOMY  01/2010     SOCIAL HISTORY:  Social History   Socioeconomic History  . Marital status: Married    Spouse name: Joseph Byrd  . Number of children: 6  . Years of education: 3  . Highest education level: 3rd grade  Occupational History  . Occupation: retired  Tobacco Use  . Smoking status: Former Smoker    Packs/day: 0.12    Years: 10.00    Pack years: 1.20    Types: Cigarettes    Quit date: 09/04/1982    Years since quitting: 38.1  . Smokeless tobacco: Never Used  Vaping Use  . Vaping Use: Never used  Substance and Sexual Activity  . Alcohol use: No  . Drug use: No  . Sexual activity: Yes  Other Topics Concern  . Not on file  Social History Narrative  . Not on file   Social Determinants of Health   Financial Resource Strain: Low Risk   . Difficulty of Paying Living Expenses: Not hard at all  Food Insecurity: No Food Insecurity  . Worried About Charity fundraiser in the Last Year: Never true  . Ran Out of Food in the Last Year: Never true  Transportation Needs: No Transportation Needs  . Lack of Transportation (Medical): No  . Lack of Transportation (Non-Medical): No  Physical Activity: Inactive  . Days of Exercise per Week: 0 days  . Minutes of Exercise per Session: 0 min  Stress: No Stress Concern Present  . Feeling of Stress : Not at all  Social Connections: Moderately Isolated  . Frequency of Communication with Friends and Family: Twice a week  . Frequency of Social Gatherings with Friends and Family: Never  . Attends Religious Services: More than 4 times per year  . Active Member of Clubs or Organizations: No  . Attends Archivist Meetings: Never  . Marital Status: Married  Human resources officer Violence: Not At Risk  . Fear of Current or Ex-Partner: No  . Emotionally Abused: No  . Physically Abused: No  . Sexually Abused: No    FAMILY HISTORY:  Family History   Problem Relation Age of Onset  . Cancer Sister     CURRENT MEDICATIONS:  Outpatient Encounter Medications as of 10/25/2020  Medication Sig  . acetaminophen (TYLENOL) 325 MG tablet Take 650 mg by mouth every 6 (six) hours as needed for moderate pain or headache.  Marland Kitchen amLODipine (NORVASC) 5 MG tablet Take 5 mg by mouth every morning.  Marland Kitchen amLODIPine-Valsartan-HCTZ (EXFORGE HCT) 5-160-25 MG TABS Take 1 tablet by mouth every morning.  Marland Kitchen aspirin EC 81 MG tablet Take 81 mg by mouth every morning.  Marland Kitchen atorvastatin (LIPITOR) 20 MG tablet TAKE 1 TABLET BY MOUTH EVERY DAY  . clopidogrel (PLAVIX) 75 MG tablet TAKE 1 TABLET BY MOUTH EVERY DAY  . donepezil (ARICEPT) 10 MG tablet Take 10 mg by mouth at bedtime.   . hydrochlorothiazide (HYDRODIURIL) 25 MG tablet TAKE 1 TABLET BY MOUTH EVERY DAY  . hydroxyurea (HYDREA) 500 MG capsule Take 1 capsule (500 mg total) by mouth daily. 2 tablets on Monday, Wednesday and Friday and 1 tablet daily rest of the week.  Marland Kitchen  metoprolol succinate (TOPROL-XL) 50 MG 24 hr tablet Take 1 tablet (50 mg total) by mouth daily. Take with or immediately following a meal.  . Multiple Vitamin (MULTIVITAMIN WITH MINERALS) TABS tablet Take 1 tablet by mouth daily.  . nitroGLYCERIN (NITROSTAT) 0.4 MG SL tablet DISSOLVE 1 TABLET UNDER TONGUE EVERY 5 MIN AS NEEDED FOR CHEST PAIN, MAX 3 DOSES IN 15 MIN (Patient not taking: Reported on 09/04/2020)  . OLANZapine zydis (ZYPREXA) 5 MG disintegrating tablet Take 1 tablet (5 mg total) by mouth at bedtime.  Glory Rosebush VERIO test strip TEST BLOOD SUGAR DAILY DX E11.9  . Polyethyl Glycol-Propyl Glycol (SYSTANE OP) Place 1 drop into both eyes daily.  . tamsulosin (FLOMAX) 0.4 MG CAPS capsule TAKE 1 CAPSULE BY MOUTH EVERY DAY  . valsartan (DIOVAN) 160 MG tablet Take 160 mg by mouth every morning.   No facility-administered encounter medications on file as of 10/25/2020.    ALLERGIES:  Allergies  Allergen Reactions  . Lisinopril     Patient says no  drug allergies     PHYSICAL EXAM:  ECOG PERFORMANCE STATUS: 1 - Symptomatic but completely ambulatory  There were no vitals filed for this visit. There were no vitals filed for this visit. Physical Exam Constitutional:      Appearance: Normal appearance.  HENT:     Head: Normocephalic and atraumatic.     Mouth/Throat:     Mouth: Mucous membranes are moist.  Eyes:     Extraocular Movements: Extraocular movements intact.     Pupils: Pupils are equal, round, and reactive to light.  Cardiovascular:     Rate and Rhythm: Normal rate and regular rhythm.     Pulses: Normal pulses.     Heart sounds: Normal heart sounds.  Pulmonary:     Effort: Pulmonary effort is normal.     Breath sounds: Normal breath sounds.  Abdominal:     General: Bowel sounds are normal.     Palpations: Abdomen is soft.     Tenderness: There is no abdominal tenderness.  Musculoskeletal:        General: No swelling.     Right lower leg: No edema.     Left lower leg: No edema.  Lymphadenopathy:     Cervical: No cervical adenopathy.  Skin:    General: Skin is warm and dry.  Neurological:     General: No focal deficit present.     Mental Status: He is alert and oriented to person, place, and time.  Psychiatric:        Mood and Affect: Mood normal.        Behavior: Behavior normal.      LABORATORY DATA:  I have reviewed the labs as listed.  CBC    Component Value Date/Time   WBC 5.9 09/04/2020 1358   RBC 3.49 (L) 09/04/2020 1358   HGB 12.3 (L) 09/04/2020 1358   HGB 13.7 03/02/2020 1017   HCT 38.4 (L) 09/04/2020 1358   HCT 42.2 03/02/2020 1017   PLT 445 (H) 09/04/2020 1358   PLT 611 (H) 03/02/2020 1017   MCV 110.0 (H) 09/04/2020 1358   MCV 92 03/02/2020 1017   MCH 35.2 (H) 09/04/2020 1358   MCHC 32.0 09/04/2020 1358   RDW 13.3 09/04/2020 1358   RDW 13.2 03/02/2020 1017   LYMPHSABS 1.7 09/04/2020 1358   LYMPHSABS 1.3 03/02/2020 1017   MONOABS 0.4 09/04/2020 1358   EOSABS 0.1 09/04/2020 1358    EOSABS 0.1 03/02/2020 1017   BASOSABS  0.1 09/04/2020 1358   BASOSABS 0.1 03/02/2020 1017   CMP Latest Ref Rng & Units 09/04/2020 07/19/2020 03/23/2020  Glucose 70 - 99 mg/dL 113(H) 121(H) 98  BUN 8 - 23 mg/dL 23 25(H) 19  Creatinine 0.61 - 1.24 mg/dL 1.09 1.09 1.05  Sodium 135 - 145 mmol/L 140 137 141  Potassium 3.5 - 5.1 mmol/L 3.9 3.9 4.3  Chloride 98 - 111 mmol/L 104 107 101  CO2 22 - 32 mmol/L 27 26 30   Calcium 8.9 - 10.3 mg/dL 8.9 8.8(L) 9.3  Total Protein 6.5 - 8.1 g/dL 7.2 7.3 7.4  Total Bilirubin 0.3 - 1.2 mg/dL 1.2 0.7 0.8  Alkaline Phos 38 - 126 U/L 51 61 59  AST 15 - 41 U/L 20 22 18   ALT 0 - 44 U/L 13 14 12     DIAGNOSTIC IMAGING:  I have independently reviewed the relevant imaging and discussed with the patient.  ASSESSMENT: 1.Essential thrombocytosis (JAK2 exon 12-15) -Platelet count has been elevated at least for the past5-6years ranging from 472,000-684,000.Noted to have a normal platelet count back in 2012. -No other current lab abnormalities. Noted to have previously had a low hemoglobin (7.7 - normal) from 2012 to 05/2019. -No history of connective tissue disorders. No prior history of thrombosis. No smoking history. -JAK2 exon 12-15 mutation was positive (consistent with myeloproliferative disorder) -Hydroxyurea 1 tablet daily started around November 2021. - Currently taking Hydrea 1000 mg M/W/F and 500 mg Tu/Th/Sa/Su - Labs today (10/25/2020) show relatively stable platelets at 482 (compared to 445 on 09/04/2020).   2.  History ofCAD -Status post quadruple bypass surgery in December 2020.   PLAN:  1.Essential thrombocytosis (JAK2 exon 12-15) -He is tolerating hydroxyurea very well. -Labs today (10/25/2020) show relatively stable platelets at 482 (compared to 445 on 09/04/2020).  -Continue taking Hydrea 1000 mg M/W/F and 500 mg Tu/Th/Sa/Su -Continue aspirin 81 mg. -RTC 8 weeks for follow-up with labs.  2. Hypertension: -Continue amlodipine,  valsartan and HCTZ.  Blood pressure today is 121/64  3. CAD: -Continue atorvastatin, aspirin and Plavix.   PLAN SUMMARY & DISPOSITION: -Labs and RTC in 8 weeks   All questions were answered. The patient knows to call the clinic with any problems, questions or concerns.  Medical decision making: Low  Time spent on visit: I spent 15 minutes counseling the patient face to face. The total time spent in the appointment was 20 minutes and more than 50% was on counseling.   Harriett Rush, PA-C  10/25/20 8:55 AM

## 2020-10-27 ENCOUNTER — Other Ambulatory Visit (HOSPITAL_COMMUNITY): Payer: Medicare HMO

## 2020-10-27 ENCOUNTER — Ambulatory Visit (HOSPITAL_COMMUNITY): Payer: Medicare HMO | Admitting: Physician Assistant

## 2020-10-27 ENCOUNTER — Inpatient Hospital Stay (HOSPITAL_COMMUNITY): Payer: Medicare HMO

## 2020-12-01 ENCOUNTER — Ambulatory Visit: Payer: Medicare HMO | Admitting: Family Medicine

## 2020-12-07 ENCOUNTER — Encounter: Payer: Self-pay | Admitting: Family Medicine

## 2020-12-26 ENCOUNTER — Other Ambulatory Visit: Payer: Self-pay | Admitting: Family Medicine

## 2021-01-01 ENCOUNTER — Encounter: Payer: Self-pay | Admitting: Cardiology

## 2021-01-01 ENCOUNTER — Other Ambulatory Visit: Payer: Self-pay

## 2021-01-01 ENCOUNTER — Ambulatory Visit: Payer: Medicare HMO | Admitting: Cardiology

## 2021-01-01 VITALS — BP 127/73 | HR 71 | Temp 98.7°F | Resp 16 | Ht 68.5 in | Wt 168.6 lb

## 2021-01-01 DIAGNOSIS — Z951 Presence of aortocoronary bypass graft: Secondary | ICD-10-CM

## 2021-01-01 DIAGNOSIS — E7800 Pure hypercholesterolemia, unspecified: Secondary | ICD-10-CM | POA: Diagnosis not present

## 2021-01-01 DIAGNOSIS — I10 Essential (primary) hypertension: Secondary | ICD-10-CM

## 2021-01-01 DIAGNOSIS — I25118 Atherosclerotic heart disease of native coronary artery with other forms of angina pectoris: Secondary | ICD-10-CM | POA: Diagnosis not present

## 2021-01-01 NOTE — Progress Notes (Signed)
Primary Physician/Referring:  Dettinger, [name], MD  Patient ID: Joseph Byrd, male    DOB: 1934/05/05, 87 y.o.   MRN: [id number]  Chief Complaint  Patient presents with  . Coronary Artery Disease  . Hypertension  . Follow-up    1 year   HPI:    HPI: Joseph Byrd  is a 87 y.o. African-American male with coronary artery disease and angioplasty to his mid LAD in 2012 and to distal dominant circumflex coronary artery in 2015, hypertension, hyperlipidemia, CABG x4 on 05/19/2019 presents for annual visit.  He is doing well, he has not had any recurrence of chest pain, he has been walking on a daily basis without dyspnea, palpitations or any symptoms of claudication.   Past Medical History:  Diagnosis Date  . Arthritis    "knees, wrists, knuckles" (04/26/2014)  . CAD (coronary artery disease)   . Headache    "had a couple/wk til ~ 2 wks ago" (04/26/2014)  . Hyperlipidemia   . Hypertension   . LBBB (left bundle branch block) 07/06/2018  . PAD (peripheral artery disease) ([place])   . 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 Type II diabetes mellitus ([place])    Past Surgical History:  Procedure Laterality Date  . BACK SURGERY    . CATARACT EXTRACTION W/ INTRAOCULAR LENS  IMPLANT, BILATERAL Bilateral ~ 2014  . CORONARY ANGIOPLASTY WITH STENT PLACEMENT  08/2010; 04/26/2014   "1, LAD; 1"  . CORONARY ARTERY BYPASS GRAFT N/A 05/19/2019   Procedure: CORONARY ARTERY BYPASS GRAFTING (CABG), on pump, times four using left internal mammary artery and right greater saphenous vein harvested endoscopically;  Surgeon: Lajuana Matte, MD;  Location: [facility];  Service: Open Heart Surgery;  Laterality: N/A;  . LEFT HEART CATH AND CORONARY ANGIOGRAPHY N/A 05/18/2019   Procedure: LEFT HEART CATH AND CORONARY ANGIOGRAPHY;  Surgeon: Adrian Prows, MD;  Location: [facility];  Service: Cardiovascular;  Laterality: N/A;  . LEFT HEART  CATHETERIZATION WITH CORONARY ANGIOGRAM N/A 04/26/2014   Procedure: LEFT HEART CATHETERIZATION WITH CORONARY ANGIOGRAM;  Surgeon: Laverda Page, MD;  Location: [facility] CATH LAB;  Service: Cardiovascular;  Laterality: N/A;  . LUMBAR LAMINECTOMY/DECOMPRESSION MICRODISCECTOMY  01/2010   Social History   Tobacco Use  . Smoking status: Former    Packs/day: 0.12    Years: 10.00    Pack years: 1.20    Types: Cigarettes    Quit date: 09/04/1982    Years since quitting: 38.3  . Smokeless tobacco: Never  Substance Use Topics  . Alcohol use: No    ROS  Review of Systems  Cardiovascular:  Positive for leg swelling (minimal). Negative for chest pain and dyspnea on exertion.  Gastrointestinal:  Negative for melena.  Objective  Blood pressure 127/73, pulse 71, temperature 98.7 F (37.1 C), temperature source Temporal, resp. rate 16, height 5' 8.5" (1.74 m), weight 168 lb 9.6 oz (76.5 kg), SpO2 98 %. Body mass index is 25.26 kg/m.   Vitals with BMI 01/01/2021 10/25/2020 09/04/2020  Height 5' 8.5" - -  Weight 168 lbs 10 oz 167 lbs 9 oz 170 lbs 11 oz  BMI [id number] - -  Systolic [id number]  Diastolic 73 64 70  Pulse 71 51 64    Physical Exam Neck:     Vascular: No carotid bruit or JVD.  Cardiovascular:     Pulses:  Femoral pulses are 2+ on the right side and 2+ on the left side.      Popliteal pulses are 0 on the right side and 1+ on the left side.       Dorsalis pedis pulses are 0 on the right side and 0 on the left side.       Posterior tibial pulses are 0 on the right side and 0 on the left side.     Heart sounds: S1 normal and S2 normal. Murmur heard.  Early systolic murmur is present with a grade of 2/6 at the upper right sternal border and apex.    No gallop.     Comments: No leg edema. No JVD Pulmonary:     Effort: Pulmonary effort is normal.     Breath sounds: Normal breath sounds. No wheezing or rales.  Abdominal:     General: Bowel sounds are normal.     Palpations: Abdomen  is soft.   Radiology: No results found.  Laboratory examination:   CMP Latest Ref Rng & Units 10/25/2020 09/04/2020 07/19/2020  Glucose 70 - 99 mg/dL 85 113(H) 121(H)  BUN 8 - 23 mg/dL 21 23 25(H)  Creatinine 0.61 - 1.24 mg/dL 1.04 1.09 1.09  Sodium 135 - 145 mmol/L 141 140 137  Potassium 3.5 - 5.1 mmol/L 4.6 3.9 3.9  Chloride 98 - 111 mmol/L 105 104 107  CO2 22 - 32 mmol/L '30 27 26  '$ Calcium 8.9 - 10.3 mg/dL 9.4 8.9 8.8(L)  Total Protein 6.5 - 8.1 g/dL 7.4 7.2 7.3  Total Bilirubin 0.3 - 1.2 mg/dL 1.0 1.2 0.7  Alkaline Phos 38 - 126 U/L 64 51 61  AST 15 - 41 U/L '20 20 22  '$ ALT 0 - 44 U/L '14 13 14   '$ CBC Latest Ref Rng & Units 10/25/2020 09/04/2020 07/19/2020  WBC 4.0 - 10.5 K/uL 5.8 5.9 5.7  Hemoglobin 13.0 - 17.0 g/dL 12.7(L) 12.3(L) 12.5(L)  Hematocrit 39.0 - 52.0 % 39.5 38.4(L) 39.8  Platelets 150 - 400 K/uL 482(H) 445(H) 521(H)    Lipid Panel Recent Labs    03/02/20 1017  CHOL 128  TRIG 47  LDLCALC 62  HDL 55  CHOLHDL 2.3    HEMOGLOBIN A1C Lab Results  Component Value Date   HGBA1C 4.8 06/01/2020   MPG 93.93 05/19/2019   TSH No results for input(s): TSH in the last 8760 hours.  Medications   Current Outpatient Medications  Medication Instructions  . acetaminophen (TYLENOL) 650 mg, Oral, Every 6 hours PRN  . amLODipine (NORVASC) 5 mg, Oral, Every morning  . amLODIPine-Valsartan-HCTZ (EXFORGE HCT) 5-160-25 MG TABS 1 tablet, Oral, BH-each morning  . aspirin EC 81 mg, Oral, Every morning  . atorvastatin (LIPITOR) 20 MG tablet TAKE 1 TABLET BY MOUTH EVERY DAY  . clopidogrel (PLAVIX) 75 MG tablet TAKE 1 TABLET BY MOUTH EVERY DAY  . donepezil (ARICEPT) 10 mg, Oral, Daily at bedtime  . hydrochlorothiazide (HYDRODIURIL) 25 MG tablet TAKE 1 TABLET BY MOUTH EVERY DAY  . hydroxyurea (HYDREA) 500 mg, Oral, Daily, 2 tablets on Monday, Wednesday and Friday and 1 tablet daily rest of the week.  . metoprolol succinate (TOPROL-XL) 50 mg, Oral, Daily, Take with or immediately  following a meal.  . Multiple Vitamin (MULTIVITAMIN WITH MINERALS) TABS tablet 1 tablet, Oral, Daily  . nitroGLYCERIN (NITROSTAT) 0.4 MG SL tablet DISSOLVE 1 TABLET UNDER TONGUE EVERY 5 MIN AS NEEDED FOR CHEST PAIN, MAX 3 DOSES IN 15 MIN  . OLANZapine  zydis (ZYPREXA) 5 mg, Oral, Daily at bedtime  . ONETOUCH VERIO test strip TEST BLOOD SUGAR DAILY DX E11.9  . Polyethyl Glycol-Propyl Glycol (SYSTANE OP) 1 drop, Both Eyes, Daily  . tamsulosin (FLOMAX) 0.4 MG CAPS capsule TAKE 1 CAPSULE BY MOUTH EVERY DAY  . valsartan (DIOVAN) 160 mg, Oral, Every morning   Cardiac Studies:   Hospital Lexiscan Myoview stress test 03/20/2018:  There was no ST segment deviation noted during stress.   Findings consistent with prior inferior/basal inferoseptal/inferoapical myocardial infarction. There is no current ischemia. This is a low to intermediate risk study based on prior scar and mildly decreased LVEF, there is no myocardium currently at jeopardy. The left ventricular ejection fraction is mildly decreased (48%).  Left Heart Catheterization 05/18/19:  Normal LVEDP.  LV gram although performed is nondiagnostic due to frequent PVCs. Left main: Ulcerated stenosis of 20 to 30% initially, progressed to at least 50 to 60% at the end of diagnostic angiography with dissection plane clearly evident. LAD: Ostial LAD with a 80 to 90% stenosis, calcific.  Diffuse disease noted in the mid to distal LAD.  Previously placed stent in the mid LAD in 2012: 2.25x22 Resolute) widely patent. Circumflex: Mid circumflex stent (3.5x58m Promus stent 04/26/14) widely patent.  Dominant vessel.  Mild diffuse disease.  OM1 is fairly large, ostial 90% stenosis. RI: Large vessel, with multiple branches.  Again calcified.  Ostium has a 90% stenosis.   Recommendation: Patient has unstable plaque in the left main.  This was exacerbated by angiography however remained stable without chest pain, no EKG abnormality, normal hemodynamics.  We  will admit the patient to the hospital in view of high risk for ACS and potential for sudden cardiac death.  He will be evaluated for urgent CABG. He is presently on aspirin and also moderate dose of cilostazol 50 mg twice daily.  This will certainly cause bleeding risk.  Carotid artery duplex 05/18/2019: Right Carotid: Velocities in the right ICA are consistent with a 1-39% stenosis.   Left Carotid: Velocities in the left ICA are consistent with a 1-39% stenosis. Vertebrals: Bilateral vertebral arteries demonstrate antegrade flow.  ABI 05/18/2019: Right ABI: Resting right ankle-brachial index indicates moderate right lower extremity arterial disease. Right PTA indicated normal limits, Right DP indicates moderate PAD. Left ABI: Resting left ankle-brachial index is within normal range. No evidence of significant left lower extremity arterial disease. Right   Rt Pressure (mmHg)IndexWaveform Comment  +--------+------------------+-----+---------+--------+ Brachial                       triphasic         +--------+------------------+-----+---------+--------+ PTA     157               1.05 biphasic          +--------+------------------+-----+---------+--------+ DP      106               0.71 biphasic          +--------+------------------+-----+---------+--------+   +--------+------------------+-----+---------+-------+ Left    Lt Pressure (mmHg)IndexWaveform Comment +--------+------------------+-----+---------+-------+ [id number]                   triphasic        +--------+------------------+-----+---------+-------+ PTA     180               1.21 biphasic         +--------+------------------+-----+---------+-------+ DP      172  1.15 biphasic         +--------+------------------+-----+---------+-------+  EKG:  EKG 01/01/2021: Sinus bradycardia at rate of 59 bpm, leftward enlargement, left axis deviation, left anterior fascicular  block.  Incomplete right bundle branch block.  Low-voltage complexes.  No significant change from 06/02/2019.   Assessment     ICD-10-CM   1. Coronary artery disease of native artery of native heart with stable angina pectoris ([place])  I25.118 EKG 12-Lead    2. S/P CABG x 4 LIMA to LAD, SVG to D1, SVG to OM 1 and SVG to RI 05/19/2019  Z95.1     3. Essential hypertension  I10     4. Hypercholesteremia  E78.00      Recommendations:   Joseph Byrd  is a 87 y.o. African-American male with coronary artery disease and angioplasty to his mid LAD in 2012 and to distal dominant circumflex coronary artery in 2015, hypertension, hyperlipidemia, CABG x4 on 05/19/2019 presents for annual visit.  He is doing well, he has not had any recurrence of chest pain, he has been walking on a daily basis without dyspnea, palpitations or any symptoms of claudication.  No change in his physical exam, EKG remains the same.  I reviewed his external labs, lipids are also under excellent control.  No changes in the medications were done today, I will see him back on an annual basis.   Adrian Prows, MD, [facility] 01/01/2021, 3:12 PM Office: [phone]

## 2021-01-02 ENCOUNTER — Inpatient Hospital Stay (HOSPITAL_COMMUNITY): Payer: Medicare HMO | Attending: Hematology

## 2021-01-02 DIAGNOSIS — E1151 Type 2 diabetes mellitus with diabetic peripheral angiopathy without gangrene: Secondary | ICD-10-CM | POA: Insufficient documentation

## 2021-01-02 DIAGNOSIS — E785 Hyperlipidemia, unspecified: Secondary | ICD-10-CM | POA: Diagnosis not present

## 2021-01-02 DIAGNOSIS — I10 Essential (primary) hypertension: Secondary | ICD-10-CM | POA: Insufficient documentation

## 2021-01-02 DIAGNOSIS — D473 Essential (hemorrhagic) thrombocythemia: Secondary | ICD-10-CM | POA: Diagnosis not present

## 2021-01-02 DIAGNOSIS — I2510 Atherosclerotic heart disease of native coronary artery without angina pectoris: Secondary | ICD-10-CM | POA: Diagnosis not present

## 2021-01-02 DIAGNOSIS — Z79899 Other long term (current) drug therapy: Secondary | ICD-10-CM | POA: Insufficient documentation

## 2021-01-02 DIAGNOSIS — M129 Arthropathy, unspecified: Secondary | ICD-10-CM | POA: Insufficient documentation

## 2021-01-02 LAB — CBC WITH DIFFERENTIAL/PLATELET
Abs Immature Granulocytes: 0.01 10*3/uL (ref 0.00–0.07)
Basophils Absolute: 0.1 10*3/uL (ref 0.0–0.1)
Basophils Relative: 1 %
Eosinophils Absolute: 0.1 10*3/uL (ref 0.0–0.5)
Eosinophils Relative: 2 %
HCT: 39 % (ref 39.0–52.0)
Hemoglobin: 12.7 g/dL — ABNORMAL LOW (ref 13.0–17.0)
Immature Granulocytes: 0 %
Lymphocytes Relative: 33 %
Lymphs Abs: 1.8 10*3/uL (ref 0.7–4.0)
MCH: 36 pg — ABNORMAL HIGH (ref 26.0–34.0)
MCHC: 32.6 g/dL (ref 30.0–36.0)
MCV: 110.5 fL — ABNORMAL HIGH (ref 80.0–100.0)
Monocytes Absolute: 0.4 10*3/uL (ref 0.1–1.0)
Monocytes Relative: 8 %
Neutro Abs: 2.9 10*3/uL (ref 1.7–7.7)
Neutrophils Relative %: 56 %
Platelets: 427 10*3/uL — ABNORMAL HIGH (ref 150–400)
RBC: 3.53 MIL/uL — ABNORMAL LOW (ref 4.22–5.81)
RDW: 13 % (ref 11.5–15.5)
WBC: 5.3 10*3/uL (ref 4.0–10.5)
nRBC: 0 % (ref 0.0–0.2)

## 2021-01-02 LAB — COMPREHENSIVE METABOLIC PANEL
ALT: 13 U/L (ref 0–44)
AST: 20 U/L (ref 15–41)
Albumin: 3.9 g/dL (ref 3.5–5.0)
Alkaline Phosphatase: 61 U/L (ref 38–126)
Anion gap: 6 (ref 5–15)
BUN: 21 mg/dL (ref 8–23)
CO2: 28 mmol/L (ref 22–32)
Calcium: 9.1 mg/dL (ref 8.9–10.3)
Chloride: 104 mmol/L (ref 98–111)
Creatinine, Ser: 1.03 mg/dL (ref 0.61–1.24)
GFR, Estimated: >60 mL/min (ref >60)
Glucose, Bld: 111 mg/dL — ABNORMAL HIGH (ref 70–99)
Potassium: 4.2 mmol/L (ref 3.5–5.1)
Sodium: 138 mmol/L (ref 135–145)
Total Bilirubin: 0.9 mg/dL (ref 0.3–1.2)
Total Protein: 7.3 g/dL (ref 6.5–8.1)

## 2021-01-02 LAB — LACTATE DEHYDROGENASE: LDH: 128 U/L (ref 98–192)

## 2021-01-04 ENCOUNTER — Ambulatory Visit: Payer: Medicare HMO | Admitting: Family Medicine

## 2021-01-08 NOTE — Progress Notes (Signed)
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 male returns for routine follow-up of his essential thrombocytosis.  He was last seen by Tarri Abernethy PA-C on 10/25/2020.  At today's visit, he reports feeling fairly well.  No recent hospitalizations, surgeries, or changes in baseline health status.  Reports that he has good energy.  He denies any B symptoms such as fever, chills, night sweats, unintentional weight loss.  He denies any current signs or symptoms of blood clot such as chest pain, dyspnea, or unilateral leg swelling.  No previous history of DVT or PE.  He denies erythromelalgia, aquagenic pruritus, vasomotor symptoms.  No hyperviscosity symptoms such as tinnitus, blurred vision, headache, peripheral neuropathy, dizziness.  He denies any current signs or symptoms of blood loss.  No epistaxis, hematemesis, hemoptysis, hematochezia, melena.  He is tolerating Hydrea well.  CBC shows expected macrocytosis.  Patient denies cutaneous ulcers and nonhealing skin wounds.  No complaints of oral ulcers.  Denies gastrointestinal symptoms such as gastritis, nausea, vomiting, diarrhea.  Energy levels have been good.  He has 100% energy and 100% appetite. He endorses that he is maintaining a stable weight.    REVIEW OF SYSTEMS:  Review of Systems  Constitutional:  Negative for appetite change, chills, diaphoresis, fatigue, fever and unexpected weight change.  HENT:   Negative for lump/mass and nosebleeds.   Eyes:  Negative for eye problems.  Respiratory:  Negative for cough, hemoptysis and shortness of breath.    Cardiovascular:  Negative for chest pain, leg swelling and palpitations.  Gastrointestinal:  Negative for abdominal pain, blood in stool, constipation, diarrhea, nausea and vomiting.  Genitourinary:  Negative for hematuria.   Skin: Negative.   Neurological:  Negative for dizziness, headaches and light-headedness.  Hematological:  Does not bruise/bleed easily.     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   LIMA to LAD, SVG to D1, SVG to OM 1 and SVG to RI 05/19/2019   Type II diabetes mellitus ([place])    Past Surgical History:  Procedure Laterality Date   BACK SURGERY     CATARACT EXTRACTION W/ INTRAOCULAR LENS  IMPLANT, BILATERAL Bilateral ~ 2014   CORONARY ANGIOPLASTY WITH STENT PLACEMENT  08/2010; 04/26/2014   "1, LAD; 1"   CORONARY ARTERY BYPASS GRAFT N/A 05/19/2019   Procedure: CORONARY ARTERY BYPASS GRAFTING (CABG), on pump, times four using left internal mammary artery and right greater saphenous vein harvested endoscopically;  Surgeon: Lajuana Matte, MD;  Location: [facility];  Service: Open Heart Surgery;  Laterality: N/A;   LEFT HEART CATH AND CORONARY ANGIOGRAPHY N/A 05/18/2019   Procedure: LEFT HEART CATH AND CORONARY ANGIOGRAPHY;  Surgeon: Adrian Prows, MD;  Location: [facility];  Service: Cardiovascular;  Laterality: N/A;  LEFT HEART CATHETERIZATION WITH CORONARY ANGIOGRAM N/A 04/26/2014   Procedure: LEFT HEART CATHETERIZATION WITH CORONARY ANGIOGRAM;  Surgeon: Laverda Page, MD;  Location: [facility] CATH LAB;  Service: Cardiovascular;  Laterality: N/A;   LUMBAR LAMINECTOMY/DECOMPRESSION MICRODISCECTOMY  01/2010     SOCIAL HISTORY:  Social  History   Socioeconomic History   Marital status: Married    Spouse name: Clarise Cruz   Number of children: 6   Years of education: 3   Highest education level: 3rd grade  Occupational History   Occupation: retired  Tobacco Use   Smoking status: Former    Packs/day: 0.12    Years: 10.00    Pack years: 1.20    Types: Cigarettes    Quit date: 09/04/1982    Years since quitting: 38.3   Smokeless tobacco: Never  Vaping Use   Vaping Use: Never used  Substance and Sexual Activity   Alcohol use: No   Drug use: No   Sexual activity: Yes  Other Topics Concern   Not on file  Social History Narrative   Not on file   Social Determinants of Health   Financial Resource Strain: Low Risk    Difficulty of Paying Living Expenses: Not hard at all  Food Insecurity: No Food Insecurity   Worried About Charity fundraiser in the Last Year: Never true   [place] in the Last Year: Never true  Transportation Needs: No Transportation Needs   Lack of Transportation (Medical): No   Lack of Transportation (Non-Medical): No  Physical Activity: Inactive   Days of Exercise per Week: 0 days   Minutes of Exercise per Session: 0 min  Stress: No Stress Concern Present   Feeling of Stress : Not at all  Social Connections: Moderately Isolated   Frequency of Communication with Friends and Family: Twice a week   Frequency of Social Gatherings with Friends and Family: Never   Attends Religious Services: More than 4 times per year   Active Member of [employer] or Organizations: No   Attends Music therapist: Never   Marital Status: Married  Human resources officer Violence: Not At Risk   Fear of Current or Ex-Partner: No   Emotionally Abused: No   Physically Abused: No   Sexually Abused: No    FAMILY HISTORY:  Family History  Problem Relation Age of Onset   Cancer Sister     CURRENT MEDICATIONS:  Outpatient Encounter Medications as of 01/09/2021  Medication Sig   acetaminophen (TYLENOL) 325 MG  tablet Take 650 mg by mouth every 6 (six) hours as needed for moderate pain or headache.   amLODipine (NORVASC) 5 MG tablet Take 5 mg by mouth every morning.   amLODIPine-Valsartan-HCTZ (EXFORGE HCT) 5-160-25 MG TABS Take 1 tablet by mouth every morning.   aspirin EC 81 MG tablet Take 81 mg by mouth every morning.   atorvastatin (LIPITOR) 20 MG tablet TAKE 1 TABLET BY MOUTH EVERY DAY   clopidogrel (PLAVIX) 75 MG tablet TAKE 1 TABLET BY MOUTH EVERY DAY   donepezil (ARICEPT) 10 MG tablet Take 10 mg by mouth at bedtime.    hydrochlorothiazide (HYDRODIURIL) 25 MG tablet TAKE 1 TABLET BY MOUTH EVERY DAY   hydroxyurea (HYDREA) 500 MG capsule Take 1 capsule (500 mg total) by mouth daily. 2 tablets on Monday, Wednesday and Friday and 1 tablet daily rest of the week.   metoprolol succinate (TOPROL-XL) 50 MG 24 hr tablet Take 1 tablet (50 mg total) by mouth daily. Take  with or immediately following a meal.   Multiple Vitamin (MULTIVITAMIN WITH MINERALS) TABS tablet Take 1 tablet by mouth daily.   nitroGLYCERIN (NITROSTAT) 0.4 MG SL tablet DISSOLVE 1 TABLET UNDER TONGUE EVERY 5 MIN AS NEEDED FOR CHEST PAIN, MAX 3 DOSES IN 15 MIN   OLANZapine zydis (ZYPREXA) 5 MG disintegrating tablet Take 1 tablet (5 mg total) by mouth at bedtime.   ONETOUCH VERIO test strip TEST BLOOD SUGAR DAILY DX E11.9   Polyethyl Glycol-Propyl Glycol (SYSTANE OP) Place 1 drop into both eyes daily.   tamsulosin (FLOMAX) 0.4 MG CAPS capsule TAKE 1 CAPSULE BY MOUTH EVERY DAY   valsartan (DIOVAN) 160 MG tablet Take 160 mg by mouth every morning.   No facility-administered encounter medications on file as of 01/09/2021.    ALLERGIES:  Allergies  Allergen Reactions   Lisinopril     Patient says no drug allergies     PHYSICAL EXAM:  ECOG PERFORMANCE STATUS: 0 - Asymptomatic  There were no vitals filed for this visit. There were no vitals filed for this visit. Physical Exam Constitutional:      Appearance: Normal appearance.   HENT:     Head: Normocephalic and atraumatic.     Mouth/Throat:     Mouth: Mucous membranes are moist.  Eyes:     Extraocular Movements: Extraocular movements intact.     Pupils: Pupils are equal, round, and reactive to light.  Cardiovascular:     Rate and Rhythm: Normal rate and regular rhythm.     Pulses: Normal pulses.     Heart sounds: Normal heart sounds.  Pulmonary:     Effort: Pulmonary effort is normal.     Breath sounds: Normal breath sounds.  Abdominal:     General: Bowel sounds are normal.     Palpations: Abdomen is soft.     Tenderness: There is no abdominal tenderness.  Musculoskeletal:        General: No swelling.     Right lower leg: No edema.     Left lower leg: No edema.  Lymphadenopathy:     Cervical: No cervical adenopathy.  Skin:    General: Skin is warm and dry.  Neurological:     General: No focal deficit present.     Mental Status: He is alert and oriented to person, place, and time.  Psychiatric:        Mood and Affect: Mood normal.        Behavior: Behavior normal.     LABORATORY DATA:  I have reviewed the labs as listed.  CBC    Component Value Date/Time   WBC 5.3 01/02/2021 1320   RBC 3.53 (L) 01/02/2021 1320   HGB 12.7 (L) 01/02/2021 1320   HGB 13.7 03/02/2020 1017   HCT 39.0 01/02/2021 1320   HCT 42.2 03/02/2020 1017   PLT 427 (H) 01/02/2021 1320   PLT 611 (H) 03/02/2020 1017   MCV 110.5 (H) 01/02/2021 1320   MCV 92 03/02/2020 1017   MCH 36.0 (H) 01/02/2021 1320   MCHC 32.6 01/02/2021 1320   RDW 13.0 01/02/2021 1320   RDW 13.2 03/02/2020 1017   LYMPHSABS 1.8 01/02/2021 1320   LYMPHSABS 1.3 03/02/2020 1017   MONOABS 0.4 01/02/2021 1320   EOSABS 0.1 01/02/2021 1320   EOSABS 0.1 03/02/2020 1017   BASOSABS 0.1 01/02/2021 1320   BASOSABS 0.1 03/02/2020 1017   CMP Latest Ref Rng & Units 01/02/2021 10/25/2020 09/04/2020  Glucose 70 - 99 mg/dL 111(H) 85 113(H)  BUN 8 - 23 mg/dL '21 21 23  '$ Creatinine 0.61 - 1.24 mg/dL 1.03 1.04 1.09   Sodium 135 - 145 mmol/L 138 141 140  Potassium 3.5 - 5.1 mmol/L 4.2 4.6 3.9  Chloride 98 - 111 mmol/L 104 105 104  CO2 22 - 32 mmol/L '28 30 27  '$ Calcium 8.9 - 10.3 mg/dL 9.1 9.4 8.9  Total Protein 6.5 - 8.1 g/dL 7.3 7.4 7.2  Total Bilirubin 0.3 - 1.2 mg/dL 0.9 1.0 1.2  Alkaline Phos 38 - 126 U/L 61 64 51  AST 15 - 41 U/L '20 20 20  '$ ALT 0 - 44 U/L '13 14 13    '$ DIAGNOSTIC IMAGING:  I have independently reviewed the relevant imaging and discussed with the patient.  ASSESSMENT & PLAN: 1.  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  Positive history of coronary artery disease.  Considered high risk due to age. -Hydroxyurea 1 tablet daily started around November 2021. - Currently taking Hydrea 1000 mg M/W/F and 500 mg Tu/Th/Sa/Su as well as aspirin 81 mg daily - He is tolerating Hydrea well  -No erythromelalgia, aquagenic pruritus, or vasomotor symptoms - Most recent labs (01/02/2021) show platelets 427, mildly decreased Hgb 12.7 with MCV 110.5, and normal WBC 5.3; CMP and LDH unremarkable - PLAN: Continue Hydrea and aspirin.  Labs and RTC in 4 months.  2.  Coronary artery disease - History of quadruple bypass surgery in December 2020 - PLAN: Continue follow-up with cardiology.  Continue statin, aspirin, Plavix.   PLAN SUMMARY & DISPOSITION: -Labs in 4 months - RTC after labs   All questions were answered. The patient knows to call the clinic with any problems, questions or concerns.  Medical decision making: Low  Time spent on visit: I spent 15 minutes counseling the patient face to face. The total time spent in the appointment was 25 minutes and more than 50% was on counseling.   Harriett Rush, PA-C  01/09/2021 2:49 PM

## 2021-01-09 ENCOUNTER — Inpatient Hospital Stay (HOSPITAL_BASED_OUTPATIENT_CLINIC_OR_DEPARTMENT_OTHER): Payer: Medicare HMO | Admitting: Physician Assistant

## 2021-01-09 VITALS — BP 125/66 | HR 66 | Temp 97.4°F | Resp 17 | Wt 171.2 lb

## 2021-01-09 DIAGNOSIS — E1151 Type 2 diabetes mellitus with diabetic peripheral angiopathy without gangrene: Secondary | ICD-10-CM | POA: Diagnosis not present

## 2021-01-09 DIAGNOSIS — Z79899 Other long term (current) drug therapy: Secondary | ICD-10-CM | POA: Diagnosis not present

## 2021-01-09 DIAGNOSIS — I10 Essential (primary) hypertension: Secondary | ICD-10-CM | POA: Diagnosis not present

## 2021-01-09 DIAGNOSIS — E785 Hyperlipidemia, unspecified: Secondary | ICD-10-CM | POA: Diagnosis not present

## 2021-01-09 DIAGNOSIS — D473 Essential (hemorrhagic) thrombocythemia: Secondary | ICD-10-CM

## 2021-01-09 DIAGNOSIS — M129 Arthropathy, unspecified: Secondary | ICD-10-CM | POA: Diagnosis not present

## 2021-01-09 DIAGNOSIS — I2510 Atherosclerotic heart disease of native coronary artery without angina pectoris: Secondary | ICD-10-CM | POA: Diagnosis not present

## 2021-01-09 NOTE — Progress Notes (Signed)
Patient is taking Hydrea as prescribed.  They have not missed any doses and report no side effects at this time.

## 2021-01-09 NOTE — Patient Instructions (Signed)
[place] at [facility] Discharge Instructions  You were seen today by Tarri Abernethy PA-C for your essential thrombocytosis ("elevated platelets").  Your blood counts are within their target range.  Continue to take Hydrea and aspirin at the same doses.  LABS: Return in 4 months for repeat labs  OTHER TESTS: None at this time  MEDICATIONS: No changes to home medications.  FOLLOW-UP APPOINTMENT: Office visit in 4 months.   Thank you for choosing [place] at [facility] to provide your oncology and hematology care.  To afford each patient quality time with our provider, please arrive at least 15 minutes before your scheduled appointment time.   If you have a lab appointment with the [facility] please come in thru the Main Entrance and check in at the main information desk.  You need to re-schedule your appointment should you arrive 10 or more minutes late.  We strive to give you quality time with our providers, and arriving late affects you and other patients whose appointments are after yours.  Also, if you no show three or more times for appointments you may be dismissed from the clinic at the providers discretion.     Again, thank you for choosing [facility]  Our hope is that these requests will decrease the amount of time that you wait before being seen by our physicians.       _____________________________________________________________  Should you have questions after your visit to [facility], please contact our office at [phone] and follow the prompts.  Our office hours are 8:00 a.m. and 4:30 p.m. Monday - Friday.  Please note that voicemails left after 4:00 p.m. may not be returned until the following business day.  We are closed weekends and major holidays.  You do have access to a nurse 24-7, just call the main number to the clinic [phone] and do not press any options, hold on the line  and a nurse will answer the phone.    For prescription refill requests, have your pharmacy contact our office and allow 72 hours.    Due to Covid, you will need to wear a mask upon entering the hospital. If you do not have a mask, a mask will be given to you at the Main Entrance upon arrival. For doctor visits, patients may have 1 support person age 60 or older with them. For treatment visits, patients can not have anyone with them due to social distancing guidelines and our immunocompromised population.

## 2021-01-31 ENCOUNTER — Ambulatory Visit (INDEPENDENT_AMBULATORY_CARE_PROVIDER_SITE_OTHER): Payer: Medicare HMO | Admitting: Family Medicine

## 2021-01-31 ENCOUNTER — Other Ambulatory Visit: Payer: Self-pay

## 2021-01-31 ENCOUNTER — Encounter: Payer: Self-pay | Admitting: Family Medicine

## 2021-01-31 VITALS — BP 111/70 | HR 59 | Ht 68.5 in | Wt 171.0 lb

## 2021-01-31 DIAGNOSIS — Z794 Long term (current) use of insulin: Secondary | ICD-10-CM | POA: Diagnosis not present

## 2021-01-31 DIAGNOSIS — I10 Essential (primary) hypertension: Secondary | ICD-10-CM | POA: Diagnosis not present

## 2021-01-31 DIAGNOSIS — E785 Hyperlipidemia, unspecified: Secondary | ICD-10-CM | POA: Diagnosis not present

## 2021-01-31 DIAGNOSIS — E1169 Type 2 diabetes mellitus with other specified complication: Secondary | ICD-10-CM | POA: Diagnosis not present

## 2021-01-31 DIAGNOSIS — R739 Hyperglycemia, unspecified: Secondary | ICD-10-CM

## 2021-01-31 LAB — BAYER DCA HB A1C WAIVED: HB A1C (BAYER DCA - WAIVED): 4.7 % (ref <7.0)

## 2021-01-31 MED ORDER — OLANZAPINE 10 MG PO TBDP: 10.0000 mg | ORAL_TABLET | Freq: Every day | ORAL | 3 refills | Status: AC

## 2021-01-31 NOTE — Progress Notes (Signed)
BP 111/70   Pulse (!) 59   Ht 5' 8.5" (1.74 m)   Wt 171 lb (77.6 kg)   SpO2 100%   BMI 25.62 kg/m    Subjective:   Patient ID: Joseph Byrd, male    DOB: 1933/06/25, 87 y.o.   MRN: [id number]  HPI: Joseph Byrd is a 87 y.o. male presenting on 01/31/2021 for Medical Management of Chronic Issues, Diabetes, and Hypertension   HPI Prediabetes Patient comes in today for recheck of his diabetes. Patient has been currently taking no medication currently. Patient is currently on an ACE inhibitor/ARB. Patient has not seen an ophthalmologist this year. Patient denies any issues with their feet. The symptom started onset as an adult hypertension and hyperlipidemia ARE RELATED TO DM   Hypertension Patient is currently on amlodipine-valsartan-hydrochlorothiazide, and their blood pressure today is 111/70. Patient denies any lightheadedness or dizziness. Patient denies headaches, blurred vision, chest pains, shortness of breath, or weakness. Denies any side effects from medication and is content with current medication.   Hyperlipidemia Patient is coming in for recheck of his hyperlipidemia. The patient is currently taking atorvastatin. They deny any issues with myalgias or history of liver damage from it. They deny any focal numbness or weakness or chest pain.   Patient has mild memory impairment and it has been stable per his wife who is here with him today.  Relevant past medical, surgical, family and social history reviewed and updated as indicated. Interim medical history since our last visit reviewed. Allergies and medications reviewed and updated.  Review of Systems  Constitutional:  Negative for chills and fever.  Eyes:  Negative for visual disturbance.  Respiratory:  Negative for shortness of breath and wheezing.   Cardiovascular:  Negative for chest pain and leg swelling.  Musculoskeletal:  Negative for back pain and gait problem.  Skin:  Negative for rash.   Psychiatric/Behavioral:  Positive for confusion.   All other systems reviewed and are negative.  Per HPI unless specifically indicated above   Allergies as of 01/31/2021       Reactions   Lisinopril    Patient says no drug allergies        Medication List        Accurate as of January 31, 2021 10:50 PM. If you have any questions, ask your nurse or doctor.          STOP taking these medications    amLODipine 5 MG tablet Commonly known as: NORVASC Stopped by: Worthy Rancher, MD   hydrochlorothiazide 25 MG tablet Commonly known as: HYDRODIURIL Stopped by: [name] Megumi Treaster, MD   valsartan 160 MG tablet Commonly known as: DIOVAN Stopped by: Worthy Rancher, MD       TAKE these medications    acetaminophen 325 MG tablet Commonly known as: TYLENOL Take 650 mg by mouth every 6 (six) hours as needed for moderate pain or headache.   amLODIPine-Valsartan-HCTZ 5-160-25 MG Tabs Commonly known as: Exforge HCT Take 1 tablet by mouth every morning.   aspirin EC 81 MG tablet Take 81 mg by mouth every morning.   atorvastatin 20 MG tablet Commonly known as: LIPITOR TAKE 1 TABLET BY MOUTH EVERY DAY   clopidogrel 75 MG tablet Commonly known as: PLAVIX TAKE 1 TABLET BY MOUTH EVERY DAY   donepezil 10 MG tablet Commonly known as: ARICEPT Take 10 mg by mouth at bedtime.   hydroxyurea 500 MG capsule Commonly known as: HYDREA Take 1 capsule (500 mg total)  by mouth daily. 2 tablets on Monday, Wednesday and Friday and 1 tablet daily rest of the week.   metoprolol succinate 50 MG 24 hr tablet Commonly known as: TOPROL-XL Take 1 tablet (50 mg total) by mouth daily. Take with or immediately following a meal.   multivitamin with minerals Tabs tablet Take 1 tablet by mouth daily.   nitroGLYCERIN 0.4 MG SL tablet Commonly known as: NITROSTAT DISSOLVE 1 TABLET UNDER TONGUE EVERY 5 MIN AS NEEDED FOR CHEST PAIN, MAX 3 DOSES IN 15 MIN   OLANZapine zydis 10 MG  disintegrating tablet Commonly known as: ZYPREXA Take 1 tablet (10 mg total) by mouth at bedtime. What changed:  medication strength how much to take Changed by: [name] Virl Coble, MD   OneTouch Verio test strip Generic drug: glucose blood TEST BLOOD SUGAR DAILY DX E11.9   SYSTANE OP Place 1 drop into both eyes daily.   tamsulosin 0.4 MG Caps capsule Commonly known as: FLOMAX TAKE 1 CAPSULE BY MOUTH EVERY DAY         Objective:   BP 111/70   Pulse (!) 59   Ht 5' 8.5" (1.74 m)   Wt 171 lb (77.6 kg)   SpO2 100%   BMI 25.62 kg/m   Wt Readings from Last 3 Encounters:  01/31/21 171 lb (77.6 kg)  01/09/21 171 lb 3.2 oz (77.7 kg)  01/01/21 168 lb 9.6 oz (76.5 kg)    Physical Exam Vitals and nursing note reviewed.  Constitutional:      General: He is not in acute distress.    Appearance: He is well-developed. He is not diaphoretic.  Eyes:     General: No scleral icterus.    Conjunctiva/sclera: Conjunctivae normal.  Neck:     Thyroid: No thyromegaly.  Cardiovascular:     Rate and Rhythm: Normal rate and regular rhythm.     Heart sounds: Normal heart sounds. No murmur heard. Pulmonary:     Effort: Pulmonary effort is normal. No respiratory distress.     Breath sounds: Normal breath sounds. No wheezing.  Musculoskeletal:        General: Normal range of motion.     Cervical back: Neck supple.  Lymphadenopathy:     Cervical: No cervical adenopathy.  Skin:    General: Skin is warm and dry.     Findings: No rash.  Neurological:     Mental Status: He is alert and oriented to person, place, and time.     Coordination: Coordination normal.  Psychiatric:        Behavior: Behavior normal.      Assessment & Plan:   Problem List Items Addressed This Visit       Cardiovascular and Mediastinum   Essential hypertension, benign     Other   Hyperlipidemia with target LDL less than 70   Hyperglycemia   Other Visit Diagnoses     Type 2 diabetes mellitus with  other specified complication, with long-term current use of insulin ([place])    -  Primary   Relevant Orders   Bayer DCA Hb A1c Waived (Completed)       Patient doing well, continue current medication. Follow up plan: Return in about 6 months (around 07/31/2021), or if symptoms worsen or fail to improve, for Prediabetes and hypertension and cholesterol.  Counseling provided for all of the vaccine components Orders Placed This Encounter  Procedures   Bayer [place] Hb A1c [place] Magenta Schmiesing, MD [facility] 01/31/2021,  10:50 PM

## 2021-02-01 ENCOUNTER — Ambulatory Visit (INDEPENDENT_AMBULATORY_CARE_PROVIDER_SITE_OTHER): Payer: Medicare HMO

## 2021-02-01 VITALS — Ht 69.0 in | Wt 171.0 lb

## 2021-02-01 DIAGNOSIS — Z0000 Encounter for general adult medical examination without abnormal findings: Secondary | ICD-10-CM | POA: Diagnosis not present

## 2021-02-01 NOTE — Patient Instructions (Signed)
[name] , Thank you for taking time to come for your Medicare Wellness Visit. I appreciate your ongoing commitment to your health goals. Please review the following plan we discussed and let me know if I can assist you in the future.   Screening recommendations/referrals: Colonoscopy: No longer required Recommended yearly ophthalmology/optometry visit for glaucoma screening and checkup Recommended yearly dental visit for hygiene and checkup  Vaccinations: Influenza vaccine: Done 03/02/2020 - Repeat annually Pneumococcal vaccine: Done 11/01/2008 & 02/14/2015 Tdap vaccine: Done 08/30/2014 - Repeat in 10 years Shingles vaccine: Zostavax done 2015, Shingrix discussed. Please contact your pharmacy for coverage information.     Covid-19: Done 07/29/19, 08/27/19, 03/29/20, & 12/26/20  Advanced directives: Please bring a copy of your health care power of attorney and living will to the office to be added to your chart at your convenience.   Conditions/risks identified: Aim for 30 minutes of exercise or brisk walking each day, drink 6-8 glasses of water and eat lots of fruits and vegetables.   Next appointment: Follow up in one year for your annual wellness visit.   Preventive Care 85 Years and Older, Male  Preventive care refers to lifestyle choices and visits with your health care provider that can promote health and wellness. What does preventive care include? A yearly physical exam. This is also called an annual well check. Dental exams once or twice a year. Routine eye exams. Ask your health care provider how often you should have your eyes checked. Personal lifestyle choices, including: Daily care of your teeth and gums. Regular physical activity. Eating a healthy diet. Avoiding tobacco and drug use. Limiting alcohol use. Practicing safe sex. Taking low doses of aspirin every day. Taking vitamin and mineral supplements as recommended by your health care provider. What happens during  an annual well check? The services and screenings done by your health care provider during your annual well check will depend on your age, overall health, lifestyle risk factors, and family history of disease. Counseling  Your health care provider may ask you questions about your: Alcohol use. Tobacco use. Drug use. Emotional well-being. Home and relationship well-being. Sexual activity. Eating habits. History of falls. Memory and ability to understand (cognition). Work and work Statistician. Screening  You may have the following tests or measurements: Height, weight, and BMI. Blood pressure. Lipid and cholesterol levels. These may be checked every 5 years, or more frequently if you are over 85 years old. Skin check. Lung cancer screening. You may have this screening every year starting at age 85 if you have a 30-pack-year history of smoking and currently smoke or have quit within the past 15 years. Fecal occult blood test (FOBT) of the stool. You may have this test every year starting at age 85. Flexible sigmoidoscopy or colonoscopy. You may have a sigmoidoscopy every 5 years or a colonoscopy every 10 years starting at age 85. Prostate cancer screening. Recommendations will vary depending on your family history and other risks. Hepatitis C blood test. Hepatitis B blood test. Sexually transmitted disease (STD) testing. Diabetes screening. This is done by checking your blood sugar (glucose) after you have not eaten for a while (fasting). You may have this done every 1-3 years. Abdominal aortic aneurysm (AAA) screening. You may need this if you are a current or former smoker. Osteoporosis. You may be screened starting at age 85 if you are at high risk. Talk with your health care provider about your test results, treatment options, and if necessary, the need  for more tests. Vaccines  Your health care provider may recommend certain vaccines, such as: Influenza vaccine. This is recommended  every year. Tetanus, diphtheria, and acellular pertussis (Tdap, Td) vaccine. You may need a Td booster every 10 years. Zoster vaccine. You may need this after age 85. Pneumococcal 13-valent conjugate (PCV13) vaccine. One dose is recommended after age 85. Pneumococcal polysaccharide (PPSV23) vaccine. One dose is recommended after age 85. Talk to your health care provider about which screenings and vaccines you need and how often you need them. This information is not intended to replace advice given to you by your health care provider. Make sure you discuss any questions you have with your health care provider. Document Released: 06/16/2015 Document Revised: 02/07/2016 Document Reviewed: 03/21/2015 Elsevier Interactive Patient Education  2017 [place] Prevention in the Home Falls can cause injuries. They can happen to people of all ages. There are many things you can do to make your home safe and to help prevent falls. What can I do on the outside of my home? Regularly fix the edges of walkways and driveways and fix any cracks. Remove anything that might make you trip as you walk through a door, such as a raised step or threshold. Trim any bushes or trees on the path to your home. Use bright outdoor lighting. Clear any walking paths of anything that might make someone trip, such as rocks or tools. Regularly check to see if handrails are loose or broken. Make sure that both sides of any steps have handrails. Any raised decks and porches should have guardrails on the edges. Have any leaves, snow, or ice cleared regularly. Use sand or salt on walking paths during winter. Clean up any spills in your garage right away. This includes oil or grease spills. What can I do in the bathroom? Use night lights. Install grab bars by the toilet and in the tub and shower. Do not use towel bars as grab bars. Use non-skid mats or decals in the tub or shower. If you need to sit down in the shower,  use a plastic, non-slip stool. Keep the floor dry. Clean up any water that spills on the floor as soon as it happens. Remove soap buildup in the tub or shower regularly. Attach bath mats securely with double-sided non-slip rug tape. Do not have throw rugs and other things on the floor that can make you trip. What can I do in the bedroom? Use night lights. Make sure that you have a light by your bed that is easy to reach. Do not use any sheets or blankets that are too big for your bed. They should not hang down onto the floor. Have a firm chair that has side arms. You can use this for support while you get dressed. Do not have throw rugs and other things on the floor that can make you trip. What can I do in the kitchen? Clean up any spills right away. Avoid walking on wet floors. Keep items that you use a lot in easy-to-reach places. If you need to reach something above you, use a strong step stool that has a grab bar. Keep electrical cords out of the way. Do not use floor polish or wax that makes floors slippery. If you must use wax, use non-skid floor wax. Do not have throw rugs and other things on the floor that can make you trip. What can I do with my stairs? Do not leave any items on the stairs.  Make sure that there are handrails on both sides of the stairs and use them. Fix handrails that are broken or loose. Make sure that handrails are as long as the stairways. Check any carpeting to make sure that it is firmly attached to the stairs. Fix any carpet that is loose or worn. Avoid having throw rugs at the top or bottom of the stairs. If you do have throw rugs, attach them to the floor with carpet tape. Make sure that you have a light switch at the top of the stairs and the bottom of the stairs. If you do not have them, ask someone to add them for you. What else can I do to help prevent falls? Wear shoes that: Do not have high heels. Have rubber bottoms. Are comfortable and fit you  well. Are closed at the toe. Do not wear sandals. If you use a stepladder: Make sure that it is fully opened. Do not climb a closed stepladder. Make sure that both sides of the stepladder are locked into place. Ask someone to hold it for you, if possible. Clearly mark and make sure that you can see: Any grab bars or handrails. First and last steps. Where the edge of each step is. Use tools that help you move around (mobility aids) if they are needed. These include: Canes. Walkers. Scooters. Crutches. Turn on the lights when you go into a dark area. Replace any light bulbs as soon as they burn out. Set up your furniture so you have a clear path. Avoid moving your furniture around. If any of your floors are uneven, fix them. If there are any pets around you, be aware of where they are. Review your medicines with your doctor. Some medicines can make you feel dizzy. This can increase your chance of falling. Ask your doctor what other things that you can do to help prevent falls. This information is not intended to replace advice given to you by your health care provider. Make sure you discuss any questions you have with your health care provider. Document Released: 03/16/2009 Document Revised: 10/26/2015 Document Reviewed: 06/24/2014 Elsevier Interactive Patient Education  2017 [employer].

## 2021-02-01 NOTE — Progress Notes (Signed)
Subjective:   Huston Dittus is a 87 y.o. male who presents for Medicare Annual/Subsequent preventive examination.  Virtual Visit via Telephone Note  I connected with  Loraine Grip on 02/01/21 at  8:15 AM EDT by telephone and verified that I am speaking with the correct person using two identifiers.  Location: Patient: Home Provider: [facility] Persons participating in the virtual visit: patient/wife, [place]   I discussed the limitations, risks, security and privacy concerns of performing an evaluation and management service by telephone and the availability of in person appointments. The patient expressed understanding and agreed to proceed.  Interactive audio and video telecommunications were attempted between this nurse and patient, however failed, due to patient having technical difficulties OR patient did not have access to video capability.  We continued and completed visit with audio only.  Some vital signs may be absent or patient reported.   Sheryn Aldaz E Ashia Dehner, LPN   Review of Systems     Cardiac Risk Factors include: advanced age (>43mn, >>10women);sedentary lifestyle;dyslipidemia;hypertension;male gender;Other (see comment), Risk factor comments: atherosclerosis, aortic aneurysm, peripheral vascular disease     Objective:    Today's Vitals   02/01/21 0812  Weight: 171 lb (77.6 kg)  Height: '5\' 9"'$  (1.753 m)   Body mass index is 25.25 kg/m.  Advanced Directives 02/01/2021 01/09/2021 10/25/2020 09/04/2020 07/19/2020 04/11/2020 03/23/2020  Does Patient Have a Medical Advance Directive? Yes Yes Yes Yes Yes Yes Yes  Type of AParamedicof [place] will [place] will [place] will [place]Living will Living will;Healthcare Power of [place]land HeightsLiving will Living will;Healthcare Power of Attorney  Does patient want to make changes to  medical advance directive? - No - Patient declined No - Patient declined No - Patient declined No - Patient declined No - Patient declined No - Patient declined  Copy of HIthacain Chart? No - copy requested No - copy requested No - copy requested No - copy requested No - copy requested No - copy requested -  Would patient like information on creating a medical advance directive? - - No - Patient declined No - Patient declined No - Patient declined No - Patient declined No - Patient declined    Current Medications (verified) Outpatient Encounter Medications as of 02/01/2021  Medication Sig   acetaminophen (TYLENOL) 325 MG tablet Take 650 mg by mouth every 6 (six) hours as needed for moderate pain or headache.   amLODIPine-Valsartan-HCTZ (EXFORGE HCT) 5-160-25 MG TABS Take 1 tablet by mouth every morning.   aspirin EC 81 MG tablet Take 81 mg by mouth every morning.   atorvastatin (LIPITOR) 20 MG tablet TAKE 1 TABLET BY MOUTH EVERY DAY   clopidogrel (PLAVIX) 75 MG tablet TAKE 1 TABLET BY MOUTH EVERY DAY   donepezil (ARICEPT) 10 MG tablet Take 10 mg by mouth at bedtime.    hydroxyurea (HYDREA) 500 MG capsule Take 1 capsule (500 mg total) by mouth daily. 2 tablets on Monday, Wednesday and Friday and 1 tablet daily rest of the week.   Multiple Vitamin (MULTIVITAMIN WITH MINERALS) TABS tablet Take 1 tablet by mouth daily.   OLANZapine zydis (ZYPREXA) 10 MG disintegrating tablet Take 1 tablet (10 mg total) by mouth at bedtime.   Polyethyl Glycol-Propyl Glycol (SYSTANE OP) Place 1 drop into both eyes daily.   tamsulosin (FLOMAX) 0.4 MG CAPS capsule TAKE 1 CAPSULE BY MOUTH EVERY DAY   metoprolol  succinate (TOPROL-XL) 50 MG 24 hr tablet Take 1 tablet (50 mg total) by mouth daily. Take with or immediately following a meal.   nitroGLYCERIN (NITROSTAT) 0.4 MG SL tablet DISSOLVE 1 TABLET UNDER TONGUE EVERY 5 MIN AS NEEDED FOR CHEST PAIN, MAX 3 DOSES IN 15 MIN   [DISCONTINUED] ONETOUCH VERIO  test strip TEST BLOOD SUGAR DAILY DX E11.9   No facility-administered encounter medications on file as of 02/01/2021.    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   LIMA to LAD, SVG to D1, SVG to OM 1 and SVG to RI 05/19/2019   Type II diabetes mellitus ([place])    Past Surgical History:  Procedure Laterality Date   BACK SURGERY     CATARACT EXTRACTION W/ INTRAOCULAR LENS  IMPLANT, BILATERAL Bilateral ~ 2014   CORONARY ANGIOPLASTY WITH STENT PLACEMENT  08/2010; 04/26/2014   "1, LAD; 1"   CORONARY ARTERY BYPASS GRAFT N/A 05/19/2019   Procedure: CORONARY ARTERY BYPASS GRAFTING (CABG), on pump, times four using left internal mammary artery and right greater saphenous vein harvested endoscopically;  Surgeon: Lajuana Matte, MD;  Location: [place];  Service: Open Heart Surgery;  Laterality: N/A;   LEFT HEART CATH AND CORONARY ANGIOGRAPHY N/A 05/18/2019   Procedure: LEFT HEART CATH AND CORONARY ANGIOGRAPHY;  Surgeon: Adrian Prows, MD;  Location: [facility];  Service: Cardiovascular;  Laterality: N/A;   LEFT HEART CATHETERIZATION WITH CORONARY ANGIOGRAM N/A 04/26/2014   Procedure: LEFT HEART CATHETERIZATION WITH CORONARY ANGIOGRAM;  Surgeon: Laverda Page, MD;  [facility];  Service: Cardiovascular;  Laterality: N/A;   LUMBAR LAMINECTOMY/DECOMPRESSION MICRODISCECTOMY  01/2010   Family History  Problem Relation Age of Onset   Cancer Sister    Social History   Socioeconomic History   Marital status: Married    Spouse name: Clarise Cruz   Number of children: 6   Years of education: 3   Highest education level: 3rd grade  Occupational History    Occupation: retired  Tobacco Use   Smoking status: Former    Packs/day: 0.12    Years: 10.00    Pack years: 1.20    Types: Cigarettes    Quit date: 09/04/1982    Years since quitting: 38.4   Smokeless tobacco: Never  Vaping Use   Vaping Use: Never used  Substance and Sexual Activity   Alcohol use: No   Drug use: No   Sexual activity: Yes  Other Topics Concern   Not on file  Social History Narrative   Lives home with his wife - has worsening dementia   All children live within 67 miles   Social Determinants of Health   Financial Resource Strain: Low Risk    Difficulty of Paying Living Expenses: Not hard at all  Food Insecurity: No Food Insecurity   Worried About Charity fundraiser in the Last Year: Never true   [place] in the Last Year: Never true  Transportation Needs: No Transportation Needs   Lack of Transportation (Medical): No   Lack of Transportation (Non-Medical): No  Physical Activity: Insufficiently  Active   Days of Exercise per Week: 7 days   Minutes of Exercise per Session: 20 min  Stress: No Stress Concern Present   Feeling of Stress : Not at all  Social Connections: Moderately Integrated   Frequency of Communication with Friends and Family: Twice a week   Frequency of Social Gatherings with Friends and Family: More than three times a week   Attends Religious Services: More than 4 times per year   Active Member of [employer] or Organizations: No   Attends Music therapist: Never   Marital Status: Married    Tobacco Counseling Counseling given: Not Answered   Clinical Intake:  Pre-visit preparation completed: Yes  Pain : No/denies pain     BMI - recorded: 25.25 Nutritional Status: BMI 25 -29 Overweight Nutritional Risks: None Diabetes: No  How often do you need to have someone help you when you read instructions, pamphlets, or other written materials from your doctor or pharmacy?: 5 - Always  Diabetic? No  Interpreter  Needed?: No  Information entered by :: Dellis Voght, LPN   Activities of Daily Living In your present state of health, do you have any difficulty performing the following activities: 02/01/2021  Hearing? N  Vision? N  Difficulty concentrating or making decisions? Y  Walking or climbing stairs? Y  Dressing or bathing? Y  Doing errands, shopping? Y  Preparing Food and eating ? Y  Using the Toilet? N  In the past six months, have you accidently leaked urine? N  Do you have problems with loss of bowel control? N  Managing your Medications? Y  Managing your Finances? Y  Housekeeping or managing your Housekeeping? Y  Some recent data might be hidden    Patient Care Team: Dettinger, [name], MD as PCP - General (Family Medicine) Adrian Prows, MD as PCP - Cardiology (Cardiology) Adrian Prows, MD as Attending Physician (Cardiology) [place], Heather, [place] (Optometry)  Indicate any recent Medical Services you may have received from other than [facility] providers in the past year (date may be approximate).     Assessment:   This is a routine wellness examination for [place].  Hearing/Vision screen Hearing Screening - Comments:: Denies hearing difficulties  Vision Screening - Comments:: Wears eyeglasses - Behind on annual eye exams with Dr [place] in [place]  Dietary issues and exercise activities discussed: Current Exercise Habits: Home exercise routine, Type of exercise: walking, Time (Minutes): 20, Frequency (Times/Week): 7, Weekly Exercise (Minutes/Week): 140, Intensity: Mild, Exercise limited by: cardiac condition(s);psychological condition(s);orthopedic condition(s)   Goals Addressed             This Visit's Progress    Have 3 meals a day   On track      Depression Screen PHQ 2/9 Scores 02/01/2021 01/31/2021 06/01/2020 02/01/2020 02/26/2019 02/03/2019 01/28/2019  PHQ - 2 Score 0 0 0 0 0 0 0  PHQ- 9 Score - - - - 0 - -    Fall Risk Fall Risk  02/01/2021 01/31/2021 06/01/2020 02/01/2020  02/26/2019  Falls in the past year? 0 0 0 0 0  Number falls in past yr: 0 - - - -  Injury with Fall? 0 - - - -  Risk for fall due to : Impaired balance/gait;Impaired vision;Mental status change - - - -  Follow up Education provided;Falls prevention discussed - - - -  Comment - - - - -    FALL RISK PREVENTION PERTAINING TO THE HOME:  Any stairs in or around the  home? No  If so, are there any without handrails? No  Home free of loose throw rugs in walkways, pet beds, electrical cords, etc? Yes  Adequate lighting in your home to reduce risk of falls? Yes   ASSISTIVE DEVICES UTILIZED TO PREVENT FALLS:  Life alert? No  Use of a cane, walker or w/c? Yes  Grab bars in the bathroom? Yes  Shower chair or bench in shower? No  Elevated toilet seat or a handicapped toilet? Yes   TIMED UP AND GO:  Was the test performed? No . Telephonic visit  Cognitive Function: Cognitive status assessed by direct observation. Patient has current diagnosis of cognitive impairment. Patient is followed by neurology for ongoing assessment. Patient is unable to complete screening 6CIT or MMSE.   MMSE - Mini Mental State Exam 12/09/2017  Orientation to time 5  Orientation to Place 5  Registration 3  Attention/ Calculation 0  Recall 0  Language- name 2 objects 2  Language- repeat 1  Language- follow 3 step command 3  Language- read & follow direction 1  Write a sentence 1  Copy design 1  Total score 22     6CIT Screen 02/01/2020 01/28/2019  What Year? 4 points 0 points  What month? 3 points 0 points  What time? 0 points 0 points  Count back from 20 4 points 4 points  Months in reverse 0 points 4 points  Repeat phrase 8 points 4 points  Total Score 19 12    Immunizations Immunization History  Administered Date(s) Administered   Fluad Quad(high Dose 65+) 02/03/2019, 03/02/2020   Influenza, High Dose Seasonal PF 04/07/2015, 03/20/2016, 03/24/2017, 03/20/2018   Influenza,inj,Quad PF,6+ Mos  03/04/2013, 04/27/2014   Moderna SARS-COV2 Booster Vaccination 12/26/2020   Moderna Sars-Covid-2 Vaccination 07/29/2019, 08/27/2019, 03/29/2020   Pneumococcal Conjugate-13 02/14/2015   Pneumococcal-Unspecified 11/01/2008   Tdap 08/30/2014   Zoster, Live 11/29/2013    TDAP status: Up to date  Flu Vaccine status: Up to date  Pneumococcal vaccine status: Up to date  Covid-19 vaccine status: Completed vaccines  Qualifies for Shingles Vaccine? Yes   Zostavax completed Yes   Shingrix Completed?: No.    Education has been provided regarding the importance of this vaccine. Patient has been advised to call insurance company to determine out of pocket expense if they have not yet received this vaccine. Advised may also receive vaccine at local pharmacy or Health Dept. Verbalized acceptance and understanding.  Screening Tests Health Maintenance  Topic Date Due   INFLUENZA VACCINE  01/01/2021   OPHTHALMOLOGY EXAM  05/07/2021 (Originally 11/02/2015)   Zoster Vaccines- Shingrix (1 of 2) 06/04/2021 (Originally 06/03/1953)   COVID-19 Vaccine (5 - Booster for Moderna series) 04/28/2021   FOOT EXAM  06/01/2021   HEMOGLOBIN A1C  07/31/2021   TETANUS/TDAP  08/29/2024   PNA vac Low Risk Adult  Completed   HPV VACCINES  Aged Out    Health Maintenance  Health Maintenance Due  Topic Date Due   INFLUENZA VACCINE  01/01/2021    Colorectal cancer screening: No longer required.   Lung Cancer Screening: (Low Dose CT Chest recommended if Age 67-80 years, 30 pack-year currently smoking OR have quit w/in 15years.) does not qualify.  Additional Screening:  Hepatitis C Screening: does not qualify  Vision Screening: Recommended annual ophthalmology exams for early detection of glaucoma and other disorders of the eye. Is the patient up to date with their annual eye exam?  No  Who is the provider or what is  the name of the office in which the patient attends annual eye exams? Dr [place] in [place] If  pt is not established with a provider, would they like to be referred to a provider to establish care? No .   Dental Screening: Recommended annual dental exams for proper oral hygiene  Community Resource Referral / Chronic Care Management: CRR required this visit?  No   CCM required this visit?  No      Plan:     I have personally reviewed and noted the following in the patient's chart:   Medical and social history Use of alcohol, tobacco or illicit drugs  Current medications and supplements including opioid prescriptions. Patient is not currently taking opioid prescriptions. Functional ability and status Nutritional status Physical activity Advanced directives List of other physicians Hospitalizations, surgeries, and ER visits in previous 12 months Vitals Screenings to include cognitive, depression, and falls Referrals and appointments  In addition, I have reviewed and discussed with patient certain preventive protocols, quality metrics, and best practice recommendations. A written personalized care plan for preventive services as well as general preventive health recommendations were provided to patient.     Sandrea Hammond, LPN   [id number]   Nurse Notes: None

## 2021-02-07 ENCOUNTER — Other Ambulatory Visit: Payer: Self-pay | Admitting: Family Medicine

## 2021-02-07 DIAGNOSIS — E785 Hyperlipidemia, unspecified: Secondary | ICD-10-CM

## 2021-02-07 DIAGNOSIS — I2510 Atherosclerotic heart disease of native coronary artery without angina pectoris: Secondary | ICD-10-CM

## 2021-02-11 ENCOUNTER — Other Ambulatory Visit (HOSPITAL_COMMUNITY): Payer: Self-pay | Admitting: Hematology

## 2021-02-11 DIAGNOSIS — D75839 Thrombocytosis, unspecified: Secondary | ICD-10-CM

## 2021-02-12 ENCOUNTER — Other Ambulatory Visit: Payer: Self-pay | Admitting: Cardiology

## 2021-02-12 DIAGNOSIS — I10 Essential (primary) hypertension: Secondary | ICD-10-CM

## 2021-02-12 DIAGNOSIS — I25118 Atherosclerotic heart disease of native coronary artery with other forms of angina pectoris: Secondary | ICD-10-CM

## 2021-03-06 ENCOUNTER — Encounter: Payer: Self-pay | Admitting: Family

## 2021-03-06 ENCOUNTER — Ambulatory Visit (INDEPENDENT_AMBULATORY_CARE_PROVIDER_SITE_OTHER): Payer: Medicare HMO | Admitting: Family

## 2021-03-06 ENCOUNTER — Other Ambulatory Visit: Payer: Self-pay

## 2021-03-06 VITALS — BP 119/72 | HR 58 | Temp 98.0°F | Ht 69.0 in

## 2021-03-06 DIAGNOSIS — M25561 Pain in right knee: Secondary | ICD-10-CM | POA: Diagnosis not present

## 2021-03-06 DIAGNOSIS — M1711 Unilateral primary osteoarthritis, right knee: Secondary | ICD-10-CM

## 2021-03-06 DIAGNOSIS — G8929 Other chronic pain: Secondary | ICD-10-CM

## 2021-03-06 MED ORDER — LIDOCAINE HCL 1 % IJ SOLN: 1.0000 mL | Freq: Once | INTRAMUSCULAR | Status: AC

## 2021-03-06 MED ORDER — METHYLPREDNISOLONE ACETATE 40 MG/ML IJ SUSP: 40.0000 mg | Freq: Once | INTRAMUSCULAR | Status: AC

## 2021-03-06 NOTE — Progress Notes (Signed)
Subjective:    Patient ID: Joseph Byrd, male    DOB: 11/03/1933, 87 y.o.   MRN: [id number]  Chief Complaint  Patient presents with   Joint Swelling    Right knee pain and swelling x 3 weeks    PT presents to the office today with right knee swelling and pain that has been on going for year. However, it has worsen over the last three weeks.  Denies any injury.  Knee Pain  The incident occurred more than 1 week ago. There was no injury mechanism. The pain is present in the right knee. The quality of the pain is described as aching. The pain is at a severity of 10/10. The pain is moderate. Pertinent negatives include no loss of motion, loss of sensation, numbness or tingling. He reports no foreign bodies present. The symptoms are aggravated by movement and weight bearing. He has tried acetaminophen for the symptoms. The treatment provided mild relief.     Review of Systems  Neurological:  Negative for tingling and numbness.  All other systems reviewed and are negative.     Objective:   Physical Exam Vitals reviewed.  Constitutional:      General: He is not in acute distress.    Appearance: He is well-developed.  HENT:     Head: Normocephalic.  Eyes:     General:        Right eye: No discharge.        Left eye: No discharge.     Pupils: Pupils are equal, round, and reactive to light.  Neck:     Thyroid: No thyromegaly.  Cardiovascular:     Rate and Rhythm: Normal rate and regular rhythm.     Heart sounds: Normal heart sounds. No murmur heard. Pulmonary:     Effort: Pulmonary effort is normal. No respiratory distress.     Breath sounds: Normal breath sounds. No wheezing.  Abdominal:     General: Bowel sounds are normal. There is no distension.     Palpations: Abdomen is soft.     Tenderness: There is no abdominal tenderness.  Musculoskeletal:        General: Tenderness (pain in right knee with flexion and extension, no swelling noted.) present.     Cervical back:  Normal range of motion and neck supple.  Skin:    General: Skin is warm and dry.     Findings: No erythema or rash.  Neurological:     Mental Status: He is alert and oriented to person, place, and time.     Cranial Nerves: No cranial nerve deficit.     Motor: Weakness (wheelchair) present.     Deep Tendon Reflexes: Reflexes are normal and symmetric.  Psychiatric:        Mood and Affect: Affect is flat.        Behavior: Behavior normal.        Thought Content: Thought content normal.        Judgment: Judgment normal.   rightknee prepped with betadine Injected with Marcaine .5% plain and methylprednisolone with 25 guage needle x 1. Patient tolerated well.   BP 119/72   Pulse (!) 58   Temp 98 F (36.7 C) (Temporal)   Ht 5\' 9"  (1.753 m)   BMI 25.25 kg/m       Assessment & Plan:  Dajon Lazar comes in today with chief complaint of Joint Swelling (Right knee pain and swelling x 3 weeks )   Diagnosis and orders addressed:  1. Chronic pain of right knee - methylPREDNISolone acetate (DEPO-MEDROL) injection 40 mg - lidocaine (XYLOCAINE) 1 % (with pres) injection 1 mL  2. Primary osteoarthritis of right knee - methylPREDNISolone acetate (DEPO-MEDROL) injection 40 mg - lidocaine (XYLOCAINE) 1 % (with pres) injection 1 mL  Rest Ice  ROM exercise  Motrin as needed Keep follow up with PCP   Evelina Dun, FNP

## 2021-03-06 NOTE — Patient Instructions (Signed)
Osteoarthritis Osteoarthritis is a type of arthritis. It refers to joint pain or joint disease. Osteoarthritis affects tissue that covers the ends of bones in joints (cartilage). Cartilage acts as a cushion between the bones and helps them move smoothly. Osteoarthritis occurs when cartilage in the joints gets worn down. Osteoarthritis is sometimes called "wear and tear" arthritis. Osteoarthritis is the most common form of arthritis. It often occurs in older people. It is a condition that gets worse over time. The joints most often affected by this condition are in the fingers, toes, hips, knees, and spine, including the neck and lower back. What are the causes? This condition is caused by the wearing down of cartilage that covers the ends of bones. What increases the risk? The following factors may make you more likely to develop this condition: Being age 50 or older. Obesity. Overuse of joints. Past injury of a joint. Past surgery on a joint. Family history of osteoarthritis. What are the signs or symptoms? The main symptoms of this condition are pain, swelling, and stiffness in the joint. Other symptoms may include: An enlarged joint. More pain and further damage caused by small pieces of bone or cartilage that break off and float inside of the joint. Small deposits of bone (osteophytes) that grow on the edges of the joint. A grating or scraping feeling inside the joint when you move it. Popping or creaking sounds when you move. Difficulty walking or exercising. An inability to grip items, twist your hand(s), or control the movements of your hands and fingers. How is this diagnosed? This condition may be diagnosed based on: Your medical history. A physical exam. Your symptoms. X-rays of the affected joint(s). Blood tests to rule out other types of arthritis. How is this treated? There is no cure for this condition, but treatment can help control pain and improve joint function.  Treatment may include a combination of therapies, such as: Pain relief techniques, such as: Applying heat and cold to the joint. Massage. A form of talk therapy called cognitive behavioral therapy (CBT). This therapy helps you set goals and follow up on the changes that you make. Medicines for pain and inflammation. The medicines can be taken by mouth or applied to the skin. They include: NSAIDs, such as ibuprofen. Prescription medicines. Strong anti-inflammatory medicines (corticosteroids). Certain nutritional supplements. A prescribed exercise program. You may work with a physical therapist. Assistive devices, such as a brace, wrap, splint, specialized glove, or cane. A weight control plan. Surgery, such as: An osteotomy. This is done to reposition the bones and relieve pain or to remove loose pieces of bone and cartilage. Joint replacement surgery. You may need this surgery if you have advanced osteoarthritis. Follow these instructions at home: Activity Rest your affected joints as told by your health care provider. Exercise as told by your health care provider. He or she may recommend specific types of exercise, such as: Strengthening exercises. These are done to strengthen the muscles that support joints affected by arthritis. Aerobic activities. These are exercises, such as brisk walking or water aerobics, that increase your heart rate. Range-of-motion activities. These help your joints move more easily. Balance and agility exercises. Managing pain, stiffness, and swelling   If directed, apply heat to the affected area as often as told by your health care provider. Use the heat source that your health care provider recommends, such as a moist heat pack or a heating pad. If you have a removable assistive device, remove it as told by   your health care provider. Place a towel between your skin and the heat source. If your health care provider tells you to keep the assistive device on  while you apply heat, place a towel between the assistive device and the heat source. Leave the heat on for 20-30 minutes. Remove the heat if your skin turns bright red. This is especially important if you are unable to feel pain, heat, or cold. You may have a greater risk of getting burned. If directed, put ice on the affected area. To do this: If you have a removable assistive device, remove it as told by your health care provider. Put ice in a plastic bag. Place a towel between your skin and the bag. If your health care provider tells you to keep the assistive device on during icing, place a towel between the assistive device and the bag. Leave the ice on for 20 minutes, 2-3 times a day. Move your fingers or toes often to reduce stiffness and swelling. Raise (elevate) the injured area above the level of your heart while you are sitting or lying down. General instructions Take over-the-counter and prescription medicines only as told by your health care provider. Maintain a healthy weight. Follow instructions from your health care provider for weight control. Do not use any products that contain nicotine or tobacco, such as cigarettes, e-cigarettes, and chewing tobacco. If you need help quitting, ask your health care provider. Use assistive devices as told by your health care provider. Keep all follow-up visits as told by your health care provider. This is important. Where to find more information National Institute of Arthritis and Musculoskeletal and Skin Diseases: www.niams.nih.gov National Institute on Aging: www.nia.nih.gov American College of Rheumatology: www.rheumatology.org Contact a health care provider if: You have redness, swelling, or a feeling of warmth in a joint that gets worse. You have a fever along with joint or muscle aches. You develop a rash. You have trouble doing your normal activities. Get help right away if: You have pain that gets worse and is not relieved by  pain medicine. Summary Osteoarthritis is a type of arthritis that affects tissue covering the ends of bones in joints (cartilage). This condition is caused by the wearing down of cartilage that covers the ends of bones. The main symptom of this condition is pain, swelling, and stiffness in the joint. There is no cure for this condition, but treatment can help control pain and improve joint function. This information is not intended to replace advice given to you by your health care provider. Make sure you discuss any questions you have with your health care provider. Document Revised: 05/17/2019 Document Reviewed: 05/17/2019 Elsevier Patient Education  2022 [employer] Inc.  

## 2021-03-23 ENCOUNTER — Other Ambulatory Visit: Payer: Self-pay | Admitting: Family Medicine

## 2021-04-07 ENCOUNTER — Other Ambulatory Visit: Payer: Self-pay | Admitting: Cardiology

## 2021-04-23 ENCOUNTER — Other Ambulatory Visit: Payer: Self-pay

## 2021-04-23 ENCOUNTER — Encounter: Payer: Self-pay | Admitting: Nurse Practitioner

## 2021-04-23 ENCOUNTER — Ambulatory Visit (INDEPENDENT_AMBULATORY_CARE_PROVIDER_SITE_OTHER): Payer: Medicare HMO | Admitting: Nurse Practitioner

## 2021-04-23 VITALS — BP 105/67 | HR 60 | Temp 97.5°F | Resp 20

## 2021-04-23 DIAGNOSIS — R69 Illness, unspecified: Secondary | ICD-10-CM | POA: Diagnosis not present

## 2021-04-23 DIAGNOSIS — I10 Essential (primary) hypertension: Secondary | ICD-10-CM

## 2021-04-23 DIAGNOSIS — M545 Low back pain, unspecified: Secondary | ICD-10-CM | POA: Diagnosis not present

## 2021-04-23 DIAGNOSIS — R600 Localized edema: Secondary | ICD-10-CM

## 2021-04-23 DIAGNOSIS — Z23 Encounter for immunization: Secondary | ICD-10-CM

## 2021-04-23 DIAGNOSIS — F5101 Primary insomnia: Secondary | ICD-10-CM

## 2021-04-23 MED ORDER — FUROSEMIDE 20 MG PO TABS: 20.0000 mg | ORAL_TABLET | Freq: Every day | ORAL | 3 refills | Status: AC

## 2021-04-23 MED ORDER — VALSARTAN 160 MG PO TABS: 160.0000 mg | ORAL_TABLET | Freq: Every day | ORAL | 3 refills | Status: AC

## 2021-04-23 MED ORDER — AMLODIPINE BESYLATE 5 MG PO TABS
5.0000 mg | ORAL_TABLET | Freq: Every day | ORAL | 1 refills | Status: AC
Start: 2021-04-23 — End: ?

## 2021-04-23 NOTE — Patient Instructions (Signed)
Peripheral Edema °Peripheral edema is swelling that is caused by a buildup of fluid. Peripheral edema most often affects the lower legs, ankles, and feet. It can also develop in the arms, hands, and face. The area of the body that has peripheral edema will look swollen. It may also feel heavy or warm. Your clothes may start to feel tight. Pressing on the area may make a temporary dent in your skin. You may not be able to move your swollen arm or leg as much as usual. °There are many causes of peripheral edema. It can happen because of a complication of other conditions such as congestive heart failure, kidney disease, or a problem with your blood circulation. It also can be a side effect of certain medicines or because of an infection. It often happens to women during pregnancy. Sometimes, the cause is not known. °Follow these instructions at home: °Managing pain, stiffness, and swelling ° °Raise (elevate) your legs while you are sitting or lying down. °Move around often to prevent stiffness and to lessen swelling. °Do not sit or stand for long periods of time. °Wear support stockings as told by your health care provider. °Medicines °Take over-the-counter and prescription medicines only as told by your health care provider. °Your health care provider may prescribe medicine to help your body get rid of excess water (diuretic). °General instructions °Pay attention to any changes in your symptoms. °Follow instructions from your health care provider about limiting salt (sodium) in your diet. Sometimes, eating less salt may reduce swelling. °Moisturize skin daily to help prevent skin from cracking and draining. °Keep all follow-up visits as told by your health care provider. This is important. °Contact a health care provider if you have: °A fever. °Edema that starts suddenly or is getting worse, especially if you are pregnant or have a medical condition. °Swelling in only one leg. °Increased swelling, redness, or pain in  one or both of your legs. °Drainage or sores at the area where you have edema. °Get help right away if you: °Develop shortness of breath, especially when you are lying down. °Have pain in your chest or abdomen. °Feel weak. °Feel faint. °Summary °Peripheral edema is swelling that is caused by a buildup of fluid. Peripheral edema most often affects the lower legs, ankles, and feet. °Move around often to prevent stiffness and to lessen swelling. Do not sit or stand for long periods of time. °Pay attention to any changes in your symptoms. °Contact a health care provider if you have edema that starts suddenly or is getting worse, especially if you are pregnant or have a medical condition. °Get help right away if you develop shortness of breath, especially when lying down. °This information is not intended to replace advice given to you by your health care provider. Make sure you discuss any questions you have with your health care provider. °Document Revised: 10/19/2020 Document Reviewed: 02/11/2018 °Elsevier Patient Education © 2022 Elsevier Inc. ° °

## 2021-04-23 NOTE — Progress Notes (Signed)
 Subjective:    Patient ID: Joseph Byrd, male    DOB: 11/30/1933, 87 y.o.   MRN: [id number]   Chief Complaint: bilateral feet swelling, Back Pain, and Not sleeping   HPI Patient comes in today with several complaints. - Wife states that his feet were swelling last week. Have gotten better but are still swollen. He does not prop his feet up at all at home. He has dementia and diffiucult to keep legs elevated. The only fluid pill he is on is HCTZ. -C/o back pain for the last week but last 2 days he has not complained. His urine was a dark yellow and she has been giving him cranberry juice na dtat has cleared.  - not sleeping- wife says he sleeps a lot during the day. Then at night he is up walking from 11- 4am. Wife says he has to be up with him. He is on zyprexa and that has not helped. Tylenol PM worked one night but she was afraid to continue with that.    Review of Systems  Constitutional:  Negative for diaphoresis.  Eyes:  Negative for pain.  Respiratory:  Negative for shortness of breath.   Cardiovascular:  Negative for chest pain, palpitations and leg swelling.  Gastrointestinal:  Negative for abdominal pain.  Endocrine: Negative for polydipsia.  Musculoskeletal:  Positive for back pain (better).  Skin:  Negative for rash.  Neurological:  Negative for dizziness, weakness and headaches.  Hematological:  Does not bruise/bleed easily.  Psychiatric/Behavioral:  Negative for agitation and behavioral problems.   All other systems reviewed and are negative.     Objective:   Physical Exam Vitals and nursing note reviewed.  Cardiovascular:     Rate and Rhythm: Normal rate and regular rhythm.     Heart sounds: Normal heart sounds.  Pulmonary:     Effort: Pulmonary effort is normal.     Breath sounds: Normal breath sounds.  Musculoskeletal:     Right lower leg: Edema (1+ foot) present.     Left lower leg: Edema (1+ foot) present.     Comments: Sitting in wheel chair   Neurological:     General: No focal deficit present.     Mental Status: He is alert. He is disoriented.   BP 105/67   Pulse 60   Temp (!) 97.5 F (36.4 C) (Temporal)   Resp 20   SpO2 98%         Assessment & Plan:  Martavion Mustard in today with chief complaint of bilateral feet swelling, Back Pain, and Not sleeping   1. Edema of both feet Changed blood pressure meds to d/C HCTZ Elevate legs when can - furosemide (LASIX) 20 MG tablet; Take 1 tablet (20 mg total) by mouth daily.  Dispense: 30 tablet; Refill: 3  2. Primary insomnia Stop zyprexa Tylenol PM will be fine ot use nightly Bedtime routine will help  3. Acute midline low back pain without sciatica Moist heat Tylenol if complains again  4. Essential hypertension, benign Stopped Exforge Changes to norvasc and valsartan spearetely - amLODipine (NORVASC) 5 MG tablet; Take 1 tablet (5 mg total) by mouth daily.  Dispense: 90 tablet; Refill: 1 - valsartan (DIOVAN) 160 MG tablet; Take 1 tablet (160 mg total) by mouth daily.  Dispense: 90 tablet; Refill: 3  Orders Placed This Encounter  Procedures   CMP14+EGFR     The above assessment and management plan was discussed with the patient. The patient verbalized understanding of and has   agreed to the management plan. Patient is aware to call the clinic if symptoms persist or worsen. Patient is aware when to return to the clinic for a follow-up visit. Patient educated on when it is appropriate to go to the emergency department.   Mary-Margaret Hassell Done, FNP

## 2021-04-24 LAB — CMP14+EGFR
ALT: 8 IU/L (ref 0–44)
AST: 17 IU/L (ref 0–40)
Albumin/Globulin Ratio: 1.5 (ref 1.2–2.2)
Albumin: 4.2 g/dL (ref 3.6–4.6)
Alkaline Phosphatase: 75 IU/L (ref 44–121)
BUN/Creatinine Ratio: 19 (ref 10–24)
BUN: 20 mg/dL (ref 8–27)
Bilirubin Total: 0.8 mg/dL (ref 0.0–1.2)
CO2: 26 mmol/L (ref 20–29)
Calcium: 9.3 mg/dL (ref 8.6–10.2)
Chloride: 105 mmol/L (ref 96–106)
Creatinine, Ser: 1.06 mg/dL (ref 0.76–1.27)
Globulin, Total: 2.8 g/dL (ref 1.5–4.5)
Glucose: 96 mg/dL (ref 70–99)
Potassium: 4.5 mmol/L (ref 3.5–5.2)
Sodium: 142 mmol/L (ref 134–144)
Total Protein: 7 g/dL (ref 6.0–8.5)
eGFR: 69 mL/min/{1.73_m2} (ref >59)

## 2021-04-30 ENCOUNTER — Ambulatory Visit: Payer: Medicare HMO | Admitting: Nurse Practitioner

## 2021-04-30 ENCOUNTER — Encounter: Payer: Self-pay | Admitting: Family Medicine

## 2021-04-30 ENCOUNTER — Ambulatory Visit (INDEPENDENT_AMBULATORY_CARE_PROVIDER_SITE_OTHER): Payer: Medicare HMO | Admitting: Family Medicine

## 2021-04-30 VITALS — BP 121/67 | HR 83 | Ht 69.0 in

## 2021-04-30 DIAGNOSIS — Z743 Need for continuous supervision: Secondary | ICD-10-CM | POA: Diagnosis not present

## 2021-04-30 DIAGNOSIS — R69 Illness, unspecified: Secondary | ICD-10-CM | POA: Diagnosis not present

## 2021-04-30 DIAGNOSIS — R609 Edema, unspecified: Secondary | ICD-10-CM | POA: Diagnosis not present

## 2021-04-30 DIAGNOSIS — G301 Alzheimer's disease with late onset: Secondary | ICD-10-CM

## 2021-04-30 DIAGNOSIS — F02C0 Dementia in other diseases classified elsewhere, severe, without behavioral disturbance, psychotic disturbance, mood disturbance, and anxiety: Secondary | ICD-10-CM

## 2021-04-30 DIAGNOSIS — F01C0 Vascular dementia, severe, without behavioral disturbance, psychotic disturbance, mood disturbance, and anxiety: Secondary | ICD-10-CM | POA: Diagnosis not present

## 2021-04-30 NOTE — Progress Notes (Signed)
BP 121/67   Pulse 83   Ht '5\' 9"'  (1.753 m)   SpO2 90%   BMI 25.25 kg/m    Subjective:   Patient ID: Joseph Byrd, male    DOB: April 19, 1934, 87 y.o.   MRN: [id number]  HPI: Joseph Byrd is a 87 y.o. male presenting on 04/30/2021 for Medical Management of Chronic Issues and Edema (LLE)   HPI Severe Alzheimer's and failure to thrive and ADLs Patient is becoming increasingly difficult with his Alzheimer's/vascular dementia and not following commands and his wife was smaller than cannot get him up and out of bed to even clean him anymore because he is not following commands to get up and out as much as he used to.  His weight has been more stable recently but he has lost a lot of weight over the past few years.  He is not eating as much.  She says is gotten the point where she cannot take care of him because she cannot help him up and even to clean him or bathe him by herself.  Patient is only slightly verbal but that does not necessarily related to anything that is being discussed.  He has basic 1 or 2 word or nods or answers briefly.  She says this is been gradual and just has increased over the past year significantly.  Left leg swelling Per wife left leg swelling has improved some but when she tries to even put him in bed sometimes he will she will catch him with his leg hanging half off the bed and that does not help improve the situation.  His right leg is better because he does not leave it hanging down.  Relevant past medical, surgical, family and social history reviewed and updated as indicated. Interim medical history since our last visit reviewed. Allergies and medications reviewed and updated.  Review of Systems  Constitutional:  Negative for chills and fever.  Eyes:  Negative for visual disturbance.  Respiratory:  Negative for shortness of breath and wheezing.   Cardiovascular:  Negative for chest pain and leg swelling.  Musculoskeletal:  Positive for gait problem.   Skin:  Negative for rash.  Neurological:  Positive for weakness.  Psychiatric/Behavioral:  Positive for confusion, decreased concentration and sleep disturbance.   All other systems reviewed and are negative.  Per HPI unless specifically indicated above   Allergies as of 04/30/2021       Reactions   Lisinopril    Patient says no drug allergies        Medication List        Accurate as of April 30, 2021 12:35 PM. If you have any questions, ask your nurse or doctor.          acetaminophen 325 MG tablet Commonly known as: TYLENOL Take 650 mg by mouth every 6 (six) hours as needed for moderate pain or headache.   amLODipine 5 MG tablet Commonly known as: NORVASC Take 1 tablet (5 mg total) by mouth daily.   aspirin EC 81 MG tablet Take 81 mg by mouth every morning.   atorvastatin 20 MG tablet Commonly known as: LIPITOR TAKE 1 TABLET BY MOUTH EVERY DAY   clopidogrel 75 MG tablet Commonly known as: PLAVIX TAKE 1 TABLET BY MOUTH EVERY DAY   donepezil 10 MG tablet Commonly known as: ARICEPT Take 10 mg by mouth at bedtime.   furosemide 20 MG tablet Commonly known as: LASIX Take 1 tablet (20 mg total) by mouth daily.  hydroxyurea 500 MG capsule Commonly known as: HYDREA TAKE 2 CAPSULES ON MONDAY, WEDNESDAY, AND FRIDAY. TAKE 1 TABLET DAILY REST OF THE WEEK   metoprolol succinate 50 MG 24 hr tablet Commonly known as: TOPROL-XL TAKE 1 TABLET (50 MG TOTAL) BY MOUTH DAILY. TAKE WITH OR IMMEDIATELY FOLLOWING A MEAL.   multivitamin with minerals Tabs tablet Take 1 tablet by mouth daily.   nitroGLYCERIN 0.4 MG SL tablet Commonly known as: NITROSTAT DISSOLVE 1 TABLET UNDER TONGUE EVERY 5 MIN AS NEEDED FOR CHEST PAIN, MAX 3 DOSES IN 15 MIN   OLANZapine zydis 10 MG disintegrating tablet Commonly known as: ZYPREXA Take 1 tablet (10 mg total) by mouth at bedtime.   SYSTANE OP Place 1 drop into both eyes daily.   tamsulosin 0.4 MG Caps capsule Commonly known  as: FLOMAX TAKE 1 CAPSULE BY MOUTH EVERY DAY   valsartan 160 MG tablet Commonly known as: DIOVAN Take 1 tablet (160 mg total) by mouth daily.         Objective:   BP 121/67   Pulse 83   Ht '5\' 9"'  (1.753 m)   SpO2 90%   BMI 25.25 kg/m   Wt Readings from Last 3 Encounters:  02/01/21 171 lb (77.6 kg)  01/31/21 171 lb (77.6 kg)  01/09/21 171 lb 3.2 oz (77.7 kg)    Physical Exam Vitals and nursing note reviewed.  Constitutional:      General: He is not in acute distress.    Appearance: He is well-developed. He is not diaphoretic.  Eyes:     General: No scleral icterus.    Conjunctiva/sclera: Conjunctivae normal.  Neck:     Thyroid: No thyromegaly.  Cardiovascular:     Rate and Rhythm: Normal rate and regular rhythm.     Heart sounds: Normal heart sounds. No murmur heard. Pulmonary:     Effort: Pulmonary effort is normal. No respiratory distress.     Breath sounds: Normal breath sounds. No wheezing.  Musculoskeletal:        General: Swelling (2+ left lower extremity edema) present. No tenderness or deformity. Normal range of motion.     Cervical back: Neck supple.  Lymphadenopathy:     Cervical: No cervical adenopathy.  Skin:    General: Skin is warm and dry.     Findings: No rash.  Neurological:     Mental Status: He is alert and oriented to person, place, and time.     Coordination: Coordination normal.  Psychiatric:        Attention and Perception: He is inattentive.        Behavior: Behavior normal.        Cognition and Memory: Memory is impaired. He exhibits impaired recent memory and impaired remote memory.      Assessment & Plan:   Problem List Items Addressed This Visit       Nervous and Auditory   Severe dementia   Relevant Orders   Ambulatory referral to Hospice   Ambulatory referral to Hospice   Vascular dementia [facility])   Relevant Orders   Ambulatory referral to Hospice   Other Visit Diagnoses     Peripheral edema    -  Primary    Relevant Orders   BMP8+EGFR   Requires continuous supervision for activities of daily living (ADL)       Relevant Orders   Ambulatory referral to Hospice       Will refer to hospice, severe dementia and she is not able to take  care of him and see if we can get some help for him.  Even if he just qualifies for palliative care. Follow up plan: Return if symptoms worsen or fail to improve, for dementia.  Counseling provided for all of the vaccine components Orders Placed This Encounter  Procedures   BMP8+EGFR   Ambulatory referral to [place], MD [place] 04/30/2021, 12:35 PM

## 2021-05-01 LAB — BMP8+EGFR
BUN/Creatinine Ratio: 23 (ref 10–24)
BUN: 26 mg/dL (ref 8–27)
CO2: 22 mmol/L (ref 20–29)
Calcium: 9.6 mg/dL (ref 8.6–10.2)
Chloride: 102 mmol/L (ref 96–106)
Creatinine, Ser: 1.15 mg/dL (ref 0.76–1.27)
Glucose: 123 mg/dL — ABNORMAL HIGH (ref 70–99)
Potassium: 3.8 mmol/L (ref 3.5–5.2)
Sodium: 144 mmol/L (ref 134–144)
eGFR: 63 mL/min/{1.73_m2} (ref >59)

## 2021-05-10 ENCOUNTER — Inpatient Hospital Stay (HOSPITAL_COMMUNITY): Payer: Medicare HMO

## 2021-05-11 ENCOUNTER — Telehealth: Payer: Self-pay | Admitting: Family Medicine

## 2021-05-11 ENCOUNTER — Emergency Department (HOSPITAL_COMMUNITY)
Admission: EM | Admit: 2021-05-11 | Discharge: 2021-06-03 | Disposition: E | Attending: Emergency Medicine | Admitting: Emergency Medicine

## 2021-05-11 DIAGNOSIS — Z87891 Personal history of nicotine dependence: Secondary | ICD-10-CM | POA: Insufficient documentation

## 2021-05-11 DIAGNOSIS — E785 Hyperlipidemia, unspecified: Secondary | ICD-10-CM | POA: Insufficient documentation

## 2021-05-11 DIAGNOSIS — I2510 Atherosclerotic heart disease of native coronary artery without angina pectoris: Secondary | ICD-10-CM | POA: Diagnosis not present

## 2021-05-11 DIAGNOSIS — F0390 Unspecified dementia without behavioral disturbance: Secondary | ICD-10-CM | POA: Insufficient documentation

## 2021-05-11 DIAGNOSIS — Z951 Presence of aortocoronary bypass graft: Secondary | ICD-10-CM | POA: Diagnosis not present

## 2021-05-11 DIAGNOSIS — I10 Essential (primary) hypertension: Secondary | ICD-10-CM | POA: Insufficient documentation

## 2021-05-11 DIAGNOSIS — E1169 Type 2 diabetes mellitus with other specified complication: Secondary | ICD-10-CM | POA: Diagnosis not present

## 2021-05-11 DIAGNOSIS — I499 Cardiac arrhythmia, unspecified: Secondary | ICD-10-CM | POA: Diagnosis not present

## 2021-05-11 DIAGNOSIS — I491 Atrial premature depolarization: Secondary | ICD-10-CM | POA: Diagnosis not present

## 2021-05-11 DIAGNOSIS — R739 Hyperglycemia, unspecified: Secondary | ICD-10-CM | POA: Diagnosis not present

## 2021-05-11 DIAGNOSIS — Z7982 Long term (current) use of aspirin: Secondary | ICD-10-CM | POA: Diagnosis not present

## 2021-05-11 DIAGNOSIS — R69 Illness, unspecified: Secondary | ICD-10-CM | POA: Diagnosis not present

## 2021-05-11 DIAGNOSIS — I441 Atrioventricular block, second degree: Secondary | ICD-10-CM | POA: Diagnosis not present

## 2021-05-11 DIAGNOSIS — Z79899 Other long term (current) drug therapy: Secondary | ICD-10-CM | POA: Insufficient documentation

## 2021-05-11 DIAGNOSIS — Z7902 Long term (current) use of antithrombotics/antiplatelets: Secondary | ICD-10-CM | POA: Insufficient documentation

## 2021-05-11 DIAGNOSIS — I469 Cardiac arrest, cause unspecified: Secondary | ICD-10-CM | POA: Diagnosis present

## 2021-05-11 DIAGNOSIS — Z743 Need for continuous supervision: Secondary | ICD-10-CM | POA: Diagnosis not present

## 2021-05-14 LAB — CBG MONITORING, ED: Glucose-Capillary: 117 mg/dL — ABNORMAL HIGH (ref 70–99)

## 2021-05-17 ENCOUNTER — Ambulatory Visit (HOSPITAL_COMMUNITY): Payer: Medicare HMO | Admitting: Physician Assistant

## 2021-06-03 NOTE — ED Provider Notes (Signed)
[facility] EMERGENCY DEPARTMENT Provider Note   CSN: [id number] Arrival date & time: 2021-06-05  1438     History No chief complaint on file.   Joseph Byrd is a 88 y.o. male.  Patient lives at home with wife.  Approximately 10 minutes prior to 1330 patient was found unresponsive by the wife.  Not clear whether he had a pulse or breathing at that time.  When fire arrived at 1330 they started CPR.  Patient was not breathing had no pulse.  Initial rhythm was asystole.  EMS placed a [facility] airway continued CPR.  Patient received a total of 5 mg of epinephrine.  At 1 point on the way here patient had a rhythm consistent with V. tach he was shocked I had return of spontaneous pulse.  But lost that in about 8 minutes.  Patient never did wake up.  Patient received epinephrine after that without any change.  The rhythm that they got after that was pulseless electrical activity.   With significant past medical history to include coronary disease status post CABG in 2020.  Hyperlipidemia hypertension left bundle branch block.  Peripheral arterial disease.  No had vascular mention listed.  It was stated to be severe.  And type 2 diabetes.      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   Tremor 08/19/2019   Vascular dementia ([place]) 08/19/2019   S/P CABG x 4 05/19/2019   Severe dementia 09/21/2018   Claudication in peripheral vascular disease ([place])  07/06/2018   Angina pectoris ([place]) 03/19/2018   Overweight (BMI 25.0-29.9) 12/18/2017   Anemia 12/16/2016   Vitamin D deficiency 08/30/2014   Postsurgical percutaneous transluminal coronary angioplasty (PTCA) status 04/26/2014   Cataract    Arthritis of both knees 01/05/2013   Hyperglycemia 01/05/2013   Essential hypertension, benign 08/22/2010   CAD, multiple vessel 08/22/2010   Hyperlipidemia with target LDL less than 70 08/22/2010    Past Surgical History:  Procedure Laterality Date   BACK SURGERY     CATARACT EXTRACTION W/ INTRAOCULAR LENS  IMPLANT, BILATERAL Bilateral ~ 2014   CORONARY ANGIOPLASTY WITH STENT PLACEMENT  08/2010; 04/26/2014   "1, LAD; 1"   CORONARY ARTERY BYPASS GRAFT N/A 05/19/2019   Procedure: CORONARY ARTERY BYPASS GRAFTING (CABG), on pump, times four using left internal mammary artery and right greater saphenous vein harvested endoscopically;  Surgeon: Lajuana Matte, MD;  Location: [place];  Service: Open Heart Surgery;  Laterality: N/A;   LEFT HEART CATH AND CORONARY ANGIOGRAPHY N/A 05/18/2019   Procedure: LEFT HEART CATH AND CORONARY ANGIOGRAPHY;  Surgeon: Adrian Prows, MD;  Location: [place] CV LAB;  Service: Cardiovascular;  Laterality: N/A;   LEFT HEART CATHETERIZATION WITH CORONARY ANGIOGRAM N/A 04/26/2014   Procedure: LEFT HEART CATHETERIZATION WITH CORONARY ANGIOGRAM;  Surgeon: Laverda Page, MD;  Location: [facility] CATH  LAB;  Service: Cardiovascular;  Laterality: N/A;   LUMBAR LAMINECTOMY/DECOMPRESSION MICRODISCECTOMY  01/2010       Family History  Problem Relation Age of Onset   Cancer Sister     Social History   Tobacco Use   Smoking status: Former    Packs/day: 0.12    Years: 10.00    Pack years: 1.20    Types: Cigarettes    Quit date: 09/04/1982    Years since quitting: 38.7   Smokeless tobacco: Never  Vaping Use   Vaping Use: Never used  Substance Use Topics   Alcohol use: No   Drug use: No    Home Medications Prior to  Admission medications   Medication Sig Start Date End Date Taking? Authorizing Provider  acetaminophen (TYLENOL) 325 MG tablet Take 650 mg by mouth every 6 (six) hours as needed for moderate pain or headache.    [provider]  amLODipine (NORVASC) 5 MG tablet Take 1 tablet (5 mg total) by mouth daily. 04/23/21   Hassell Done, Mary-Margaret, FNP  aspirin EC 81 MG tablet Take 81 mg by mouth every morning.    [provider]  atorvastatin (LIPITOR) 20 MG tablet TAKE 1 TABLET BY MOUTH EVERY DAY 02/07/21   Dettinger, [name], MD  clopidogrel (PLAVIX) 75 MG tablet TAKE 1 TABLET BY MOUTH EVERY DAY 03/20/20   Adrian Prows, MD  donepezil (ARICEPT) 10 MG tablet Take 10 mg by mouth at bedtime.     [provider]  furosemide (LASIX) 20 MG tablet Take 1 tablet (20 mg total) by mouth daily. 04/23/21   Hassell Done, Mary-Margaret, FNP  hydroxyurea (HYDREA) 500 MG capsule TAKE 2 CAPSULES ON MONDAY, WEDNESDAY, AND FRIDAY. TAKE 1 TABLET DAILY REST OF THE WEEK 02/12/21   Derek Jack, MD  metoprolol succinate (TOPROL-XL) 50 MG 24 hr tablet TAKE 1 TABLET (50 MG TOTAL) BY MOUTH DAILY. TAKE WITH OR IMMEDIATELY FOLLOWING A MEAL. 02/12/21 02/07/22  Adrian Prows, MD  Multiple Vitamin (MULTIVITAMIN WITH MINERALS) TABS tablet Take 1 tablet by mouth daily.    [provider]  nitroGLYCERIN (NITROSTAT) 0.4 MG SL tablet DISSOLVE 1 TABLET UNDER TONGUE EVERY 5 MIN AS NEEDED FOR CHEST PAIN, MAX 3 DOSES IN 15 MIN 01/01/19   Adrian Prows, MD  OLANZapine zydis (ZYPREXA) 10 MG disintegrating tablet Take 1 tablet (10 mg total) by mouth at bedtime. 01/31/21   Dettinger, [name], MD  Polyethyl Glycol-Propyl Glycol (SYSTANE OP) Place 1 drop into both eyes daily.    [provider]  tamsulosin (FLOMAX) 0.4 MG CAPS capsule TAKE 1 CAPSULE BY MOUTH EVERY DAY 03/23/21   Dettinger, [name], MD  valsartan (DIOVAN) 160 MG tablet Take 1 tablet (160 mg total) by mouth daily. 04/23/21   Hassell Done Mary-Margaret, FNP     Allergies    Lisinopril  Review of Systems   Review of Systems  Unable to perform ROS: Patient unresponsive   Physical Exam Updated Vital Signs There were no vitals taken for this visit.  Physical Exam Constitutional:      General: He is in acute distress.     Appearance: He is ill-appearing. He is not diaphoretic.     Comments: Patient completely unresponsive ongoing CPR  HENT:     Head: Normocephalic and atraumatic.     Comments: King airway in place Eyes:     Comments: Pupils 2 mm nonreactive.  No corneal reflex  Cardiovascular:     Comments: Pulse only with CPR Pulmonary:  Comments: Breath sounds only with bagging via the [facility] airway but equal breath sounds bilaterally Abdominal:     General: There is no distension.  Musculoskeletal:     Cervical back: Neck supple.     Comments: I/O left tibia  Neurological:     Comments: No movement at all no spontaneous breathing corneal reflexes not present pupils 2 mm nonreactive    ED Results / Procedures / Treatments   Labs (all labs ordered are listed, but only abnormal results are displayed) Labs Reviewed - No data to display  EKG None  Radiology No results found.  Procedures Procedures   Medications Ordered in ED Medications - No data to display  ED Course  I have reviewed the triage vital signs and the nursing notes.  Pertinent labs & imaging results that were available during my care of the patient were reviewed by me and considered in my medical decision making (see chart for details).    MDM Rules/Calculators/A&P                         CRITICAL CARE Performed by: Fredia Sorrow Total critical care time: 35 minutes Critical care time was exclusive of separately billable procedures and treating other patients. Critical care was necessary to treat or prevent imminent or life-threatening deterioration. Critical care was time spent personally by me on the following activities: development of  treatment plan with patient and/or surrogate as well as nursing, discussions with consultants, evaluation of patient's response to treatment, examination of patient, obtaining history from patient or surrogate, ordering and performing treatments and interventions, ordering and review of laboratory studies, ordering and review of radiographic studies, pulse oximetry and re-evaluation of patient's condition.  Patient brought in by EMS with ongoing CPR.  Patient had a 911 call for being unresponsive at 1330.  Firefighters started CPR at the scene.  EMS gave a total of 5 mg of epinephrine.  At 1 point he went into V. tach that shocked him and they got pulses back but he did not wake up.  Only had the pulses for about 8 minutes.  He received epinephrine after that.  Remained in PEA.  But very bradycardic with that her heartbeat was around 30 bpm or less.  Patient's corneal reflex was nonreactive pupils were 2 mm and nonreactive.  We continued CPR here.  Patient had King airway in place good breath sounds on both sides.  We had good pulses with CPR.  Had stopped CPR twice had the very slow heart rate PEA and due to the prolongation patient arrived here shortly before 1430.  Patient was pronounced at 1446.  Discussed with the medical examiner Lewis will not be an ME case.  Final Clinical Impression(s) / ED Diagnoses Final diagnoses:  Cardiac arrest [facility])    Rx / DC Orders ED Discharge Orders     None        Fredia Sorrow, MD May 30, 2021 1510

## 2021-06-03 NOTE — ED Triage Notes (Signed)
Patient in the restroom and wife found him unresponsive.  CPR initiated by fire at 1330 EMS started at 1345 acheieved ROSC at 1411 lost pulses again at 1419 achieved ROSC 1432.  Received 5 epis in field and one defib.  Patient initially in Asystole-PEA-Vtach-PEA-Asystole

## 2021-06-03 NOTE — ED Notes (Signed)
Family at bedside. 

## 2021-06-03 NOTE — ED Notes (Signed)
[facility] notified referral # [id number] Spoke with Marybelle Killings patient not suitable for donation.

## 2021-06-03 NOTE — Code Documentation (Signed)
CBG 117 

## 2021-06-03 DEATH — deceased

## 2021-08-02 ENCOUNTER — Ambulatory Visit: Payer: Medicare HMO | Admitting: Family Medicine

## 2021-08-18 IMAGING — CR DG CHEST 2V
2 series · 2 of 2 positions shown · non-contrast
Comparison: Portable chest 05/23/2019 and earlier.

CLINICAL DATA: 85-year-old male status post CABG in [REDACTED].

EXAM:
CHEST - 2 VIEW

[w chest pa]
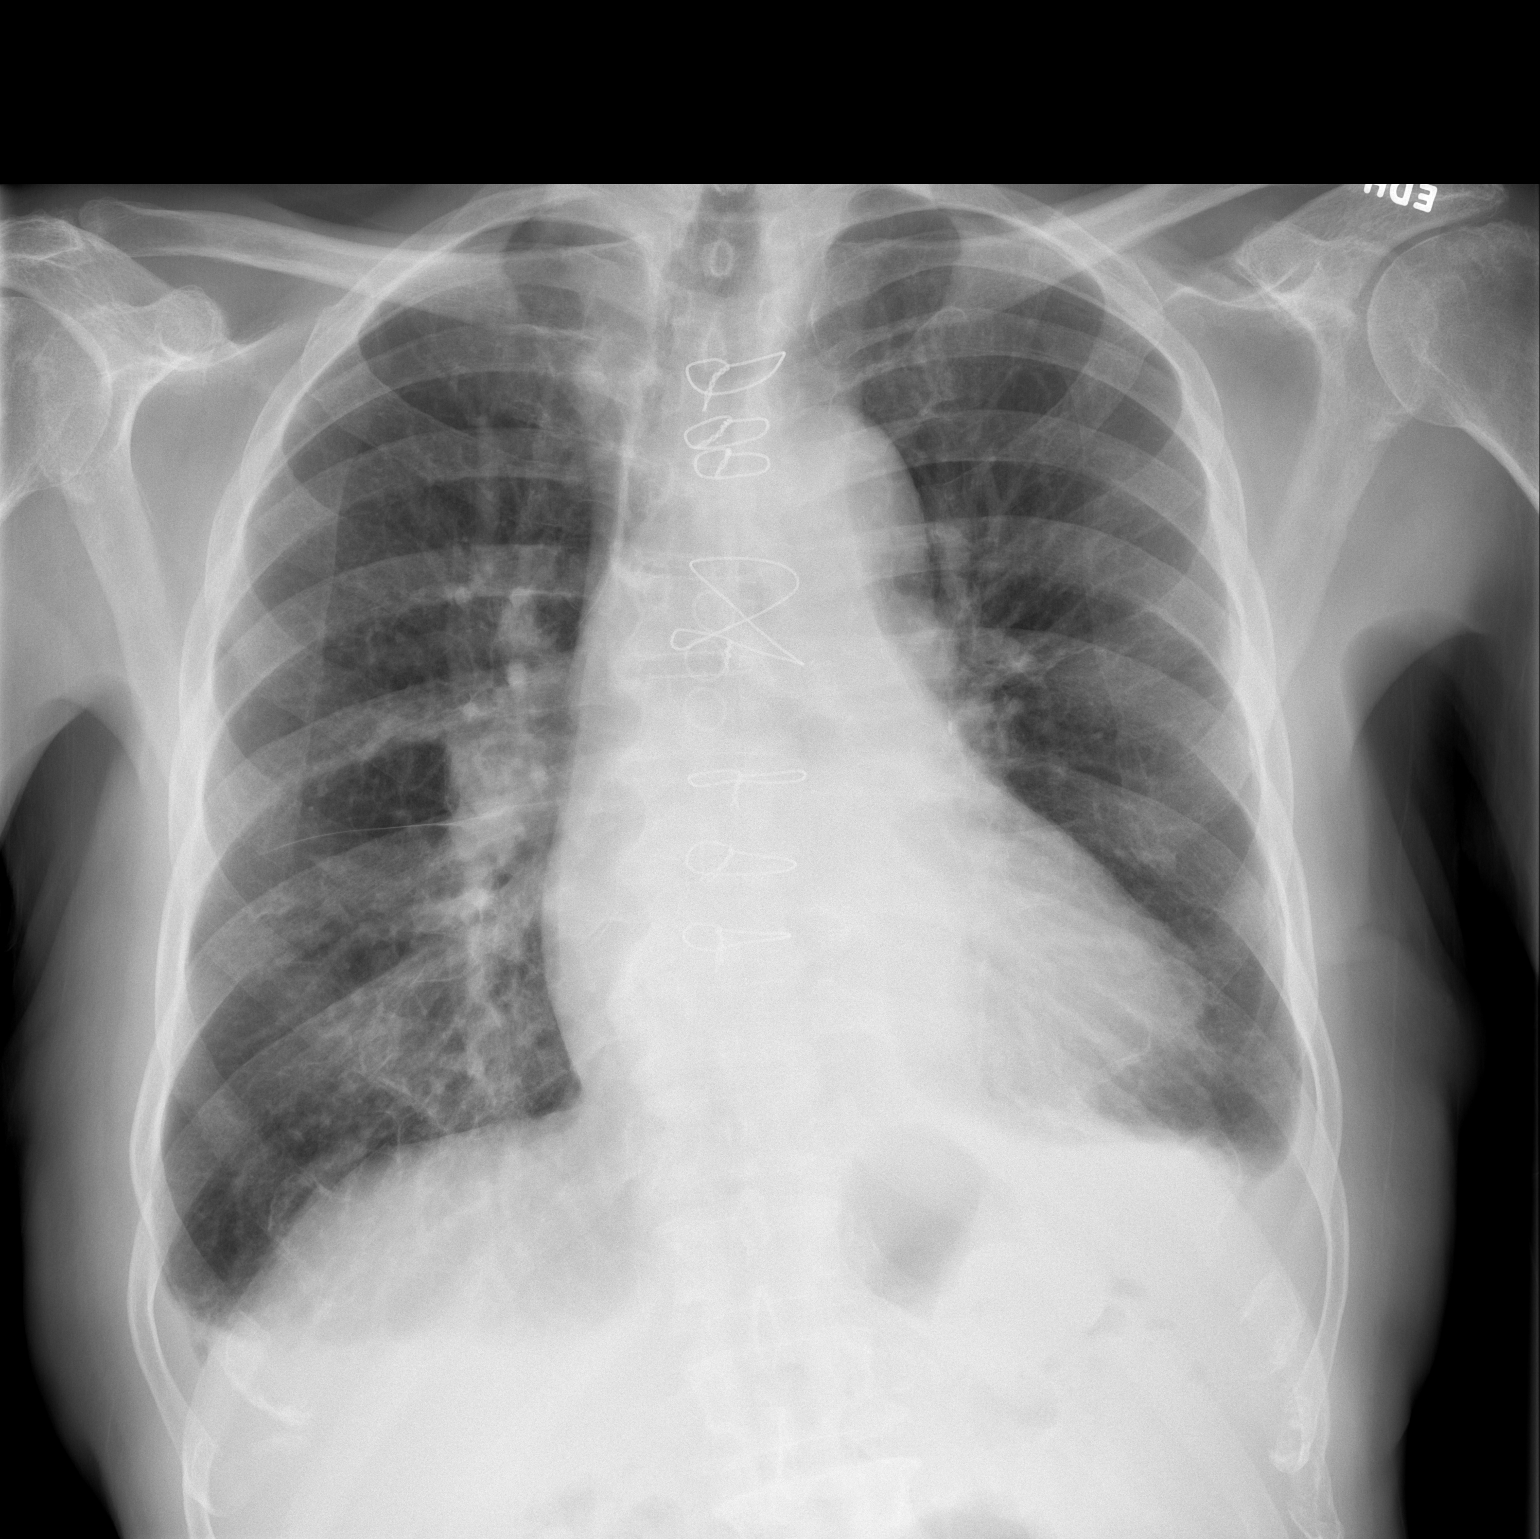

[w chest lat]
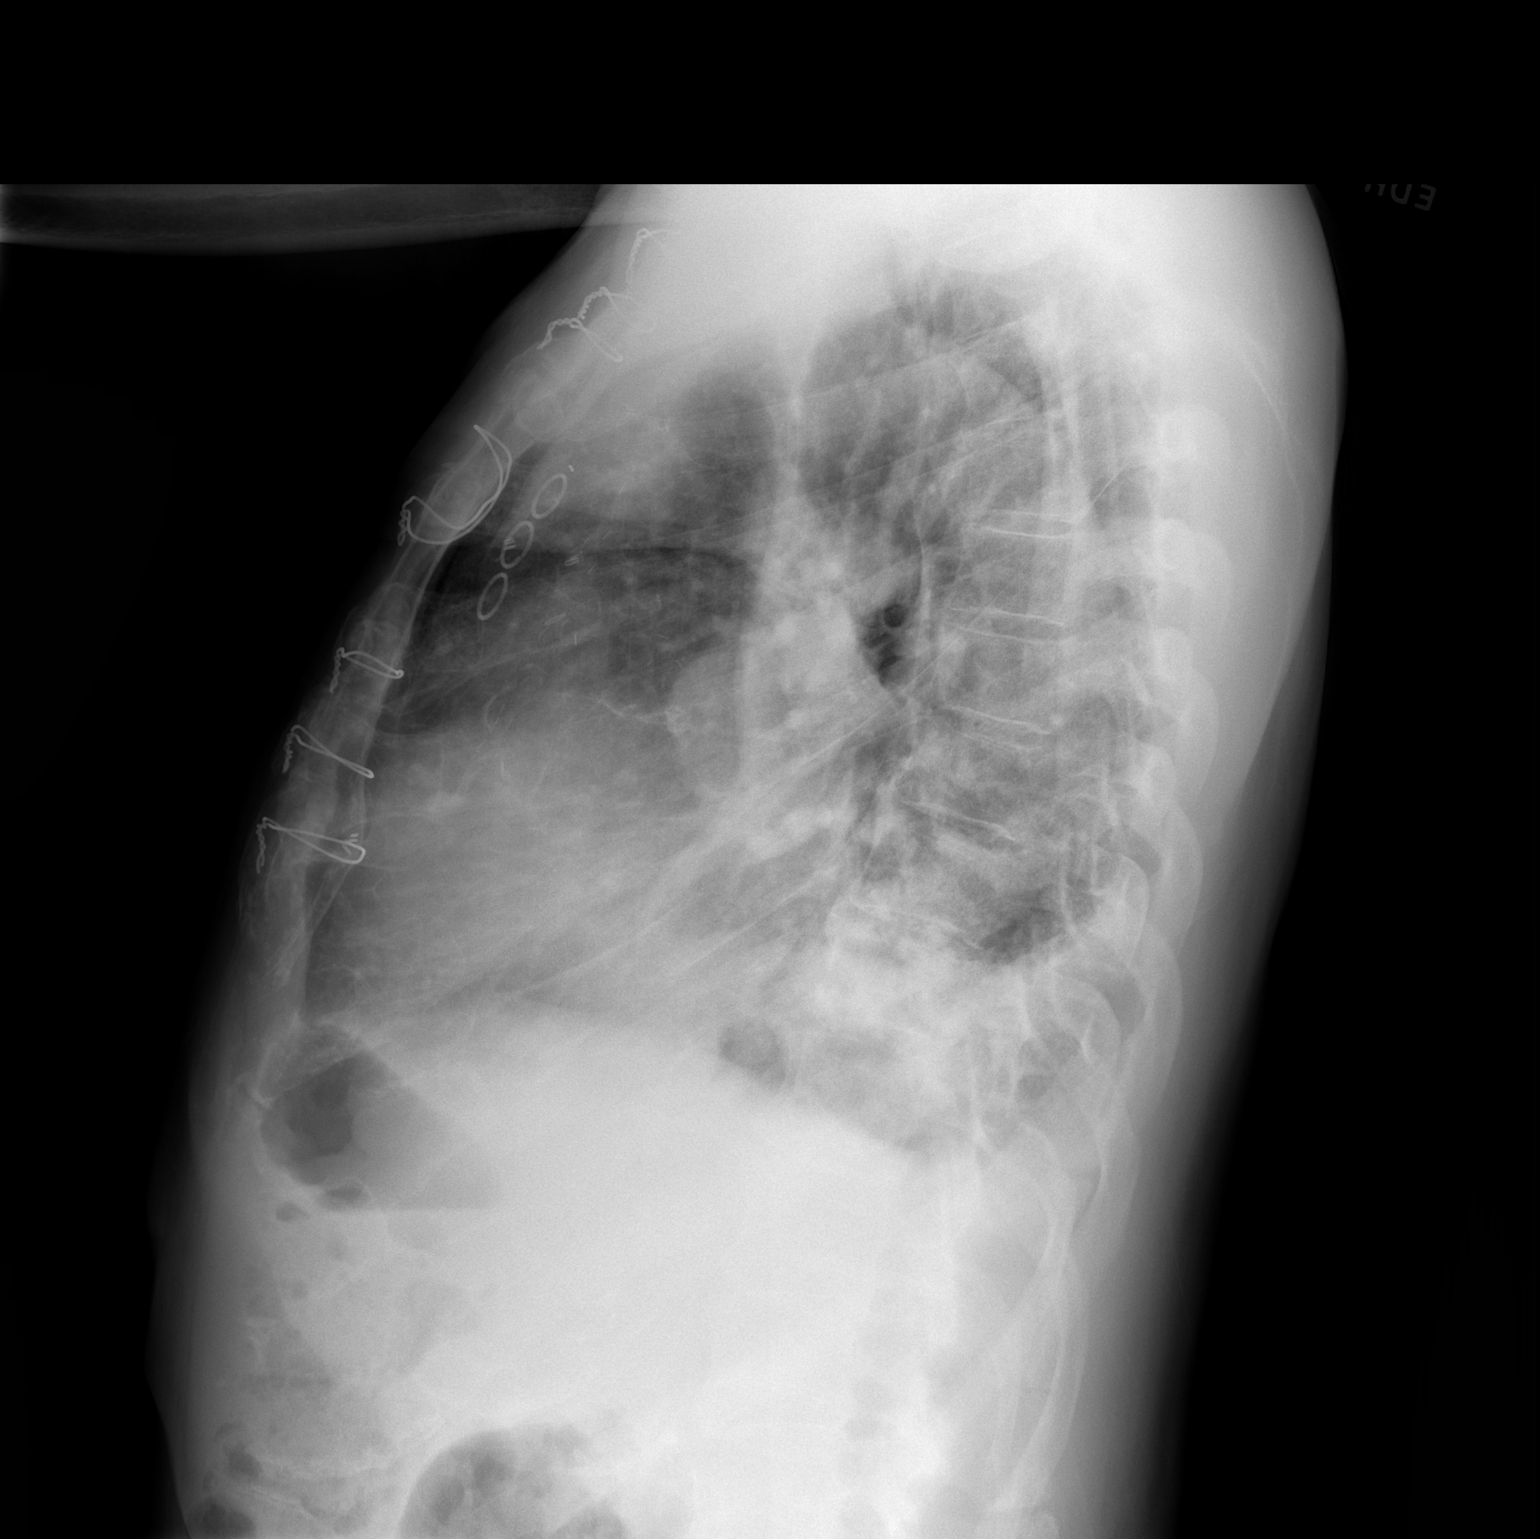

[2 of 2 positions shown; findings below may reference images not displayed]

FINDINGS: PA and lateral views today. Small left pleural effusion. Trace right
pleural effusion is possible. Improved lung volumes since [REDACTED].
No pneumothorax or pulmonary edema. Mild cardiomegaly. Stable
cardiac size and mediastinal contours. Sequelae of CABG. No acute
osseous abnormality identified. Negative visible bowel gas pattern.
IMPRESSION: 1. Small left and probable trace right pleural effusions.
2. Improved lung volumes with no pulmonary edema.
3. Mild cardiomegaly.

## 2022-01-01 ENCOUNTER — Ambulatory Visit: Payer: Medicare HMO | Admitting: Cardiology
